# Patient Record
Sex: Female | Born: 1975
Health system: Southern US, Community
[De-identification: ages and names within clinical notes are randomized; demographics above are authoritative.]

## PROBLEM LIST (undated history)

## (undated) DIAGNOSIS — R079 Chest pain, unspecified: Secondary | ICD-10-CM

## (undated) DIAGNOSIS — R002 Palpitations: Secondary | ICD-10-CM

## (undated) DIAGNOSIS — Z8719 Personal history of other diseases of the digestive system: Secondary | ICD-10-CM

## (undated) DIAGNOSIS — R569 Unspecified convulsions: Secondary | ICD-10-CM

## (undated) DIAGNOSIS — I341 Nonrheumatic mitral (valve) prolapse: Secondary | ICD-10-CM

## (undated) DIAGNOSIS — R011 Cardiac murmur, unspecified: Secondary | ICD-10-CM

## (undated) DIAGNOSIS — Z8711 Personal history of peptic ulcer disease: Secondary | ICD-10-CM

## (undated) HISTORY — PX: TONSILLECTOMY: SUR1361

## (undated) HISTORY — DX: Personal history of other diseases of the digestive system: Z87.19

## (undated) HISTORY — DX: Palpitations: R00.2

## (undated) HISTORY — DX: Cardiac murmur, unspecified: R01.1

## (undated) HISTORY — PX: TONSILECTOMY, ADENOIDECTOMY, BILATERAL MYRINGOTOMY AND TUBES: SHX2538

## (undated) HISTORY — DX: Personal history of peptic ulcer disease: Z87.11

## (undated) HISTORY — DX: Unspecified convulsions: R56.9

## (undated) HISTORY — DX: Nonrheumatic mitral (valve) prolapse: I34.1

## (undated) HISTORY — DX: Chest pain, unspecified: R07.9

---

## 2004-09-17 ENCOUNTER — Emergency Department (HOSPITAL_COMMUNITY): Admission: EM | Admit: 2004-09-17 | Discharge: 2004-09-17 | Payer: Self-pay | Admitting: Emergency Medicine

## 2005-12-22 ENCOUNTER — Emergency Department (HOSPITAL_COMMUNITY): Admission: EM | Admit: 2005-12-22 | Discharge: 2005-12-22 | Payer: Self-pay | Admitting: Family Medicine

## 2006-05-11 ENCOUNTER — Encounter: Admission: RE | Admit: 2006-05-11 | Discharge: 2006-05-11 | Payer: Self-pay | Admitting: Family Medicine

## 2006-08-16 ENCOUNTER — Ambulatory Visit: Payer: Self-pay | Admitting: Obstetrics & Gynecology

## 2007-05-17 ENCOUNTER — Encounter: Admission: RE | Admit: 2007-05-17 | Discharge: 2007-05-17 | Payer: Self-pay | Admitting: Internal Medicine

## 2007-05-24 ENCOUNTER — Encounter: Admission: RE | Admit: 2007-05-24 | Discharge: 2007-05-24 | Payer: Self-pay | Admitting: Internal Medicine

## 2007-06-15 ENCOUNTER — Encounter (INDEPENDENT_AMBULATORY_CARE_PROVIDER_SITE_OTHER): Payer: Self-pay | Admitting: Diagnostic Radiology

## 2007-06-15 ENCOUNTER — Encounter: Admission: RE | Admit: 2007-06-15 | Discharge: 2007-06-15 | Payer: Self-pay | Admitting: Internal Medicine

## 2007-06-27 ENCOUNTER — Ambulatory Visit: Payer: Self-pay | Admitting: Obstetrics & Gynecology

## 2007-06-27 ENCOUNTER — Other Ambulatory Visit: Admission: RE | Admit: 2007-06-27 | Discharge: 2007-06-27 | Payer: Self-pay | Admitting: Obstetrics and Gynecology

## 2007-07-09 ENCOUNTER — Inpatient Hospital Stay (HOSPITAL_COMMUNITY): Admission: AD | Admit: 2007-07-09 | Discharge: 2007-07-09 | Payer: Self-pay | Admitting: Obstetrics and Gynecology

## 2007-07-09 ENCOUNTER — Ambulatory Visit: Payer: Self-pay | Admitting: Physician Assistant

## 2009-10-05 ENCOUNTER — Emergency Department (HOSPITAL_COMMUNITY): Admission: EM | Admit: 2009-10-05 | Discharge: 2009-10-05 | Payer: Self-pay | Admitting: Emergency Medicine

## 2010-02-06 ENCOUNTER — Emergency Department: Payer: Self-pay | Admitting: Unknown Physician Specialty

## 2010-03-21 ENCOUNTER — Encounter: Payer: Self-pay | Admitting: *Deleted

## 2010-05-03 ENCOUNTER — Emergency Department (HOSPITAL_COMMUNITY)
Admission: EM | Admit: 2010-05-03 | Discharge: 2010-05-03 | Disposition: A | Discharge status: Home / Self Care | Payer: 59 | Attending: Emergency Medicine | Admitting: Emergency Medicine

## 2010-05-03 DIAGNOSIS — R002 Palpitations: Secondary | ICD-10-CM | POA: Insufficient documentation

## 2010-05-03 DIAGNOSIS — R0989 Other specified symptoms and signs involving the circulatory and respiratory systems: Secondary | ICD-10-CM | POA: Insufficient documentation

## 2010-05-03 DIAGNOSIS — R0609 Other forms of dyspnea: Secondary | ICD-10-CM | POA: Insufficient documentation

## 2010-05-03 DIAGNOSIS — I059 Rheumatic mitral valve disease, unspecified: Secondary | ICD-10-CM | POA: Insufficient documentation

## 2010-05-03 LAB — POCT CARDIAC MARKERS
CKMB, poc: <1 ng/mL — ABNORMAL LOW (ref 1.0–8.0)
Myoglobin, poc: 35.6 ng/mL (ref 12–200)

## 2010-05-03 LAB — POCT I-STAT, CHEM 8
BUN: 9 mg/dL (ref 6–23)
Creatinine, Ser: 0.9 mg/dL (ref 0.4–1.2)
Glucose, Bld: 86 mg/dL (ref 70–99)
Potassium: 4 mEq/L (ref 3.5–5.1)
Sodium: 139 mEq/L (ref 135–145)
TCO2: 23 mmol/L (ref 0–100)

## 2010-05-14 LAB — POCT CARDIAC MARKERS
CKMB, poc: <1 ng/mL — ABNORMAL LOW (ref 1.0–8.0)
CKMB, poc: <1 ng/mL — ABNORMAL LOW (ref 1.0–8.0)
Myoglobin, poc: 58.5 ng/mL (ref 12–200)

## 2010-05-14 LAB — POCT I-STAT, CHEM 8
HCT: 40 % (ref 36.0–46.0)
Hemoglobin: 13.6 g/dL (ref 12.0–15.0)
Potassium: 3.8 mEq/L (ref 3.5–5.1)
Sodium: 140 mEq/L (ref 135–145)

## 2010-05-14 LAB — DIFFERENTIAL
Lymphocytes Relative: 25 % (ref 12–46)
Lymphs Abs: 1.9 10*3/uL (ref 0.7–4.0)
Neutrophils Relative %: 64 % (ref 43–77)

## 2010-05-14 LAB — CBC
Hemoglobin: 13.1 g/dL (ref 12.0–15.0)
Platelets: 233 10*3/uL (ref 150–400)
RBC: 4.3 MIL/uL (ref 3.87–5.11)
WBC: 7.4 10*3/uL (ref 4.0–10.5)

## 2010-05-14 LAB — D-DIMER, QUANTITATIVE: D-Dimer, Quant: 0.36 ug/mL-FEU (ref 0.00–0.48)

## 2010-05-14 LAB — PROTIME-INR
INR: 1.06 (ref 0.00–1.49)
Prothrombin Time: 14 seconds (ref 11.6–15.2)

## 2010-05-31 ENCOUNTER — Institutional Professional Consult (permissible substitution): Payer: Self-pay | Admitting: Cardiovascular Disease

## 2010-05-31 ENCOUNTER — Encounter: Payer: Self-pay | Admitting: *Deleted

## 2010-05-31 ENCOUNTER — Other Ambulatory Visit: Payer: Self-pay | Admitting: Cardiovascular Disease

## 2010-05-31 DIAGNOSIS — I10 Essential (primary) hypertension: Secondary | ICD-10-CM

## 2010-05-31 MED ORDER — PROPRANOLOL HCL 10 MG PO TABS: 10.0000 mg | ORAL_TABLET | Freq: Three times a day (TID) | ORAL | Status: DC | PRN

## 2010-05-31 NOTE — Telephone Encounter (Signed)
Rescheduled appt for 06/07/2010, needs script for propranolol 10mg  QID.

## 2010-05-31 NOTE — Telephone Encounter (Signed)
Patient request refill. Med refilled.

## 2010-06-03 ENCOUNTER — Encounter: Payer: Self-pay | Admitting: Cardiovascular Disease

## 2010-06-07 ENCOUNTER — Institutional Professional Consult (permissible substitution): Payer: Self-pay | Admitting: Cardiovascular Disease

## 2010-06-30 ENCOUNTER — Emergency Department (INDEPENDENT_AMBULATORY_CARE_PROVIDER_SITE_OTHER): Payer: Self-pay

## 2010-06-30 ENCOUNTER — Emergency Department (HOSPITAL_BASED_OUTPATIENT_CLINIC_OR_DEPARTMENT_OTHER): Payer: Self-pay

## 2010-06-30 ENCOUNTER — Emergency Department (HOSPITAL_BASED_OUTPATIENT_CLINIC_OR_DEPARTMENT_OTHER)
Admission: EM | Admit: 2010-06-30 | Discharge: 2010-06-30 | Disposition: A | Discharge status: Home / Self Care | Payer: Self-pay | Attending: Emergency Medicine | Admitting: Emergency Medicine

## 2010-06-30 DIAGNOSIS — M542 Cervicalgia: Secondary | ICD-10-CM

## 2010-06-30 DIAGNOSIS — R51 Headache: Secondary | ICD-10-CM

## 2010-06-30 DIAGNOSIS — Y92410 Unspecified street and highway as the place of occurrence of the external cause: Secondary | ICD-10-CM | POA: Insufficient documentation

## 2010-06-30 DIAGNOSIS — S139XXA Sprain of joints and ligaments of unspecified parts of neck, initial encounter: Secondary | ICD-10-CM | POA: Insufficient documentation

## 2010-06-30 DIAGNOSIS — S060X0A Concussion without loss of consciousness, initial encounter: Secondary | ICD-10-CM | POA: Insufficient documentation

## 2010-06-30 DIAGNOSIS — R42 Dizziness and giddiness: Secondary | ICD-10-CM

## 2010-07-22 ENCOUNTER — Emergency Department (HOSPITAL_COMMUNITY)
Admission: EM | Admit: 2010-07-22 | Discharge: 2010-07-22 | Disposition: A | Discharge status: Home / Self Care | Payer: 59 | Attending: Emergency Medicine | Admitting: Emergency Medicine

## 2010-07-22 ENCOUNTER — Telehealth: Payer: Self-pay | Admitting: Cardiovascular Disease

## 2010-07-22 ENCOUNTER — Emergency Department (HOSPITAL_COMMUNITY): Payer: 59

## 2010-07-22 DIAGNOSIS — Z79899 Other long term (current) drug therapy: Secondary | ICD-10-CM | POA: Insufficient documentation

## 2010-07-22 DIAGNOSIS — R55 Syncope and collapse: Secondary | ICD-10-CM | POA: Insufficient documentation

## 2010-07-22 DIAGNOSIS — R51 Headache: Secondary | ICD-10-CM | POA: Insufficient documentation

## 2010-07-22 DIAGNOSIS — F0781 Postconcussional syndrome: Secondary | ICD-10-CM | POA: Insufficient documentation

## 2010-07-22 DIAGNOSIS — H53149 Visual discomfort, unspecified: Secondary | ICD-10-CM | POA: Insufficient documentation

## 2010-07-22 DIAGNOSIS — Z7982 Long term (current) use of aspirin: Secondary | ICD-10-CM | POA: Insufficient documentation

## 2010-07-22 DIAGNOSIS — R112 Nausea with vomiting, unspecified: Secondary | ICD-10-CM | POA: Insufficient documentation

## 2010-07-22 LAB — URINALYSIS, ROUTINE W REFLEX MICROSCOPIC
Bilirubin Urine: NEGATIVE
Glucose, UA: NEGATIVE mg/dL
Hgb urine dipstick: NEGATIVE
Specific Gravity, Urine: 1.016 (ref 1.005–1.030)
Urobilinogen, UA: 1 mg/dL (ref 0.0–1.0)

## 2010-07-22 LAB — POCT I-STAT, CHEM 8
Calcium, Ion: 1.15 mmol/L (ref 1.12–1.32)
Chloride: 103 mEq/L (ref 96–112)
Glucose, Bld: 86 mg/dL (ref 70–99)
HCT: 39 % (ref 36.0–46.0)
Hemoglobin: 13.3 g/dL (ref 12.0–15.0)

## 2010-07-22 NOTE — Telephone Encounter (Signed)
PT CALLED SAYING NOT FEELING RIGHT, A BIT PARANOID....PASSED THE CALL ALONG TO JODETTE. SHE IS SPEAKING WITH PATIENT NOW. ALSO TOOK HER THE CHART.
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
## 2010-07-27 ENCOUNTER — Emergency Department (HOSPITAL_COMMUNITY)
Admission: EM | Admit: 2010-07-27 | Discharge: 2010-07-27 | Disposition: A | Discharge status: Home / Self Care | Payer: 59 | Attending: Emergency Medicine | Admitting: Emergency Medicine

## 2010-07-27 DIAGNOSIS — R42 Dizziness and giddiness: Secondary | ICD-10-CM | POA: Insufficient documentation

## 2010-07-27 DIAGNOSIS — R51 Headache: Secondary | ICD-10-CM | POA: Insufficient documentation

## 2010-07-27 DIAGNOSIS — R413 Other amnesia: Secondary | ICD-10-CM | POA: Insufficient documentation

## 2010-07-27 DIAGNOSIS — R110 Nausea: Secondary | ICD-10-CM | POA: Insufficient documentation

## 2010-07-27 DIAGNOSIS — F0781 Postconcussional syndrome: Secondary | ICD-10-CM | POA: Insufficient documentation

## 2010-08-23 ENCOUNTER — Encounter: Payer: Self-pay | Admitting: Neurology

## 2010-08-29 ENCOUNTER — Encounter: Payer: Self-pay | Admitting: Neurology

## 2010-09-29 ENCOUNTER — Encounter: Payer: Self-pay | Admitting: Neurology

## 2010-11-23 LAB — POCT PREGNANCY, URINE: Operator id: 159681

## 2011-02-23 ENCOUNTER — Ambulatory Visit (INDEPENDENT_AMBULATORY_CARE_PROVIDER_SITE_OTHER): Payer: Commercial Managed Care - PPO

## 2011-02-23 DIAGNOSIS — R509 Fever, unspecified: Secondary | ICD-10-CM

## 2011-02-23 DIAGNOSIS — R059 Cough, unspecified: Secondary | ICD-10-CM

## 2011-02-23 DIAGNOSIS — J111 Influenza due to unidentified influenza virus with other respiratory manifestations: Secondary | ICD-10-CM

## 2011-02-23 DIAGNOSIS — R05 Cough: Secondary | ICD-10-CM

## 2011-03-02 ENCOUNTER — Ambulatory Visit (INDEPENDENT_AMBULATORY_CARE_PROVIDER_SITE_OTHER): Payer: Commercial Managed Care - PPO

## 2011-03-02 DIAGNOSIS — R042 Hemoptysis: Secondary | ICD-10-CM

## 2011-03-02 DIAGNOSIS — F17200 Nicotine dependence, unspecified, uncomplicated: Secondary | ICD-10-CM

## 2011-03-02 DIAGNOSIS — J209 Acute bronchitis, unspecified: Secondary | ICD-10-CM

## 2011-08-11 ENCOUNTER — Encounter: Payer: Self-pay | Admitting: *Deleted

## 2011-10-29 ENCOUNTER — Ambulatory Visit (INDEPENDENT_AMBULATORY_CARE_PROVIDER_SITE_OTHER): Payer: BC Managed Care – PPO | Admitting: Internal Medicine

## 2011-10-29 VITALS — BP 105/69 | HR 64 | Temp 98.6°F | Resp 18 | Ht 67.5 in | Wt 147.0 lb

## 2011-10-29 DIAGNOSIS — M2550 Pain in unspecified joint: Secondary | ICD-10-CM

## 2011-10-29 DIAGNOSIS — R5381 Other malaise: Secondary | ICD-10-CM

## 2011-10-29 DIAGNOSIS — G2581 Restless legs syndrome: Secondary | ICD-10-CM

## 2011-10-29 DIAGNOSIS — I10 Essential (primary) hypertension: Secondary | ICD-10-CM

## 2011-10-29 DIAGNOSIS — R5383 Other fatigue: Secondary | ICD-10-CM

## 2011-10-29 DIAGNOSIS — IMO0001 Reserved for inherently not codable concepts without codable children: Secondary | ICD-10-CM

## 2011-10-29 DIAGNOSIS — M791 Myalgia, unspecified site: Secondary | ICD-10-CM

## 2011-10-29 DIAGNOSIS — G4700 Insomnia, unspecified: Secondary | ICD-10-CM

## 2011-10-29 DIAGNOSIS — I341 Nonrheumatic mitral (valve) prolapse: Secondary | ICD-10-CM

## 2011-10-29 LAB — POCT CBC
Granulocyte percent: 66.5 %G (ref 37–80)
Lymph, poc: 1.7 (ref 0.6–3.4)
MCH, POC: 27.9 pg (ref 27–31.2)
MCHC: 29.9 g/dL — AB (ref 31.8–35.4)
MCV: 93.4 fL (ref 80–97)
MID (cbc): 0.4 (ref 0–0.9)
POC LYMPH PERCENT: 27.1 %L (ref 10–50)
Platelet Count, POC: 254 10*3/uL (ref 142–424)
RDW, POC: 13.9 %

## 2011-10-29 LAB — POCT UA - MICROSCOPIC ONLY
Casts, Ur, LPF, POC: NEGATIVE
Crystals, Ur, HPF, POC: NEGATIVE
Yeast, UA: NEGATIVE

## 2011-10-29 LAB — COMPREHENSIVE METABOLIC PANEL
ALT: 23 U/L (ref 0–35)
AST: 16 U/L (ref 0–37)
BUN: 11 mg/dL (ref 6–23)
Calcium: 9.3 mg/dL (ref 8.4–10.5)
Creat: 0.81 mg/dL (ref 0.50–1.10)
Total Bilirubin: 0.4 mg/dL (ref 0.3–1.2)

## 2011-10-29 LAB — POCT URINALYSIS DIPSTICK
Bilirubin, UA: NEGATIVE
Blood, UA: NEGATIVE
Ketones, UA: NEGATIVE
Protein, UA: NEGATIVE
Spec Grav, UA: 1.015
pH, UA: 8

## 2011-10-29 LAB — TSH: TSH: 0.578 u[IU]/mL (ref 0.350–4.500)

## 2011-10-29 MED ORDER — PROPRANOLOL HCL 10 MG PO TABS: 10.0000 mg | ORAL_TABLET | Freq: Four times a day (QID) | ORAL | Status: DC

## 2011-10-29 MED ORDER — CLONAZEPAM 0.5 MG PO TABS: 0.5000 mg | ORAL_TABLET | Freq: Every evening | ORAL | Status: DC | PRN

## 2011-10-29 NOTE — Progress Notes (Signed)
Subjective:    Patient ID: Kathryn Brown, female    DOB: 01-Jan-1976, 36 y.o.   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-hour job and she cannot cut back in any way Toss and turn- restless legs-wakes nonrestored/No snoring or apnea Lightheadedness/No syncope/uncertain of postural changes Associated Painful skin/All over  [place]-But no time to get there Review of Systems dysmenorrhea   cycles diarr &constipation Urinary freq Objective:   Physical Exam In no acute distress Vital signs stable HEENT clear No thyromegaly or lymphadenopathy Lungs clear Heart regular without murmurs/loud midsystolic click that moves with respiration Abdomen soft without organomegaly or masses Extremities with no sensory or motor losses and full peripheral pulses She is generally tender in the large muscles of the legs and back Joints have a full range of motion without swelling or redness Neurological is intact       Results for orders placed in visit on 10/29/11  POCT CBC      Component Value Range   WBC 6.3  4.6 - 10.2 K/uL   Lymph, poc 1.7  0.6 - 3.4   POC LYMPH PERCENT 27.1  10 - 50 %L   MID (cbc) 0.4  0 - 0.9   POC MID % 6.4  0 - 12 %M   POC Granulocyte 4.2  2 - 6.9   Granulocyte percent 66.5  37 - 80 %G   RBC 4.37  4.04 - 5.48 M/uL   Hemoglobin 12.2  12.2 - 16.2 g/dL   HCT, POC 40.9  81.1 - 47.9 %   MCV 93.4  80 - 97 fL   MCH, POC 27.9  27 - 31.2 pg   MCHC 29.9 (*) 31.8 - 35.4 g/dL   RDW, POC 91.4     Platelet Count, POC 254  142 - 424 K/uL   MPV 9.9  0 - 99.8 fL  POCT URINALYSIS DIPSTICK      Component Value Range   Color, UA yellow     Clarity, UA clear       Glucose, UA neg     Bilirubin, UA neg     Ketones, UA neg     Spec Grav, UA 1.015     Blood, UA neg     pH, UA 8.0     Protein, UA neg     Urobilinogen, UA 0.2     Nitrite, UA neg     Leukocytes, UA small (1+)    POCT UA - MICROSCOPIC ONLY      Component Value Range   WBC, Ur, HPF, POC 2-3     RBC, urine, microscopic 0-2     Bacteria, U Microscopic small     Mucus, UA trace     Epithelial cells, urine per micros 1-3     Crystals, Ur, HPF, POC neg     Casts, Ur, LPF, POC neg     Yeast, UA neg      Assessment & Plan:   1. Fatigue  POCT CBC, POCT SEDIMENTATION RATE, POCT urinalysis dipstick, POCT UA - Microscopic Only, Comprehensive metabolic panel, TSH  2. Myalgia  POCT CBC, POCT SEDIMENTATION RATE, POCT urinalysis dipstick, POCT UA - Microscopic Only, Comprehensive  metabolic panel, TSH  3. Arthralgia  POCT CBC, POCT SEDIMENTATION RATE, POCT urinalysis dipstick, POCT UA - Microscopic Only, Comprehensive metabolic panel, TSH  4. HTN (hypertension)  propranolol (INDERAL) 10 MG tablet   Doubt diagnosis/probably on propranolol for mitral valve prolapse  5. MVP (mitral valve prolapse)    6.  Insomnia/restless legs  For now the focus will be on adequate sleep using Clonopin/Awaiting lab results

## 2011-10-30 DIAGNOSIS — I341 Nonrheumatic mitral (valve) prolapse: Secondary | ICD-10-CM

## 2011-10-30 HISTORY — DX: Nonrheumatic mitral (valve) prolapse: I34.1

## 2011-10-31 ENCOUNTER — Encounter: Payer: Self-pay | Admitting: Internal Medicine

## 2011-11-02 ENCOUNTER — Emergency Department (HOSPITAL_COMMUNITY): Payer: BC Managed Care – PPO

## 2011-11-02 ENCOUNTER — Ambulatory Visit (INDEPENDENT_AMBULATORY_CARE_PROVIDER_SITE_OTHER): Payer: BC Managed Care – PPO | Admitting: Family Medicine

## 2011-11-02 ENCOUNTER — Emergency Department (HOSPITAL_COMMUNITY)
Admission: EM | Admit: 2011-11-02 | Discharge: 2011-11-02 | Disposition: A | Discharge status: Home / Self Care | Payer: BC Managed Care – PPO | Attending: Emergency Medicine | Admitting: Emergency Medicine

## 2011-11-02 ENCOUNTER — Encounter (HOSPITAL_COMMUNITY): Payer: Self-pay | Admitting: *Deleted

## 2011-11-02 VITALS — BP 108/70 | HR 79 | Resp 18

## 2011-11-02 DIAGNOSIS — R079 Chest pain, unspecified: Secondary | ICD-10-CM | POA: Insufficient documentation

## 2011-11-02 DIAGNOSIS — R55 Syncope and collapse: Secondary | ICD-10-CM | POA: Insufficient documentation

## 2011-11-02 DIAGNOSIS — R410 Disorientation, unspecified: Secondary | ICD-10-CM

## 2011-11-02 DIAGNOSIS — F29 Unspecified psychosis not due to a substance or known physiological condition: Secondary | ICD-10-CM

## 2011-11-02 DIAGNOSIS — F17200 Nicotine dependence, unspecified, uncomplicated: Secondary | ICD-10-CM | POA: Insufficient documentation

## 2011-11-02 DIAGNOSIS — H547 Unspecified visual loss: Secondary | ICD-10-CM

## 2011-11-02 DIAGNOSIS — R0602 Shortness of breath: Secondary | ICD-10-CM | POA: Insufficient documentation

## 2011-11-02 DIAGNOSIS — R071 Chest pain on breathing: Secondary | ICD-10-CM | POA: Insufficient documentation

## 2011-11-02 LAB — CBC WITH DIFFERENTIAL/PLATELET
Basophils Absolute: 0 10*3/uL (ref 0.0–0.1)
Basophils Relative: 0 % (ref 0–1)
Eosinophils Absolute: 0.2 10*3/uL (ref 0.0–0.7)
Eosinophils Relative: 2 % (ref 0–5)
HCT: 37.5 % (ref 36.0–46.0)
Hemoglobin: 12.5 g/dL (ref 12.0–15.0)
Lymphocytes Relative: 17 % (ref 12–46)
Lymphs Abs: 2.1 10*3/uL (ref 0.7–4.0)
MCH: 29.4 pg (ref 26.0–34.0)
MCHC: 33.3 g/dL (ref 30.0–36.0)
MCV: 88.2 fL (ref 78.0–100.0)
Monocytes Absolute: 0.7 10*3/uL (ref 0.1–1.0)
Monocytes Relative: 5 % (ref 3–12)
Neutro Abs: 9.5 10*3/uL — ABNORMAL HIGH (ref 1.7–7.7)
Neutrophils Relative %: 76 % (ref 43–77)
Platelets: 222 10*3/uL (ref 150–400)
RBC: 4.25 MIL/uL (ref 3.87–5.11)
RDW: 13 % (ref 11.5–15.5)
WBC: 12.5 10*3/uL — ABNORMAL HIGH (ref 4.0–10.5)

## 2011-11-02 LAB — RAPID URINE DRUG SCREEN, HOSP PERFORMED
Amphetamines: NOT DETECTED
Barbiturates: NOT DETECTED
Benzodiazepines: NOT DETECTED
Cocaine: NOT DETECTED
Opiates: NOT DETECTED
Tetrahydrocannabinol: NOT DETECTED

## 2011-11-02 LAB — COMPREHENSIVE METABOLIC PANEL
ALT: 19 U/L (ref 0–35)
AST: 17 U/L (ref 0–37)
Albumin: 3.5 g/dL (ref 3.5–5.2)
Alkaline Phosphatase: 78 U/L (ref 39–117)
BUN: 14 mg/dL (ref 6–23)
CO2: 24 mEq/L (ref 19–32)
Calcium: 9.2 mg/dL (ref 8.4–10.5)
Chloride: 104 mEq/L (ref 96–112)
Creatinine, Ser: 1.05 mg/dL (ref 0.50–1.10)
GFR calc Af Amer: 78 mL/min — ABNORMAL LOW (ref >90)
GFR calc non Af Amer: 67 mL/min — ABNORMAL LOW (ref >90)
Glucose, Bld: 84 mg/dL (ref 70–99)
Potassium: 4.2 mEq/L (ref 3.5–5.1)
Sodium: 137 mEq/L (ref 135–145)
Total Bilirubin: 0.2 mg/dL — ABNORMAL LOW (ref 0.3–1.2)
Total Protein: 6.7 g/dL (ref 6.0–8.3)

## 2011-11-02 LAB — URINALYSIS, ROUTINE W REFLEX MICROSCOPIC
Bilirubin Urine: NEGATIVE
Glucose, UA: NEGATIVE mg/dL
Hgb urine dipstick: NEGATIVE
Ketones, ur: NEGATIVE mg/dL
Nitrite: NEGATIVE
Protein, ur: NEGATIVE mg/dL
Specific Gravity, Urine: 1.012 (ref 1.005–1.030)
Urobilinogen, UA: 0.2 mg/dL (ref 0.0–1.0)
pH: 7 (ref 5.0–8.0)

## 2011-11-02 LAB — POCT PREGNANCY, URINE: Preg Test, Ur: NEGATIVE

## 2011-11-02 LAB — URINE MICROSCOPIC-ADD ON

## 2011-11-02 LAB — POCT I-STAT TROPONIN I: Troponin i, poc: 0 ng/mL (ref 0.00–0.08)

## 2011-11-02 MED ORDER — SODIUM CHLORIDE 0.9 % IV BOLUS (SEPSIS)
1000.0000 mL | Freq: Once | INTRAVENOUS | Status: AC
  Administered 2011-11-02: 1000 mL via INTRAVENOUS

## 2011-11-02 NOTE — Progress Notes (Signed)
[facility]:  Office Visit  Chief Complaint:  Chief Complaint  Patient presents with  . Loss of Consciousness  . Chest Pain    HPI: Kathryn Brown is a 36 y.o. female who complains of:    Acute onset of  2 complete syncopal episodes lasting several seconds  this AM and also multiple episodes of dizziness or pre-syncope. Followed by confusion. She had LOC which were all unwitnessed. This was associated with intermittent sharp substernal CP with radiation to right arm and numbness of bilateral hands, generalized weakness , nausea, vomiting. Last episode of pre-syncope she had bilateral vision loss but was still able to hear. Denies any h/o HTN, hyperlipidemia, diabetes, seizures, migraine HAs. + FH of CVA/TIa paternal GF in his 71s.  Denies loss of bowel or bladder function, tongue biting. Yesterday per coworker at bedside she was dizzy, incoherent and had gait changes.   No prior history of this ever happening.  She has a history of MVP, on Propranolol for years. She recently was put on Klonopin for insomnia/restless leg  Past Medical History  Diagnosis Date  . History of stomach ulcers     as a child  . Heart palpitations   . Chest pain   . Heart murmur     as a child   Past Surgical History  Procedure Date  . Tonsilectomy, adenoidectomy, bilateral myringotomy and tubes    History   Social History  . Marital Status: Single    Spouse Name: N/A    Number of Children: N/A  . Years of Education: N/A   Social History Main Topics  . Smoking status: Current Some Day Smoker    Types: Cigarettes  . Smokeless tobacco: Never Used   Comment: occasionally smokes a cigarette  . Alcohol Use: Yes  . Drug Use: No  . Sexually Active: None   Other Topics Concern  . None   Social History Narrative  . None   Family History  Problem Relation Age of Onset  . Heart disease Mother   . Hypertension Mother   . Heart disease Father   . Hypertension Father   .  Diabetes Mother    Allergies  Allergen Reactions  . Sulfa Antibiotics    Prior to Admission medications   Medication Sig Start Date End Date Taking? Authorizing Provider  clonazePAM (KLONOPIN) 0.5 MG tablet Take 1 tablet (0.5 mg total) by mouth at bedtime as needed for anxiety. 10/29/11 11/28/11  Tonye Pearson, MD  propranolol (INDERAL) 10 MG tablet Take 1 tablet (10 mg total) by mouth 4 (four) times daily. 10/29/11   Tonye Pearson, MD     ROS: The patient denies fevers, chills, night sweats, unintentional weight loss, palpitations, wheezing, dyspnea on exertion, abdominal pain, dysuria, hematuria, melena.  All other systems have been reviewed and were otherwise negative with the exception of those mentioned in the HPI and as above.    PHYSICAL EXAM: Filed Vitals:   11/02/11 1821  BP: 108/70  Pulse: 79  Resp:    There were no vitals filed for this visit. There is no height or weight on file to calculate BMI.  General: Alert, no acute distress HEENT:  Normocephalic, atraumatic, oropharynx patent. EOMI, PERRLA, no nystagmus Cardiovascular:  Regular rate and rhythm, no rubs murmurs or gallops.  No Carotid bruits, radial pulse intact. No pedal edema.  Respiratory: Clear to auscultation bilaterally.  No wheezes, rales, or rhonchi.  No cyanosis, no use of accessory  musculature GI: No organomegaly, abdomen is soft and non-tender, positive bowel sounds.  No masses. Skin: No rashes. Neurologic: Facial musculature symmetric. Psychiatric: Patient is appropriate throughout our interaction. Lymphatic: No cervical lymphadenopathy Musculoskeletal: weak, 4/5 strength UE and Nadia Viar bilateral   LABS: Results for orders placed in visit on 11/02/11  GLUCOSE, POCT (MANUAL RESULT ENTRY)      Component Value Range   POC Glucose 123 (*) 70 - 99 mg/dl     EKG/XRAY:   Primary read interpreted by Dr. Conley Rolls at [facility]. EKG SR at 77 bpm, no ST  wave elevation or  depression   ASSESSMENT/PLAN: Encounter Diagnoses  Name Primary?  . Syncope Yes  . Chest pain     36 y/o white female with  2 unwitnessed episodes of LOC and multiple episodes of dizziness and pre-syncope, h/o MVP, associated with CP, nausea, vomiting, intermittent loss of vision bilaterally.  -will send to [facility] ER for further evaluation, spoke with ER charge nurse   Rockne Coons, DO 11/02/2011 6:46 PM

## 2011-11-02 NOTE — ED Notes (Signed)
New and old EKG handed to Dr. Juleen China.  Extra copies of both placed in pt chart.  Pt assisted with bedpan immediately upon arrival.  Call light within reach.  Both bed rails up, bed locked and in lowest position. Family members on their way.  Warm blankets given

## 2011-11-02 NOTE — ED Notes (Signed)
Pt in via EMS, per EMS- pt in from urgent care, pt seen there after syncopal episodes x2 today, also generalized weakness, chest wall pain, IV started at urgent care, 20g in LAC, CBG 146

## 2011-11-02 NOTE — ED Provider Notes (Signed)
History    36 year old female with syncope. Patient had 2 syncopal event today. There were no exertional. Patient with generalized female ACE. Triage note was reviewed, the patient denied chest pain to me. Shortness of breath. No fevers or chills. Occasionally drinks red blood, but no significant caffeine intake today. History mitral valve prolapse which she takes propanolol 4. No fevers or chills. No unusual leg pain or swelling. Denies history of blood clot.  CSN: [id number]  Arrival date & time 11/02/11  1929   First MD Initiated Contact with Patient 11/02/11 2018      Chief Complaint  Patient presents with  . Weakness  . Chest wall pain     (Consider location/radiation/quality/duration/timing/severity/associated sxs/prior treatment) HPI  Past Medical History  Diagnosis Date  . History of stomach ulcers     as a child  . Heart palpitations   . Chest pain   . Heart murmur     as a child    Past Surgical History  Procedure Date  . Tonsilectomy, adenoidectomy, bilateral myringotomy and tubes     Family History  Problem Relation Age of Onset  . Heart disease Mother   . Hypertension Mother   . Heart disease Father   . Hypertension Father   . Diabetes Mother     History  Substance Use Topics  . Smoking status: Current Some Day Smoker    Types: Cigarettes  . Smokeless tobacco: Never Used   Comment: occasionally smokes a cigarette  . Alcohol Use: Yes    OB History    Grav Para Term Preterm Abortions TAB SAB Ect Mult Living                  Review of Systems   Review of symptoms negative unless otherwise noted in HPI.   Allergies  Sulfa antibiotics  Home Medications   Current Outpatient Rx  Name Route Sig Dispense Refill  . CLONAZEPAM 0.5 MG PO TABS Oral Take 1 tablet (0.5 mg total) by mouth at bedtime as needed for anxiety. 30 tablet 2  . PROPRANOLOL HCL 10 MG PO TABS Oral Take 1 tablet (10 mg total) by mouth 4 (four) times daily. 120 tablet 11    BP  105/57  Pulse 67  Temp 98.6 F (37 C) (Oral)  Resp 14  SpO2 99%  LMP 10/15/2011  Physical Exam  Nursing note and vitals reviewed. Constitutional: She is oriented to person, place, and time. She appears well-developed and well-nourished. No distress.  HENT:  Head: Normocephalic and atraumatic.  Eyes: Conjunctivae are normal. Right eye exhibits no discharge. Left eye exhibits no discharge.  Neck: Neck supple.  Cardiovascular: Normal rate, regular rhythm and normal heart sounds.  Exam reveals no gallop and no friction rub.   No murmur heard. Pulmonary/Chest: Effort normal and breath sounds normal. No respiratory distress.  Abdominal: Soft. She exhibits no distension. There is no tenderness.  Musculoskeletal: She exhibits no edema and no tenderness.       Lower extremities symmetric as compared to each other. No calf tenderness. Negative Homan's. No palpable cords.   Neurological: She is alert and oriented to person, place, and time. No cranial nerve deficit. She exhibits normal muscle tone. Coordination normal.  Skin: Skin is warm and dry.  Psychiatric: She has a normal mood and affect. Her behavior is normal. Thought content normal.    ED Course  Procedures (including critical care time)  Labs Reviewed  CBC WITH DIFFERENTIAL - Abnormal; Notable for the  following:    WBC 12.5 (*)     Neutro Abs 9.5 (*)     All other components within normal limits  COMPREHENSIVE METABOLIC PANEL - Abnormal; Notable for the following:    Total Bilirubin 0.2 (*)     GFR calc non Af Amer 67 (*)     GFR calc Af Amer 78 (*)     All other components within normal limits  URINALYSIS, ROUTINE W REFLEX MICROSCOPIC - Abnormal; Notable for the following:    APPearance HAZY (*)     Leukocytes, UA MODERATE (*)     All other components within normal limits  URINE MICROSCOPIC-ADD ON - Abnormal; Notable for the following:    Squamous Epithelial / LPF MANY (*)     Bacteria, UA FEW (*)     All other  components within normal limits  URINE RAPID DRUG SCREEN (HOSP PERFORMED)  POCT PREGNANCY, URINE  POCT I-STAT TROPONIN I   Dg Chest 2 View  11/02/2011  *RADIOLOGY REPORT*  Clinical Data: Chest pain and shortness of breath  CHEST - 2 VIEW  Comparison: [facility] chest x-ray from 03/02/2011  Findings: The lungs are clear without focal infiltrate, edema, pneumothorax or pleural effusion. The cardiopericardial silhouette is within normal limits for size. Imaged bony structures of the thorax are intact.  IMPRESSION: No acute cardiopulmonary process.   Original Report Authenticated By: ERIC A. MANSELL, M.D.    EKG:  Rhythm: Normal sinus Vent. rate 79 BPM PR interval 172 ms QRS duration 92 ms QT/QTc 376/431 ms Axis: Normal Intervals: Normal ST segments: Nonspecific ST changes in lead V2 and V3   1. Syncope       MDM  36 year old female with syncope. EKG with normal intervals. Patient is on propanolol for history of mitral valve prolapse. She did have some mild hypotension on arrival. This may be the reason for syncopal event. Patient instructed to decrease her propanolol dosing until she can followup. She reports feeling better after a bolus of IV fluids. Workup is fairly unremarkable. Feel patient is safe for discharge at this time. Discussed with patient the importance of outpatient followup to discuss with her PCP about  Holter monitoring.        Raeford Razor, MD 11/08/11 2225

## 2011-11-03 ENCOUNTER — Other Ambulatory Visit: Payer: Self-pay | Admitting: *Deleted

## 2011-11-03 ENCOUNTER — Telehealth: Payer: Self-pay | Admitting: Cardiovascular Disease

## 2011-11-03 DIAGNOSIS — R002 Palpitations: Secondary | ICD-10-CM

## 2011-11-03 NOTE — Telephone Encounter (Signed)
Please return call to patient [phone]  Pt wants to sched heart monitor, but the DC papers do not specify Type of monitor.  Please advise and forward info to [facility] tech to expedite.

## 2011-11-03 NOTE — Telephone Encounter (Signed)
Order put in computer for 21 day event monitor--pt notified ruth will call her when monitor becomes available

## 2011-11-07 ENCOUNTER — Telehealth: Payer: Self-pay

## 2011-11-07 NOTE — Telephone Encounter (Signed)
Called Marcos Eke, pt wanted monitor mailed to her, done per ruth.

## 2011-11-07 NOTE — Telephone Encounter (Signed)
The patient called to request that doctor's note be faxed to her employer at [phone] attn: Craig Staggers.  The patient needs the note for her visit on 11/02/11 when she was seen for chest pain and sent out to [facility] by ambulance.  Please call the patient at [phone] with any questions regarding the note.

## 2011-11-07 NOTE — Telephone Encounter (Signed)
Pt notified and note faxed.

## 2011-11-08 NOTE — Telephone Encounter (Signed)
MONITOR WILL BE MAIL TO PATIENT PER PATIENT

## 2011-12-19 ENCOUNTER — Telehealth: Payer: Self-pay | Admitting: *Deleted

## 2011-12-19 NOTE — Telephone Encounter (Signed)
msg left of non serious life watch monitor results, told to call with questions or concerns or to make an app.

## 2012-05-09 ENCOUNTER — Encounter: Payer: Self-pay | Admitting: Cardiovascular Disease

## 2014-04-29 ENCOUNTER — Emergency Department: Payer: Self-pay | Admitting: Emergency Medicine

## 2015-09-29 DIAGNOSIS — H16142 Punctate keratitis, left eye: Secondary | ICD-10-CM | POA: Diagnosis not present

## 2015-10-01 DIAGNOSIS — H16142 Punctate keratitis, left eye: Secondary | ICD-10-CM | POA: Diagnosis not present

## 2016-01-08 ENCOUNTER — Ambulatory Visit (HOSPITAL_COMMUNITY)
Admission: EM | Admit: 2016-01-08 | Discharge: 2016-01-08 | Disposition: A | Discharge status: Home / Self Care | Payer: BLUE CROSS/BLUE SHIELD | Attending: Emergency Medicine | Admitting: Emergency Medicine

## 2016-01-08 ENCOUNTER — Encounter (HOSPITAL_COMMUNITY): Payer: Self-pay | Admitting: Emergency Medicine

## 2016-01-08 DIAGNOSIS — R51 Headache: Secondary | ICD-10-CM

## 2016-01-08 DIAGNOSIS — R110 Nausea: Secondary | ICD-10-CM

## 2016-01-08 DIAGNOSIS — J069 Acute upper respiratory infection, unspecified: Secondary | ICD-10-CM | POA: Diagnosis not present

## 2016-01-08 DIAGNOSIS — B9789 Other viral agents as the cause of diseases classified elsewhere: Secondary | ICD-10-CM

## 2016-01-08 DIAGNOSIS — R519 Headache, unspecified: Secondary | ICD-10-CM

## 2016-01-08 MED ORDER — KETOROLAC TROMETHAMINE 60 MG/2ML IM SOLN
45.0000 mg | Freq: Once | INTRAMUSCULAR | Status: AC
  Administered 2016-01-08: 45 mg via INTRAMUSCULAR

## 2016-01-08 MED ORDER — ALBUTEROL SULFATE HFA 108 (90 BASE) MCG/ACT IN AERS: 2.0000 | INHALATION_SPRAY | RESPIRATORY_TRACT | 0 refills | Status: DC | PRN

## 2016-01-08 MED ORDER — KETOROLAC TROMETHAMINE 60 MG/2ML IM SOLN
INTRAMUSCULAR | Status: AC
  Filled 2016-01-08: qty 2

## 2016-01-08 MED ORDER — ONDANSETRON 4 MG PO TBDP
4.0000 mg | ORAL_TABLET | Freq: Once | ORAL | Status: AC
  Administered 2016-01-08: 4 mg via ORAL

## 2016-01-08 MED ORDER — ONDANSETRON 4 MG PO TBDP
ORAL_TABLET | ORAL | Status: AC
  Filled 2016-01-08: qty 1

## 2016-01-08 NOTE — Discharge Instructions (Signed)
You may use saline nasal spray frequently for nasal congestion. Since you are unable to take antihistamines and decongestants there are very few alternatives for congestion. Strongly suggest that you follow up with primary care provider as soon as possible. If your headache is worse or if they have new symptoms or problems recommend going to the emergency department.

## 2016-01-08 NOTE — ED Provider Notes (Signed)
CSN: [id number]     Arrival date & time 01/08/16  1704 History   First MD Initiated Contact with Patient 01/08/16 1744     Chief Complaint  Patient presents with  . Headache   (Consider location/radiation/quality/duration/timing/severity/associated sxs/prior Treatment) 40 year old female is accompanied by her daughter has a myriad of symptoms for the past 2 months that are off and on. She complains of having a bad cold for over 2 weeks. The cold consist of sniffles, runny nose, headache, cough occasional double vision. She has taken no medications for the symptoms as she has several apparent conditions that she states keep her from taking these medications. She has had a headache off and on for one month. It is a little worse today. Apparently 2-3 weeks ago she had several episodes of vomiting that has since abated. Now she has some nausea with no vomiting. At some point in time in the past couple of months she has had dizziness and when moving her eyes rapidly this creates vertigo. It was very difficult to obtain a history in that her symptoms and timing or scattered about and difficult to understand. She states that she has a very stressful job. The headache is bitemporal and frontal. She is a smoker. It is noteworthy that even a she states she has a very bad cold that she has exhibited no cold symptoms such as cough, sniffles or other problems while speaking in the exam room. He exhibits some characteristics of logorrhea.       Past Medical History:  Diagnosis Date  . Chest pain   . Heart murmur    as a child  . Heart palpitations   . History of stomach ulcers    as a child   Past Surgical History:  Procedure Laterality Date  . TONSILECTOMY, ADENOIDECTOMY, BILATERAL MYRINGOTOMY AND TUBES     Family History  Problem Relation Age of Onset  . Heart disease Mother   . Hypertension Mother   . Heart disease Father   . Hypertension Father   . Diabetes Mother    Social History   Substance Use Topics  . Smoking status: Current Some Day Smoker    Types: Cigarettes  . Smokeless tobacco: Never Used     Comment: occasionally smokes a cigarette  . Alcohol use Yes   OB History    No data available     Review of Systems  Constitutional: Positive for activity change, appetite change and fatigue. Negative for chills and fever.  HENT: Positive for congestion, postnasal drip, rhinorrhea, sinus pressure and sore throat. Negative for facial swelling and voice change.   Eyes: Negative.   Respiratory: Positive for cough.   Cardiovascular: Negative.   Gastrointestinal: Positive for nausea. Negative for abdominal pain.  Genitourinary: Negative.   Musculoskeletal: Negative for neck pain and neck stiffness.  Skin: Negative for pallor and rash.  Neurological: Positive for dizziness and headaches. Negative for syncope and speech difficulty.  Psychiatric/Behavioral: Positive for decreased concentration. The patient is nervous/anxious.     Allergies  Sulfa antibiotics  Home Medications   Prior to Admission medications   Medication Sig Start Date End Date Taking? Authorizing Provider  propranolol (INDERAL) 10 MG tablet Take 1 tablet (10 mg total) by mouth 4 (four) times daily. 10/29/11  Yes Leandrew Koyanagi, MD  albuterol (PROVENTIL HFA;VENTOLIN HFA) 108 (90 Base) MCG/ACT inhaler Inhale 2 puffs into the lungs every 4 (four) hours as needed for wheezing or shortness of breath. 01/08/16   Shanon Brow  Khaliah Barnick, NP   Meds Ordered and Administered this Visit   Medications  ondansetron (ZOFRAN-ODT) disintegrating tablet 4 mg (not administered)  ketorolac (TORADOL) injection 45 mg (45 mg Intramuscular Given 01/08/16 1825)    BP 112/73 (BP Location: Left Arm)   Pulse 77   Temp 98.5 F (36.9 C) (Oral)   Resp 16   LMP 01/08/2016 (Exact Date)   SpO2 98%  No data found.   Physical Exam  Constitutional: She is oriented to person, place, and time. She appears well-developed and  well-nourished. No distress.  HENT:  Head: Normocephalic and atraumatic.  Nose: Nose normal.  Bilateral TMs are normal. Mild retraction bilaterally. No erythema or effusion. Oropharynx with slight erythema otherwise clear. No exudates or swelling.  Eyes: EOM are normal. Pupils are equal, round, and reactive to light.  Even with low light the patient has difficulty keeping her eyes open. However when distracted the light does not appear to be bothersome. Observing the patient during the exam shows that she will look left right up and down without difficulty however when having the patient follow my finger she states any attempt to do this will produce severe vertigo and passing out episodes. When asked to move her eyes up and down, right and left without looking at my finger she is able to do this easily and without difficulty.  Neck: Normal range of motion. Neck supple.  Cardiovascular: Normal rate, regular rhythm, normal heart sounds and intact distal pulses.   Pulmonary/Chest: Effort normal.  Faint end-expiratory wheeze. Cough associated with deep inspiration.  Abdominal: Soft. There is no tenderness.  Musculoskeletal: Normal range of motion. She exhibits no edema.  Lymphadenopathy:    She has no cervical adenopathy.  Neurological: She is alert and oriented to person, place, and time.  Attempts to have her perform finger to nose works well as long as there is no movement of the finger. The patient declines to attempt moving her eyes left and right to follow the finger because of the expectation of vertigo. Again, removing my finger and asking her to look left and right up and down without looking at my finger she is completely capable and without associated symptoms.  Skin: Skin is warm and dry.  Psychiatric: Her mood appears anxious. Her speech is tangential. She expresses impulsivity.  Patient seems to have some problem with coordinating thoughts. Reactions to simple stimuli appear  histrionic. No actual neurologic deficit is measured. She is often laughing at times, shows no apparent deficits, moves all extremities well with coordination.   Nursing note and vitals reviewed.   Urgent Care Course   Clinical Course     Procedures (including critical care time)  Labs Review Labs Reviewed - No data to display  Imaging Review No results found.   Visual Acuity Review  Right Eye Distance:   Left Eye Distance:   Bilateral Distance:    Right Eye Near:   Left Eye Near:    Bilateral Near:         MDM   1. Viral upper respiratory tract infection   2. Nonintractable episodic headache, unspecified headache type   3. Nausea    You may use saline nasal spray frequently for nasal congestion. Since you are unable to take antihistamines and decongestants there are very few alternatives for congestion. Strongly suggest that you follow up with primary care provider as soon as possible. If your headache is worse or if they have new symptoms or problems recommend going to  the emergency department. Meds ordered this encounter  Medications  . ketorolac (TORADOL) injection 45 mg  . ondansetron (ZOFRAN-ODT) disintegrating tablet 4 mg  . albuterol (PROVENTIL HFA;VENTOLIN HFA) 108 (90 Base) MCG/ACT inhaler    Sig: Inhale 2 puffs into the lungs every 4 (four) hours as needed for wheezing or shortness of breath.    Dispense:  1 Inhaler    Refill:  0    Order Specific Question:   Supervising Provider    Answer:   Melony Overly [id number]   The patient's behavior and speech suggests that she has an underlying mental health disorder. No findings   at this time that suggest an urgent neurologic condition. Her history is somewhat scattered about and is difficult to reach any sort of pattern or findings that match her complaints. It is recommended that she follow-up with a PCP as soon as possible. And that if she becomes worse or develops new symptoms to go to the emergency department  or otherwise see PCP.    Janne Napoleon, NP 01/08/16 [phone]

## 2016-01-08 NOTE — ED Triage Notes (Signed)
[facility] with a complaint of a headache and chest wall pain.   The patient stated that she has had a headache with photosensitivity that started today. The patient also reported chest wall pain from a cough that she has had for 1 week.

## 2016-07-28 ENCOUNTER — Emergency Department (HOSPITAL_COMMUNITY): Payer: BLUE CROSS/BLUE SHIELD

## 2016-07-28 ENCOUNTER — Emergency Department (HOSPITAL_COMMUNITY)
Admission: EM | Admit: 2016-07-28 | Discharge: 2016-07-28 | Disposition: A | Discharge status: Home / Self Care | Payer: BLUE CROSS/BLUE SHIELD | Attending: Emergency Medicine | Admitting: Emergency Medicine

## 2016-07-28 ENCOUNTER — Encounter (HOSPITAL_COMMUNITY): Payer: Self-pay

## 2016-07-28 DIAGNOSIS — Z7982 Long term (current) use of aspirin: Secondary | ICD-10-CM | POA: Insufficient documentation

## 2016-07-28 DIAGNOSIS — F17210 Nicotine dependence, cigarettes, uncomplicated: Secondary | ICD-10-CM | POA: Insufficient documentation

## 2016-07-28 DIAGNOSIS — Z79899 Other long term (current) drug therapy: Secondary | ICD-10-CM | POA: Diagnosis not present

## 2016-07-28 DIAGNOSIS — R079 Chest pain, unspecified: Secondary | ICD-10-CM | POA: Insufficient documentation

## 2016-07-28 DIAGNOSIS — R55 Syncope and collapse: Secondary | ICD-10-CM

## 2016-07-28 DIAGNOSIS — R42 Dizziness and giddiness: Secondary | ICD-10-CM | POA: Diagnosis not present

## 2016-07-28 LAB — I-STAT BETA HCG BLOOD, ED (MC, WL, AP ONLY): HCG, QUANTITATIVE: 5.4 m[IU]/mL — AB (ref <5)

## 2016-07-28 LAB — CBC
HCT: 42.6 % (ref 36.0–46.0)
Hemoglobin: 14.1 g/dL (ref 12.0–15.0)
MCH: 30.5 pg (ref 26.0–34.0)
MCHC: 33.1 g/dL (ref 30.0–36.0)
MCV: 92.2 fL (ref 78.0–100.0)
PLATELETS: 238 10*3/uL (ref 150–400)
RBC: 4.62 MIL/uL (ref 3.87–5.11)
RDW: 13.3 % (ref 11.5–15.5)
WBC: 10.7 10*3/uL — ABNORMAL HIGH (ref 4.0–10.5)

## 2016-07-28 LAB — BASIC METABOLIC PANEL
Anion gap: 7 (ref 5–15)
BUN: 11 mg/dL (ref 6–20)
CHLORIDE: 105 mmol/L (ref 101–111)
CO2: 24 mmol/L (ref 22–32)
CREATININE: 0.82 mg/dL (ref 0.44–1.00)
Calcium: 9.3 mg/dL (ref 8.9–10.3)
GFR calc Af Amer: >60 mL/min (ref >60)
GFR calc non Af Amer: >60 mL/min (ref >60)
GLUCOSE: 99 mg/dL (ref 65–99)
Potassium: 4.2 mmol/L (ref 3.5–5.1)
Sodium: 136 mmol/L (ref 135–145)

## 2016-07-28 LAB — HEPATIC FUNCTION PANEL
ALBUMIN: 4.4 g/dL (ref 3.5–5.0)
ALK PHOS: 59 U/L (ref 38–126)
ALT: 19 U/L (ref 14–54)
AST: 22 U/L (ref 15–41)
BILIRUBIN TOTAL: 0.5 mg/dL (ref 0.3–1.2)
Bilirubin, Direct: <0.1 mg/dL — ABNORMAL LOW (ref 0.1–0.5)
Total Protein: 7.3 g/dL (ref 6.5–8.1)

## 2016-07-28 LAB — I-STAT TROPONIN, ED: Troponin i, poc: 0 ng/mL (ref 0.00–0.08)

## 2016-07-28 MED ORDER — SODIUM CHLORIDE 0.9 % IV BOLUS (SEPSIS)
1000.0000 mL | Freq: Once | INTRAVENOUS | Status: AC
  Administered 2016-07-28: 1000 mL via INTRAVENOUS

## 2016-07-28 MED ORDER — MECLIZINE HCL 25 MG PO TABS: 25.0000 mg | ORAL_TABLET | Freq: Two times a day (BID) | ORAL | 0 refills | Status: DC | PRN

## 2016-07-28 MED ORDER — MECLIZINE HCL 25 MG PO TABS
25.0000 mg | ORAL_TABLET | Freq: Once | ORAL | Status: AC
  Administered 2016-07-28: 25 mg via ORAL
  Filled 2016-07-28: qty 1

## 2016-07-28 NOTE — ED Notes (Signed)
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

## 2016-07-28 NOTE — Discharge Instructions (Signed)
Follow-up with her family doctor for your cardiologist in 1-2 weeks for recheck

## 2016-07-28 NOTE — ED Provider Notes (Signed)
[facility]ENCY DEPT Provider Note   CSN: [id number] Arrival date & time: 07/28/16  2841     History   Chief Complaint Chief Complaint  Patient presents with  . Chest Pain    HPI Kathryn Brown is a 41 y.o. female.  Patient states that she felt dizzy last night room was spinning some. And eventually she passed out on the bed. Today she complains of some unsteadiness.       The history is provided by the patient.  Loss of Consciousness   This is a new problem. The current episode started 12 to 24 hours ago. The problem occurs rarely. The problem has been resolved. She lost consciousness for a period of 1 to 5 minutes. The problem is associated with normal activity. Associated symptoms include dizziness and visual change. Pertinent negatives include abdominal pain, back pain, chest pain, congestion, headaches and seizures. She has tried nothing for the symptoms. The treatment provided significant relief. Her past medical history does not include CVA.    Past Medical History:  Diagnosis Date  . Chest pain   . Heart murmur    as a child  . Heart palpitations   . History of stomach ulcers    as a child    Patient Active Problem List   Diagnosis Date Noted  . MVP (mitral valve prolapse) 10/30/2011    Past Surgical History:  Procedure Laterality Date  . TONSILECTOMY, ADENOIDECTOMY, BILATERAL MYRINGOTOMY AND TUBES      OB History    No data available       Home Medications    Prior to Admission medications   Medication Sig Start Date End Date Taking? Authorizing Provider  acetaminophen (TYLENOL) 500 MG tablet Take 1,000 mg by mouth every 6 (six) hours as needed for headache.   Yes [provider]  aspirin-acetaminophen-caffeine (EXCEDRIN MIGRAINE) 5803931551 MG tablet Take 2 tablets by mouth every 6 (six) hours as needed for headache.   Yes [provider]  ibuprofen (ADVIL,MOTRIN) 200 MG tablet Take 800 mg by mouth every 6 (six) hours as needed for  headache.   Yes [provider]  naproxen sodium (ANAPROX) 220 MG tablet Take 440 mg by mouth as needed.   Yes [provider]  propranolol (INDERAL) 10 MG tablet Take 1 tablet (10 mg total) by mouth 4 (four) times daily. 10/29/11  Yes Tonye Pearson, MD  albuterol (PROVENTIL HFA;VENTOLIN HFA) 108 (90 Base) MCG/ACT inhaler Inhale 2 puffs into the lungs every 4 (four) hours as needed for wheezing or shortness of breath. Patient not taking: Reported on 07/28/2016 01/08/16   Hayden Rasmussen, NP  meclizine (ANTIVERT) 25 MG tablet Take 1 tablet (25 mg total) by mouth 2 (two) times daily as needed for dizziness. 07/28/16   Bethann Berkshire, MD    Family History Family History  Problem Relation Age of Onset  . Heart disease Mother   . Hypertension Mother   . Diabetes Mother   . Heart disease Father   . Hypertension Father     Social History Social History  Substance Use Topics  . Smoking status: Current Some Day Smoker    Packs/day: 0.50    Types: Cigarettes  . Smokeless tobacco: Never Used     Comment: occasionally smokes a cigarette  . Alcohol use Yes     Allergies   Sulfa antibiotics   Review of Systems Review of Systems  Constitutional: Negative for appetite change and fatigue.  HENT: Negative for congestion, ear  discharge and sinus pressure.   Eyes: Negative for discharge.  Respiratory: Negative for cough.   Cardiovascular: Positive for syncope. Negative for chest pain.  Gastrointestinal: Negative for abdominal pain and diarrhea.  Genitourinary: Negative for frequency and hematuria.  Musculoskeletal: Negative for back pain.  Skin: Negative for rash.  Neurological: Positive for dizziness. Negative for seizures and headaches.  Psychiatric/Behavioral: Negative for hallucinations.     Physical Exam Updated Vital Signs BP 111/90   Pulse 70   Temp 99 F (37.2 C)   Resp 17   LMP 07/07/2016 (Within Days)   SpO2 100%   Physical Exam  Constitutional: She  is oriented to person, place, and time. She appears well-developed.  HENT:  Head: Normocephalic.  Eyes: Conjunctivae and EOM are normal. No scleral icterus.  Neck: Neck supple. No thyromegaly present.  Cardiovascular: Normal rate and regular rhythm.  Exam reveals no gallop and no friction rub.   No murmur heard. Pulmonary/Chest: No stridor. She has no wheezes. She has no rales. She exhibits no tenderness.  Abdominal: She exhibits no distension. There is no tenderness. There is no rebound.  Musculoskeletal: Normal range of motion. She exhibits no edema.  Lymphadenopathy:    She has no cervical adenopathy.  Neurological: She is oriented to person, place, and time. She exhibits normal muscle tone. Coordination normal.  Skin: No rash noted. No erythema.  Psychiatric: She has a normal mood and affect. Her behavior is normal.     ED Treatments / Results  Labs (all labs ordered are listed, but only abnormal results are displayed) Labs Reviewed  CBC - Abnormal; Notable for the following:       Result Value   WBC 10.7 (*)    All other components within normal limits  HEPATIC FUNCTION PANEL - Abnormal; Notable for the following:    Bilirubin, Direct <0.1 (*)    All other components within normal limits  I-STAT BETA HCG BLOOD, ED ([facility], [facility], [facility] ONLY) - Abnormal; Notable for the following:    I-stat hCG, quantitative 5.4 (*)    All other components within normal limits  BASIC METABOLIC PANEL  I-STAT TROPOININ, ED    EKG  EKG Interpretation  Date/Time:  Thursday Jul 28 2016 08:54:15 EDT Ventricular Rate:  79 PR Interval:  160 QRS Duration: 84 QT Interval:  370 QTC Calculation: 424 R Axis:   89 Text Interpretation:  Normal sinus rhythm Normal ECG Confirmed by Deryk Bozman  MD, Felicita Nuncio ([id number]) on 07/28/2016 10:07:20 AM       Radiology Dg Chest 2 View  Result Date: 07/28/2016 CLINICAL DATA:  Chest pain and syncope. EXAM: CHEST  2 VIEW COMPARISON:  04/29/2014 FINDINGS: Normal heart size and  mediastinal contours. No acute infiltrate or edema. No effusion or pneumothorax. No acute osseous findings. Artifact from EKG pads. IMPRESSION: Negative chest. Electronically Signed   By: Marnee SpringJonathon  Watts M.D.   On: 07/28/2016 09:29   Mr Brain Wo Contrast  Result Date: 07/28/2016 CLINICAL DATA:  Initial evaluation for acute dizziness, near syncope, headaches. History of migraines. EXAM: MRI HEAD WITHOUT CONTRAST TECHNIQUE: Multiplanar, multiecho pulse sequences of the brain and surrounding structures were obtained without intravenous contrast. COMPARISON:  Prior CT from 04/29/2014. FINDINGS: Brain: Cerebral volume normal for age. No significant cerebral white matter disease for age. No abnormal foci of restricted diffusion to suggest acute or subacute ischemia. Gray-white matter differentiation maintained. No encephalomalacia to suggest chronic infarction. No evidence for acute or chronic intracranial hemorrhage. No mass lesion, midline shift  or mass effect. Ventricles normal size without evidence for hydrocephalus. No extra-axial fluid collection. Major dural sinuses are patent. Pituitary gland suprasellar region within normal limits. Midline structures intact and normal. Vascular: Major intracranial vascular flow voids are well maintained. Skull and upper cervical spine: Craniocervical junction within normal limits. Visualized upper cervical spine unremarkable. Bone marrow signal intensity within normal limits. No scalp soft tissue abnormality. Sinuses/Orbits: Globes and orbital soft tissues within normal limits. Few scattered retention cysts noted within the maxillary sinuses. Paranasal sinuses are otherwise clear. No mastoid effusion. Inner ear structures normal. Other: None. IMPRESSION: Normal brain MRI.  No acute intracranial process identified. Electronically Signed   By: Rise Mu M.D.   On: 07/28/2016 12:54    Procedures Procedures (including critical care time)  Medications Ordered in  ED Medications  sodium chloride 0.9 % bolus 1,000 mL (1,000 mLs Intravenous New Bag/Given 07/28/16 1055)  meclizine (ANTIVERT) tablet 25 mg (25 mg Oral Given 07/28/16 1059)     Initial Impression / Assessment and Plan / ED Course  I have reviewed the triage vital signs and the nursing notes.  Pertinent labs & imaging results that were available during my care of the patient were reviewed by me and considered in my medical decision making (see chart for details).     Lab including EKG chest x-ray MRI of the head troponin are all unremarkable. Patient's dizziness improved some with Antivert. Patient was instructed to make sure she eats something 3 times a day drink plenty of fluids take Antivert if she has a spinning feeling unsteady on her feet. She is to follow-up with her cardiologist or find a family doctor and adnexa week or 2 for recheck  Final Clinical Impressions(s) / ED Diagnoses   Final diagnoses:  Syncope, unspecified syncope type    New Prescriptions New Prescriptions   MECLIZINE (ANTIVERT) 25 MG TABLET    Take 1 tablet (25 mg total) by mouth 2 (two) times daily as needed for dizziness.     Bethann Berkshire, MD 07/28/16 1340

## 2016-07-28 NOTE — ED Notes (Signed)
Patient transported to MRI 

## 2016-07-28 NOTE — ED Triage Notes (Signed)
Pt states last night she had an episode of syncope where she became very hot, short of breath and had chest pain. Pt reports she had a similar episode this morning. Pt reports increase in stress lately. Alert and oriented in triage.

## 2016-08-02 ENCOUNTER — Ambulatory Visit: Payer: BLUE CROSS/BLUE SHIELD | Admitting: Cardiovascular Disease

## 2016-08-05 ENCOUNTER — Encounter: Payer: Self-pay | Admitting: Cardiovascular Disease

## 2017-05-25 ENCOUNTER — Ambulatory Visit: Payer: BLUE CROSS/BLUE SHIELD | Admitting: Physician Assistant

## 2017-05-25 ENCOUNTER — Encounter: Payer: Self-pay | Admitting: Physician Assistant

## 2017-05-25 ENCOUNTER — Other Ambulatory Visit: Payer: Self-pay

## 2017-05-25 ENCOUNTER — Ambulatory Visit: Payer: Self-pay | Admitting: *Deleted

## 2017-05-25 VITALS — BP 106/74 | HR 90 | Temp 98.2°F | Resp 18 | Ht 67.52 in | Wt 166.2 lb

## 2017-05-25 DIAGNOSIS — R0602 Shortness of breath: Secondary | ICD-10-CM

## 2017-05-25 DIAGNOSIS — F419 Anxiety disorder, unspecified: Secondary | ICD-10-CM

## 2017-05-25 DIAGNOSIS — R942 Abnormal results of pulmonary function studies: Secondary | ICD-10-CM

## 2017-05-25 DIAGNOSIS — F329 Major depressive disorder, single episode, unspecified: Secondary | ICD-10-CM

## 2017-05-25 DIAGNOSIS — F32A Depression, unspecified: Secondary | ICD-10-CM

## 2017-05-25 MED ORDER — HYDROXYZINE HCL 25 MG PO TABS
12.5000 mg | ORAL_TABLET | Freq: Three times a day (TID) | ORAL | 0 refills | Status: DC | PRN
Start: 2017-05-25 — End: 2017-05-25

## 2017-05-25 MED ORDER — ESCITALOPRAM OXALATE 10 MG PO TABS: 10.0000 mg | ORAL_TABLET | Freq: Every day | ORAL | 0 refills | Status: DC

## 2017-05-25 MED ORDER — HYDROXYZINE HCL 25 MG PO TABS: 12.5000 mg | ORAL_TABLET | Freq: Three times a day (TID) | ORAL | 0 refills | Status: DC | PRN

## 2017-05-25 NOTE — Progress Notes (Signed)
Kathryn Brown  MRN: [id number] DOB: 07-30-1975  Subjective:  Kathryn Brown is a 42 y.o. female seen in office today for a chief complaint of intermittent trouble taking a deep breath x 6 months. Not present today.  Usually first thing in the morning but can occur at anytime. Fresh air helps. Feels warmer when this happens. Denies associated SOB, wheezing, DOE, orthopnea, chest pain, and palpitations. Will also have intermittent dizziness. Thinks it is associated with anxiety. Has been seen in ED for this in the past. Last visit was 06/2016. Had normal EKG, CXR, and brain MRI. Has been also having hot flashes and irritability. Denies vaginal dryness, hair thinning, decreased sexual drive, night sweats, and irregular cycles. Menstrual cycles are regular, occur monthly. Lasts 4-5 days. Does not know when mother went through menopause. Of note, she has been very stressed and anxious lately. 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 Will often feel very overwhelmed and sensations of impending doom. Notes when she feels like this she will have difficulty breathing. Has been encouraged in the past to take medication for anxiety but has never tried anything. Denies euphoria, risky behaviors, decreased need for sleep, and grandiose delusions.  Notes her cardiologist tried to give her Rx of xanax in the past but she refused. Has never attended counseling.She has PMH of MVP. Was on propranolol in the past but not sure why.  Supposed to be followed by cardiology but has not followed up in years. Had asthma as a child but has not had issues since. Does not use inhalers. Denies PMH of COPD, seasonal allergies, HTN, HLD, or seizure disorder. Has FH of anxiety in [facility] and father. No hx of heart disease or thryoid anxiety. She is a former smoker, quit 3 months ago. Prior to this, smoked 0.5 ppd x 20 years.   Review of Systems  Constitutional: Negative for diaphoresis and fever.  Eyes:  Negative for visual disturbance.  Respiratory: Negative for cough and stridor.   Cardiovascular: Negative for leg swelling.  Gastrointestinal: Negative for abdominal distention, abdominal pain, constipation, diarrhea, nausea and vomiting.  Neurological: Negative for facial asymmetry, speech difficulty and weakness.  Psychiatric/Behavioral: Positive for decreased concentration and dysphoric mood. Negative for confusion, hallucinations, self-injury and suicidal ideas. The patient is nervous/anxious. The patient is not hyperactive.     Patient Active Problem List   Diagnosis Date Noted  . MVP (mitral valve prolapse) 10/30/2011    Current Outpatient Medications on File Prior to Visit  Medication Sig Dispense Refill  . acetaminophen (TYLENOL) 500 MG tablet Take 1,000 mg by mouth every 6 (six) hours as needed for headache.    Marland Kitchen aspirin-acetaminophen-caffeine (EXCEDRIN MIGRAINE) 250-250-65 MG tablet Take 2 tablets by mouth every 6 (six) hours as needed for headache.    . ibuprofen (ADVIL,MOTRIN) 200 MG tablet Take 800 mg by mouth every 6 (six) hours as needed for headache.    . naproxen sodium (ANAPROX) 220 MG tablet Take 440 mg by mouth as needed.    . propranolol (INDERAL) 10 MG tablet Take 1 tablet (10 mg total) by mouth 4 (four) times daily. 120 tablet 11  . albuterol (PROVENTIL HFA;VENTOLIN HFA) 108 (90 Base) MCG/ACT inhaler Inhale 2 puffs into the lungs every 4 (four) hours as needed for wheezing or shortness of breath. (Patient not taking: Reported on 07/28/2016) 1 Inhaler 0  . meclizine (ANTIVERT) 25 MG tablet Take 1 tablet (25 mg total) by mouth 2 (two) times  daily as needed for dizziness. (Patient not taking: Reported on 05/25/2017) 15 tablet 0   No current facility-administered medications on file prior to visit.     Allergies  Allergen Reactions  . Sulfa Antibiotics Anaphylaxis      Social History   Socioeconomic History  . Marital status: Single    Spouse name: Not on file  .  Number of children: Not on file  . Years of education: Not on file  . Highest education level: Not on file  Occupational History  . Not on file  Social Needs  . Financial resource strain: Not on file  . Food insecurity:    Worry: Not on file    Inability: Not on file  . Transportation needs:    Medical: Not on file    Non-medical: Not on file  Tobacco Use  . Smoking status: Former Smoker    Packs/day: 0.50    Years: 20.00    Pack years: 10.00    Types: Cigarettes    Last attempt to quit: 02/24/2017    Years since quitting: 0.2  . Smokeless tobacco: Never Used  Substance and Sexual Activity  . Alcohol use: Yes    Alcohol/week: 0.6 oz    Types: 1 Glasses of wine per week  . Drug use: No  . Sexual activity: Not on file  Lifestyle  . Physical activity:    Days per week: Not on file    Minutes per session: Not on file  . Stress: Not on file  Relationships  . Social connections:    Talks on phone: Not on file    Gets together: Not on file    Attends religious service: Not on file    Active member of club or organization: Not on file    Attends meetings of clubs or organizations: Not on file    Relationship status: Not on file  . Intimate partner violence:    Fear of current or ex partner: Not on file    Emotionally abused: Not on file    Physically abused: Not on file    Forced sexual activity: Not on file  Other Topics Concern  . Not on file  Social History Narrative  . Not on file    Objective:  BP 106/74 (BP Location: Left Arm, Patient Position: Sitting, Cuff Size: Normal)   Pulse 90   Temp 98.2 F (36.8 C) (Oral)   Resp 18   Ht 5' 7.52" (1.715 m)   Wt 166 lb 3.2 oz (75.4 kg)   LMP 05/19/2017 (Approximate)   SpO2 95%   BMI 25.63 kg/m   Physical Exam  Constitutional: She is oriented to person, place, and time and well-developed, well-nourished, and in no distress.  HENT:  Head: Normocephalic and atraumatic.  Eyes: Conjunctivae are normal.  Neck:  Normal range of motion. No thyroid mass and no thyromegaly present.  Cardiovascular: Normal rate, regular rhythm, normal heart sounds and normal pulses.  Pulmonary/Chest: Effort normal and breath sounds normal. No accessory muscle usage. No respiratory distress. She has no decreased breath sounds. She has no wheezes. She has no rhonchi. She has no rales.  Neurological: She is alert and oriented to person, place, and time. Gait normal.  Skin: Skin is warm and dry.  Psychiatric: Affect normal. Her mood appears anxious.  Rapid speech noted.   Vitals reviewed.    Wt Readings from Last 3 Encounters:  05/25/17 166 lb 3.2 oz (75.4 kg)  10/29/11 147 lb (66.7 kg)  GAD 7 : Generalized Anxiety Score 05/25/2017  Nervous, Anxious, on Edge 3  Control/stop worrying 3  Worry too much - different things 3  Trouble relaxing 3  Restless 3  Easily annoyed or irritable 3  Afraid - awful might happen 3  Total GAD 7 Score 21  Anxiety Difficulty Extremely difficult   Depression screen PHQ 2/9 05/25/2017  Decreased Interest 0  Down, Depressed, Hopeless 2  PHQ - 2 Score 2  Altered sleeping 3  Tired, decreased energy 3  Change in appetite 3  Feeling bad or failure about yourself  3  Trouble concentrating 3  Moving slowly or fidgety/restless 3  Suicidal thoughts 3  PHQ-9 Score 23  Difficult doing work/chores Somewhat difficult     No results found for this or any previous visit (from the past 24 hour(s)).  Pulse ox while ambulating is 99%.   EKG shows NSR with rate of 70 bpm. PR and QRS intervals within normal limits. No acute ST or T wave abnormalities. No acute changes noted from prior EKG of 06/2016. Findings presented and discussed with Dr. Pamella Pert.   I personally reviewed ED notes from 07/28/2016 visit.  Assessment and Plan :  This case was precepted with Dr. Pamella Pert.   1. SOB (shortness of breath) Lungs CTAB. Pulse ox while ambulating is 99%.EKG normal. Labs pending. Unfortunately,  xray machine is down today.  However, this is a chronic intermittent issue, which is not present today and we can therefore obtain CXR at follow up appointment in 2 weeks. Could be due to untreated anxiety as pt does appear very anxious in office today. She is a former smoker and PFTs were abnormal in office today. Will treat anxiety/depression and place referral to pulm for further evaluation of abnormal PFTs. Recommended follow up with cardiology.  - DG Chest 2 View; Future - EKG 12-Lead - CBC with Differential/Platelet - CMP14+EGFR - TSH - Vitamin B12 - Care order/instruction - Ambulatory referral to Pulmonology - Check Pulse Oximetry while ambulating  2. Abnormal PFTs (pulmonary function tests) - Ambulatory referral to Pulmonology  3. Anxiety and depression Appears very uncontrolled at this time. Anxiety seems to be the major issue with associated depression. GAD and PHQ completed. Scores place patient in severe range.  Denies current suicidal/homicidal thoughts. Recommend tx with SSRI and hydroxyzine for panic attacks as pt denies use of any medication with potential addictive properties such as benzos. Also recommend counseling. Educated on potential side effects of medication. Given educational material for local therapists. Follow up in 2 weeks for reevaluation. If no improvement, consider referral to psychiatry for further evaluation and management. - escitalopram (LEXAPRO) 10 MG tablet; Take 1 tablet (10 mg total) by mouth daily.  Dispense: 30 tablet; Refill: 0 - hydrOXYzine (ATARAX/VISTARIL) 25 MG tablet; Take 0.5-1 tablets (12.5-25 mg total) by mouth every 8 (eight) hours as needed for anxiety.  Dispense: 30 tablet; Refill: 0  A total of 45 minutes was spent in the room with the patient, greater than 50% of which was in counseling/coordination of care regarding intermittent SOB and newly dx anxiety and depression.  Tenna Delaine PA-C  [facility] 05/25/2017 6:56 PM

## 2017-05-25 NOTE — Telephone Encounter (Signed)
Pt called with complaints of "breathing problems";  She says that she thinks that she is going through the first stages of menopause and is having hot flashes and feels woozy, it is hard to focus, and having a hard time catching her breath; she is also anxious "like she had too much coffee"; recommendations made per nurse triage protocol; pt already scheduled appointment per agent, Montey HoraAndra, for today at 1740 with [facility]; will route to office for notification of this upcoming appointment.  Reason for Disposition . [1] MILD longstanding difficulty breathing AND [2]  SAME as normal  Answer Assessment - Initial Assessment Questions 1. RESPIRATORY STATUS: "Describe your breathing?" (e.g., wheezing, shortness of breath, unable to speak, severe coughing)      occasional shortness of breath 2. ONSET: "When did this breathing problem begin?"      A couple of months ago 3. PATTERN "Does the difficult breathing come and go, or has it been constant since it started?"     Comes and goes 4. SEVERITY: "How bad is your breathing?" (e.g., mild, moderate, severe)    - MILD: No SOB at rest, mild SOB with walking, speaks normally in sentences, can lay down, no retractions, pulse < 100.    - MODERATE: SOB at rest, SOB with minimal exertion and prefers to sit, cannot lie down flat, speaks in phrases, mild retractions, audible wheezing, pulse 100-120.    - SEVERE: Very SOB at rest, speaks in single words, struggling to breathe, sitting hunched forward, retractions, pulse > 120      mild 5. RECURRENT SYMPTOM: "Have you had difficulty breathing before?" If so, ask: "When was the last time?" and "What happened that time?"      This am 6. CARDIAC HISTORY: "Do you have any history of heart disease?" (e.g., heart attack, angina, bypass surgery, angioplasty)      Mitral valve regurgitation 7. LUNG HISTORY: "Do you have any history of lung disease?"  (e.g., pulmonary embolus, asthma, emphysema)    Asthma as  a child 8. CAUSE: "What do you think is causing the breathing problem?"      ? menopause 9. OTHER SYMPTOMS: "Do you have any other symptoms? (e.g., dizziness, runny nose, cough, chest pain, fever)     occasional chest tightness and dizzness 10. PREGNANCY: "Is there any chance you are pregnant?" "When was your last menstrual period?"       LMP 05/19/17 11. TRAVEL: "Have you traveled out of the country in the last month?" (e.g., travel history, exposures)       no  Protocols used: BREATHING DIFFICULTY-A-AH

## 2017-05-25 NOTE — Patient Instructions (Addendum)
For depression and anxiety, I recommend we start a daily SSRI and weekly/monthly therapy. I also recommend a short term medication for moments of panic attacks. The SSRI I am prescribing is called lexapro. Please keep in mind that when you start an SSRI it can worsen your depression and anxiety symptoms. It can also increase risk of suicidal thoughts so if this occurs, please seek care immediately. Common side effects of SSRIs include, but are not limited to, GI upset, nausea, vomiting, insomnia, and drowsiness. Typically these side effects will resolve after 2 weeks. Please keep in mind that it can take up to 4-6 weeks for SSRIs to be fully effective. In terms of relief of panic attacks, please use hydroxyzine as needed.  This medication can make you drowsy so please keep this in mind. Below is some information for local therapists. I recommend you reach out to one of these therapists before our next visit.Plan to return to clinic in 2 weeks for reevaluation of symptoms.   For therapy -- [facility] ([address], Ernest McCoy, Heather Joycelyn Sch[place]chens, [place]la [place]) - [phone] [facility]([place]) - [phone] [facility] - [phone] [facility] - [phone] [place] - [phone] [facility]  - [phone] (do not file insurance)   You will need to follow-up with your cardiologist for refill of propranolol.  I have placed a referral to pulmonology for further evaluation of your lungs.   Thank you for letting me participate in your health and well being.

## 2017-05-26 ENCOUNTER — Encounter: Payer: Self-pay | Admitting: Physician Assistant

## 2017-05-26 LAB — CBC WITH DIFFERENTIAL/PLATELET
BASOS: 1 %
Basophils Absolute: 0 10*3/uL (ref 0.0–0.2)
EOS (ABSOLUTE): 0.2 10*3/uL (ref 0.0–0.4)
EOS: 2 %
HEMATOCRIT: 38 % (ref 34.0–46.6)
HEMOGLOBIN: 13 g/dL (ref 11.1–15.9)
IMMATURE GRANS (ABS): 0 10*3/uL (ref 0.0–0.1)
IMMATURE GRANULOCYTES: 0 %
Lymphocytes Absolute: 2.5 10*3/uL (ref 0.7–3.1)
Lymphs: 31 %
MCH: 30 pg (ref 26.6–33.0)
MCHC: 34.2 g/dL (ref 31.5–35.7)
MCV: 88 fL (ref 79–97)
MONOCYTES: 5 %
MONOS ABS: 0.4 10*3/uL (ref 0.1–0.9)
NEUTROS PCT: 61 %
Neutrophils Absolute: 5 10*3/uL (ref 1.4–7.0)
Platelets: 295 10*3/uL (ref 150–379)
RBC: 4.33 x10E6/uL (ref 3.77–5.28)
RDW: 13.8 % (ref 12.3–15.4)
WBC: 8.1 10*3/uL (ref 3.4–10.8)

## 2017-05-26 LAB — CMP14+EGFR
A/G RATIO: 1.8 (ref 1.2–2.2)
ALBUMIN: 4.7 g/dL (ref 3.5–5.5)
ALT: 27 IU/L (ref 0–32)
AST: 15 IU/L (ref 0–40)
Alkaline Phosphatase: 76 IU/L (ref 39–117)
BUN/Creatinine Ratio: 16 (ref 9–23)
BUN: 14 mg/dL (ref 6–24)
Bilirubin Total: 0.3 mg/dL (ref 0.0–1.2)
CALCIUM: 9.4 mg/dL (ref 8.7–10.2)
CO2: 21 mmol/L (ref 20–29)
CREATININE: 0.88 mg/dL (ref 0.57–1.00)
Chloride: 104 mmol/L (ref 96–106)
GFR, EST AFRICAN AMERICAN: 93 mL/min/{1.73_m2} (ref >59)
GFR, EST NON AFRICAN AMERICAN: 81 mL/min/{1.73_m2} (ref >59)
GLOBULIN, TOTAL: 2.6 g/dL (ref 1.5–4.5)
Glucose: 82 mg/dL (ref 65–99)
POTASSIUM: 4 mmol/L (ref 3.5–5.2)
SODIUM: 141 mmol/L (ref 134–144)
TOTAL PROTEIN: 7.3 g/dL (ref 6.0–8.5)

## 2017-05-26 LAB — VITAMIN B12: VITAMIN B 12: 348 pg/mL (ref 232–1245)

## 2017-05-26 LAB — TSH: TSH: 1.13 u[IU]/mL (ref 0.450–4.500)

## 2017-05-29 ENCOUNTER — Encounter: Payer: Self-pay | Admitting: *Deleted

## 2017-06-08 ENCOUNTER — Ambulatory Visit: Payer: BLUE CROSS/BLUE SHIELD | Admitting: Physician Assistant

## 2017-06-21 ENCOUNTER — Other Ambulatory Visit: Payer: Self-pay | Admitting: Physician Assistant

## 2017-06-21 DIAGNOSIS — F32A Depression, unspecified: Secondary | ICD-10-CM

## 2017-06-21 DIAGNOSIS — F329 Major depressive disorder, single episode, unspecified: Secondary | ICD-10-CM

## 2017-06-21 DIAGNOSIS — F419 Anxiety disorder, unspecified: Principal | ICD-10-CM

## 2017-06-22 NOTE — Telephone Encounter (Signed)
Please call patient.  I have given her 2 weeks worth of medication refills but she will need to return to office for further evaluation to receive additional refills as she was supposed to follow-up last week for reevaluation and did not. Thanks!

## 2017-06-23 ENCOUNTER — Other Ambulatory Visit: Payer: Self-pay

## 2017-06-23 DIAGNOSIS — F329 Major depressive disorder, single episode, unspecified: Secondary | ICD-10-CM

## 2017-06-23 DIAGNOSIS — F32A Depression, unspecified: Secondary | ICD-10-CM

## 2017-06-23 DIAGNOSIS — F419 Anxiety disorder, unspecified: Principal | ICD-10-CM

## 2017-06-23 NOTE — Telephone Encounter (Signed)
Patient is requesting a refill of the following medications: Requested Prescriptions   Pending Prescriptions Disp Refills  . escitalopram (LEXAPRO) 10 MG tablet 15 tablet 0    Sig: Take 1 tablet (10 mg total) by mouth daily.    Date of patient request: 06/21/17 Last office visit: 05/25/17 Date of last refill: 06/22/17 Last refill amount: 15 tab Follow up time period per chart: none

## 2017-07-27 ENCOUNTER — Other Ambulatory Visit: Payer: Self-pay | Admitting: Physician Assistant

## 2017-07-27 DIAGNOSIS — F329 Major depressive disorder, single episode, unspecified: Secondary | ICD-10-CM

## 2017-07-27 DIAGNOSIS — F32A Depression, unspecified: Secondary | ICD-10-CM

## 2017-07-27 DIAGNOSIS — F419 Anxiety disorder, unspecified: Principal | ICD-10-CM

## 2017-07-27 NOTE — Telephone Encounter (Signed)
Copied from CRM [id number]. Topic: Quick Communication - Rx Refill/Question >> Jul 27, 2017  2:06 PM Louie Bun, Rosey Bath D wrote: Medication:escitalopram (LEXAPRO) 10 MG tablet  Has the patient contacted their pharmacy?Yes pt would like enough to last her until her next appt. (Agent: If no, request that the patient contact the pharmacy for the refill.) (Agent: If yes, when and what did the pharmacy advise?)  Preferred Pharmacy (with phone number or street name): [id number] - [place], [place] - [address]  Agent: Please be advised that RX refills may take up to 3 business days. We ask that you follow-up with your pharmacy.

## 2017-07-28 NOTE — Telephone Encounter (Signed)
Patient is requesting enough medication to get her to her appointment - 08/05/17. She has had her #15 already. For provider review.  LOV: 05/25/17  PCP: Ernesto Rutherford  [facility] [address]

## 2017-08-04 ENCOUNTER — Ambulatory Visit: Payer: BLUE CROSS/BLUE SHIELD | Admitting: Internal Medicine

## 2017-08-04 ENCOUNTER — Encounter: Payer: Self-pay | Admitting: Internal Medicine

## 2017-08-04 ENCOUNTER — Ambulatory Visit (INDEPENDENT_AMBULATORY_CARE_PROVIDER_SITE_OTHER)
Admission: RE | Admit: 2017-08-04 | Discharge: 2017-08-04 | Disposition: A | Discharge status: Home / Self Care | Payer: BLUE CROSS/BLUE SHIELD | Source: Ambulatory Visit | Attending: Internal Medicine | Admitting: Internal Medicine

## 2017-08-04 VITALS — BP 122/84 | HR 95 | Ht 69.0 in | Wt 168.0 lb

## 2017-08-04 DIAGNOSIS — R0600 Dyspnea, unspecified: Secondary | ICD-10-CM

## 2017-08-04 DIAGNOSIS — R058 Other specified cough: Secondary | ICD-10-CM

## 2017-08-04 DIAGNOSIS — R0602 Shortness of breath: Secondary | ICD-10-CM | POA: Diagnosis not present

## 2017-08-04 DIAGNOSIS — R0609 Other forms of dyspnea: Secondary | ICD-10-CM

## 2017-08-04 DIAGNOSIS — R05 Cough: Secondary | ICD-10-CM

## 2017-08-04 MED ORDER — FAMOTIDINE 20 MG PO TABS: ORAL_TABLET | ORAL | 11 refills | Status: DC

## 2017-08-04 MED ORDER — PANTOPRAZOLE SODIUM 40 MG PO TBEC: 40.0000 mg | DELAYED_RELEASE_TABLET | Freq: Every day | ORAL | 2 refills | Status: DC

## 2017-08-04 NOTE — Progress Notes (Signed)
Subjective:     Patient ID: Kathryn Brown, female   DOB: 12/29/1975,    MRN: [id number]  HPI  [age] yowf  light smoker  Mostly  [place] December 2018 [place] "cough as long as she can remember" but  played High [employer] good ex tol no resp problems including IUP 2002 and resumed wt =  baseline 120 then gradual wt gain assoc with doe much worse since early 2019 so referred to pulmonary clinic 08/04/2017 by Dr   Benjiman CoreBrittany Wiseman    08/04/2017 1st [facility] office visit/ Searra Carnathan   Chief Complaint  Patient presents with  . Pulmonary Consult    Referred by Benjiman CoreBrittany Wiseman, PA. Pt c/o SOB x 6 months. She states she gets winded walking up stairs.  She occ feels SOB when she first wakes up in the morning.    new onset sob assoc with wt gain happens at rest and exercise "about the same" no aerobics but very active walking floors at work  Resting sob comes and goes within a breath or two "just feels like can't take a deep one" / always happens up 2 flight of steps s assoc wheeze or cp and no better p saba / rest sob may be worse when feels hot/ def worse when anxious and carries dx of MVP "always had a cough" just like motherDay > noct/ mostly  non-productive assoc with intermittent sore throat    No obvious day to day or daytime variability or assoc excess/ purulent sputum or mucus plugs or hemoptysis or cp or chest tightness, subjective wheeze or overt sinus or hb symptoms. No unusual exposure hx or h/o childhood pna/ asthma or knowledge of premature birth.  Sleeping  Ok most nocts  without nocturnal  or early am exacerbation  of respiratory  c/o's or need for noct saba. Also denies any obvious fluctuation of symptoms with weather or environmental changes or other aggravating or alleviating factors except as outlined above   Current Allergies, Complete Past Medical History, Past Surgical History, Family History, and Social History were reviewed in [facility] electronic  medical record.  ROS  The following are not active complaints unless bolded Hoarseness, sore throat, dysphagia, dental problems, itching, sneezing,  nasal congestion or discharge of excess mucus or purulent secretions, ear ache,   fever, chills, sweats, unintended wt loss or wt gain, classically pleuritic or exertional cp,  orthopnea pnd or arm/hand swelling  or leg swelling, presyncope, palpitations, abdominal pain, anorexia, nausea, vomiting, diarrhea  or change in bowel habits or change in bladder habits, change in stools or change in urine, dysuria, hematuria,  rash, arthralgias, visual complaints, headache, numbness, weakness or ataxia or problems with walking or coordination,  change in mood or  memory.           Current Meds  Medication Sig  . escitalopram (LEXAPRO) 10 MG tablet Take 5 mg by mouth at bedtime.  .         Review of Systems     Objective:   Physical Exam    amb pleasant slt hoarst talkative wf nad having sensation at rest can't take a deep breath   Wt Readings from Last 3 Encounters:  08/04/17 168 lb (76.2 kg)  05/25/17 166 lb 3.2 oz (75.4 kg)  10/29/11 147 lb (66.7 kg)     Vital signs reviewed - Note on arrival 02 sats  98% on RA      HEENT: nl dentition, turbinates bilaterally,  and oropharynx. Nl external ear canals without cough reflex   NECK :  without JVD/Nodes/TM/ nl carotid upstrokes bilaterally   LUNGS: no acc muscle use,  Nl contour chest which is clear to A and P bilaterally without cough on insp or exp maneuvers   CV:  RRR  no s3 or murmur or increase in P2, and no edema   ABD:  soft and nontender with nl inspiratory excursion in the supine position. No bruits or organomegaly appreciated, bowel sounds nl  MS:  Nl gait/ ext warm without deformities, calf tenderness, cyanosis or clubbing No obvious joint restrictions   SKIN: warm and dry without lesions    NEURO:  alert, approp, nl sensorium with  no motor or cerebellar deficits  apparent.     CXR PA and Lateral:   08/04/2017 :    I personally reviewed images and agree with radiology impression as follows:    Normal chest  Labs  reviewed:      Chemistry      Component Value Date/Time   NA 141 05/25/2017 1851   K 4.0 05/25/2017 1851   CL 104 05/25/2017 1851   CO2 21 05/25/2017 1851   BUN 14 05/25/2017 1851   CREATININE 0.88 05/25/2017 1851   CREATININE 0.81 10/29/2011 1046      Component Value Date/Time   CALCIUM 9.4 05/25/2017 1851   ALKPHOS 76 05/25/2017 1851   AST 15 05/25/2017 1851   ALT 27 05/25/2017 1851   BILITOT 0.3 05/25/2017 1851        Lab Results  Component Value Date   WBC 8.1 05/25/2017   HGB 13.0 05/25/2017   HCT 38.0 05/25/2017   MCV 88 05/25/2017   PLT 295 05/25/2017         Lab Results  Component Value Date   TSH 1.130 05/25/2017           Assessment:

## 2017-08-04 NOTE — Patient Instructions (Addendum)
Pantoprazole (protonix) 40 mg   Take  30-60 min before first meal of the day and Pepcid (famotidine)  20 mg one @  bedtime until return to office - this is the best way to tell whether stomach acid is contributing to your problem.    For drainage / throat tickle try take CHLORPHENIRAMINE  4 mg - take one every 4 hours as needed - available over the counter- may cause drowsiness so start with just a bedtime dose or two and see how you tolerate it before trying in daytime    GERD (REFLUX)  is an extremely common cause of respiratory symptoms just like yours , many times with no obvious heartburn at all.    It can be treated with medication, but also with lifestyle changes including elevation of the head of your bed (ideally with 6 inch  bed blocks),  Smoking cessation, avoidance of late meals, excessive alcohol, and avoid fatty foods, chocolate, peppermint, colas, red wine, and acidic juices such as orange juice.  NO MINT OR MENTHOL PRODUCTS SO NO COUGH DROPS   USE SUGARLESS CANDY INSTEAD (Jolley ranchers or Stover's or Life Savers) or even ice chips will also do - the key is to swallow to prevent all throat clearing. NO OIL BASED VITAMINS - use powdered substitutes.      To get the most out of exercise, you need to be continuously aware that you are short of breath, but never out of breath, for 30 minutes daily. As you improve, it will actually be easier for you to do the same amount of exercise  in  30 minutes so always push to the level where you are short of breath.  If this does not result in gradual improvement in exercise tolerance after a month  please call to schedule CPST   Please remember to go to the  x-ray department downstairs in the basement  for your tests - we will call you with the results when they are available.       If better  Then just return in 6 weeks

## 2017-08-04 NOTE — Progress Notes (Signed)
Spoke with pt and notified of results per Dr. Wert. Pt verbalized understanding and denied any questions. 

## 2017-08-05 ENCOUNTER — Ambulatory Visit: Payer: BLUE CROSS/BLUE SHIELD | Admitting: Physician Assistant

## 2017-08-06 ENCOUNTER — Encounter: Payer: Self-pay | Admitting: Internal Medicine

## 2017-08-06 DIAGNOSIS — R058 Other specified cough: Secondary | ICD-10-CM | POA: Insufficient documentation

## 2017-08-06 DIAGNOSIS — R05 Cough: Secondary | ICD-10-CM | POA: Insufficient documentation

## 2017-08-06 NOTE — Assessment & Plan Note (Addendum)
08/04/2017  Walked RA x 3 laps @ 185 ft each stopped due to  End of study, fast pace, no sob or desat    - Spirometry 08/04/2017  wnl s curvature   Symptoms are markedly disproportionate to objective findings and not clear to what extent this is actually a pulmonary  problem but pt does appear to have difficult to sort out respiratory symptoms of unknown origin for which  DDX  = almost all start with A and  include Adherence, Ace Inhibitors, Acid Reflux, Active Sinus Disease, Alpha 1 Antitripsin deficiency, Anxiety masquerading as Airways dz,  ABPA,  Allergy(esp in young), Aspiration (esp in elderly), Adverse effects of meds,  Active smokers, A bunch of PE's/clot burden (a few small clots can't cause this syndrome unless there is already severe underlying pulm or vascular dz with poor reserve),  Anemia or thyroid disorder, plus two Bs  = Bronchiectasis and Beta blocker use..and one C= CHF     ? Acid (or non-acid) GERD > always difficult to exclude as up to 75% of pts in some series report no assoc GI/ Heartburn symptoms > rec max (24h)  acid suppression and diet restrictions/ reviewed and instructions given in writing.   ? Anxiety/depresion assoc with wt gain/ Panic disorder  > usually at the bottom of this list of usual suspects but should be much higher on this pt's based on H and P and note already on psychotropics and may interfere with adherence and also interpretation of response or lack thereof to symptom management which can be quite subjective.   ? Asthma/ allergy  Unlikely as pt having symptoms at time of spirometry which was wnl and does not have assoc rhinitis/noct symptoms  ? Adverse drug effects > none of the usual suspect listed  ? Anemia/ thyroid dz > ruled out by PCP  ? Cardiac/ MVP prior dx noted : may need echo repeated as last study was 2004    If not improving with rx for gerd/ reconditioning ex (see avs for instructions unique to this ov) rec cpst next    Total time devoted to  counseling  > 50 % of initial 60 min office visit:  review case with pt/ discussion of options/alternatives/ personally creating written customized instructions  in presence of pt  then going over those specific  Instructions directly with the pt including how to use all of the meds but in particular covering each new medication in detail and the difference between the maintenance= "automatic" meds and the prns using an action plan format for the latter (If this problem/symptom => do that organization reading Left to right).  Please see AVS from this visit for a full list of these instructions which I personally wrote for this pt and  are unique to this visit.

## 2017-09-15 ENCOUNTER — Ambulatory Visit: Payer: BLUE CROSS/BLUE SHIELD | Admitting: Internal Medicine

## 2018-03-24 ENCOUNTER — Ambulatory Visit
Admission: EM | Admit: 2018-03-24 | Discharge: 2018-03-24 | Disposition: A | Discharge status: Home / Self Care | Payer: BLUE CROSS/BLUE SHIELD | Attending: Family Medicine | Admitting: Family Medicine

## 2018-03-24 ENCOUNTER — Encounter: Payer: Self-pay | Admitting: Family Medicine

## 2018-03-24 ENCOUNTER — Other Ambulatory Visit: Payer: Self-pay

## 2018-03-24 DIAGNOSIS — B349 Viral infection, unspecified: Secondary | ICD-10-CM | POA: Diagnosis not present

## 2018-03-24 LAB — POCT INFLUENZA A/B
INFLUENZA A, POC: NEGATIVE
Influenza B, POC: NEGATIVE

## 2018-03-24 MED ORDER — ESCITALOPRAM OXALATE 10 MG PO TABS: 5.0000 mg | ORAL_TABLET | Freq: Every day | ORAL | 0 refills | Status: DC

## 2018-03-24 MED ORDER — HYDROXYZINE HCL 25 MG PO TABS: 25.0000 mg | ORAL_TABLET | Freq: Four times a day (QID) | ORAL | 0 refills | Status: DC

## 2018-03-24 NOTE — ED Triage Notes (Signed)
Per pt starting Wednesday she started having congestion and cough with sore throat. Pt stated having right ear pressure. Pt has no fevers at this time. No chills.

## 2018-03-24 NOTE — Discharge Instructions (Addendum)
Your flu test was negative This is most likely a viral upper respiratory infection.  You can continue using the over-the-counter medicine or the natural remedies for your symptoms I am refilling your anxiety medication Follow up as needed for continued or worsening symptoms

## 2018-03-25 NOTE — ED Provider Notes (Signed)
[facility]    CSN: [id number] Arrival date & time: 03/24/18  1128     History   Chief Complaint Chief Complaint  Patient presents with  . Nasal Congestion    HPI Kathryn Brown is a 43 y.o. female.    URI  Presenting symptoms: congestion, cough, rhinorrhea and sore throat   Severity:  Mild Duration:  4 days Timing:  Constant Progression:  Waxing and waning Chronicity:  New Relieved by:  Nothing Worsened by:  Nothing Ineffective treatments: natural remedies  Associated symptoms: myalgias   Risk factors: sick contacts     Past Medical History:  Diagnosis Date  . Chest pain   . Heart murmur    as a child  . Heart palpitations   . History of stomach ulcers    as a child  . MVP (mitral valve prolapse) 10/30/2011    Patient Active Problem List   Diagnosis Date Noted  . Upper airway cough syndrome 08/06/2017  . DOE (dyspnea on exertion) 08/04/2017  . MVP (mitral valve prolapse) 10/30/2011    Past Surgical History:  Procedure Laterality Date  . TONSILECTOMY, ADENOIDECTOMY, BILATERAL MYRINGOTOMY AND TUBES      OB History   No obstetric history on file.      Home Medications    Prior to Admission medications   Medication Sig Start Date End Date Taking? Authorizing Provider  escitalopram (LEXAPRO) 10 MG tablet Take 0.5 tablets (5 mg total) by mouth at bedtime. 03/24/18   Janace ArisBast, Townsend Cudworth A, NP  famotidine (PEPCID) 20 MG tablet One at bedtime 08/04/17   Nyoka CowdenWert, Michael B, MD  hydrOXYzine (ATARAX/VISTARIL) 25 MG tablet Take 1 tablet (25 mg total) by mouth every 6 (six) hours. 03/24/18   Dahlia ByesBast, Graylen Noboa A, NP  pantoprazole (PROTONIX) 40 MG tablet Take 1 tablet (40 mg total) by mouth daily. Take 30-60 min before first meal of the day 08/04/17   Nyoka CowdenWert, Michael B, MD    Family History Family History  Problem Relation Age of Onset  . Heart disease Mother   . Hypertension Mother   . Diabetes Mother   . Heart disease Father   . Hypertension Father     Social  History Social History   Tobacco Use  . Smoking status: Current Some Day Smoker    Packs/day: 0.50    Years: 20.00    Pack years: 10.00    Types: Cigarettes  . Smokeless tobacco: Never Used  Substance Use Topics  . Alcohol use: Yes    Alcohol/week: 1.0 standard drinks    Types: 1 Glasses of wine per week  . Drug use: No     Allergies   Sulfa antibiotics   Review of Systems Review of Systems  HENT: Positive for congestion, rhinorrhea and sore throat.   Respiratory: Positive for cough.   Musculoskeletal: Positive for myalgias.     Physical Exam Triage Vital Signs ED Triage Vitals [03/24/18 1159]  Enc Vitals Group     BP 101/63     Pulse Rate 80     Resp 14     Temp 97.8 F (36.6 C)     Temp Source Oral     SpO2 96 %     Weight 150 lb (68 kg)     Height 5\' 8"  (1.727 m)     Head Circumference      Peak Flow      Pain Score 0     Pain Loc  Pain Edu?      Excl. in GC?    No data found.  Updated Vital Signs BP 101/63 (BP Location: Right Arm)   Pulse 80   Temp 97.8 F (36.6 C) (Oral)   Resp 14   Ht 5\' 8"  (1.727 m)   Wt 150 lb (68 kg)   SpO2 96%   BMI 22.81 kg/m   Visual Acuity Right Eye Distance:   Left Eye Distance:   Bilateral Distance:    Right Eye Near:   Left Eye Near:    Bilateral Near:     Physical Exam Vitals signs and nursing note reviewed.  Constitutional:      General: She is not in acute distress.    Appearance: Normal appearance. She is well-developed. She is not ill-appearing.  HENT:     Head: Normocephalic and atraumatic.     Right Ear: Tympanic membrane and ear canal normal.     Left Ear: Tympanic membrane and ear canal normal.     Nose: Congestion and rhinorrhea present.     Mouth/Throat:     Pharynx: Oropharynx is clear.  Eyes:     Conjunctiva/sclera: Conjunctivae normal.  Neck:     Musculoskeletal: Neck supple.  Cardiovascular:     Rate and Rhythm: Normal rate and regular rhythm.     Heart sounds: No murmur.    Pulmonary:     Effort: Pulmonary effort is normal. No respiratory distress.     Breath sounds: Normal breath sounds.  Abdominal:     Palpations: Abdomen is soft.     Tenderness: There is no abdominal tenderness.  Musculoskeletal: Normal range of motion.  Skin:    General: Skin is warm and dry.  Neurological:     Mental Status: She is alert.  Psychiatric:        Mood and Affect: Mood normal.      UC Treatments / Results  Labs (all labs ordered are listed, but only abnormal results are displayed) Labs Reviewed  POCT INFLUENZA A/B    EKG None  Radiology No results found.  Procedures Procedures (including critical care time)  Medications Ordered in UC Medications - No data to display  Initial Impression / Assessment and Plan / UC Course  I have reviewed the triage vital signs and the nursing notes.  Pertinent labs & imaging results that were available during my care of the patient were reviewed by me and considered in my medical decision making (see chart for details).     Flu test negative Most likely viral URI Symptomatic treatment Pt currently doesn't have a PCP requesting refill for lexapro and hydroxyzine.  I feel this is appropriate Pt is going to contact  Primary care at [facility] to establish  Final Clinical Impressions(s) / UC Diagnoses   Final diagnoses:  Viral illness     Discharge Instructions     Your flu test was negative This is most likely a viral upper respiratory infection.  You can continue using the over-the-counter medicine or the natural remedies for your symptoms I am refilling your anxiety medication Follow up as needed for continued or worsening symptoms     ED Prescriptions    Medication Sig Dispense Auth. Provider   escitalopram (LEXAPRO) 10 MG tablet Take 0.5 tablets (5 mg total) by mouth at bedtime. 30 tablet Marlene Beidler A, NP   hydrOXYzine (ATARAX/VISTARIL) 25 MG tablet Take 1 tablet (25 mg total) by mouth every 6  (six) hours. 12 tablet FairburyBast, Ohioraci  A, NP     Controlled Substance Prescriptions [place] Controlled Substance Registry consulted? no   Janace Aris, NP 03/25/18 1445

## 2019-07-31 ENCOUNTER — Encounter: Payer: BLUE CROSS/BLUE SHIELD | Admitting: Registered Nurse

## 2019-08-01 ENCOUNTER — Encounter: Payer: Self-pay | Admitting: Registered Nurse

## 2019-08-14 ENCOUNTER — Encounter (HOSPITAL_COMMUNITY): Payer: Self-pay

## 2019-08-14 ENCOUNTER — Ambulatory Visit (HOSPITAL_COMMUNITY)
Admission: RE | Admit: 2019-08-14 | Discharge: 2019-08-14 | Disposition: A | Discharge status: Home / Self Care | Payer: 59 | Source: Ambulatory Visit

## 2019-08-14 ENCOUNTER — Other Ambulatory Visit: Payer: Self-pay

## 2019-08-14 VITALS — BP 100/69 | HR 75 | Resp 18

## 2019-08-14 DIAGNOSIS — R0789 Other chest pain: Secondary | ICD-10-CM

## 2019-08-14 DIAGNOSIS — R002 Palpitations: Secondary | ICD-10-CM

## 2019-08-14 DIAGNOSIS — F411 Generalized anxiety disorder: Secondary | ICD-10-CM

## 2019-08-14 DIAGNOSIS — I341 Nonrheumatic mitral (valve) prolapse: Secondary | ICD-10-CM

## 2019-08-14 DIAGNOSIS — R000 Tachycardia, unspecified: Secondary | ICD-10-CM

## 2019-08-14 MED ORDER — HYDROXYZINE HCL 25 MG PO TABS: 12.5000 mg | ORAL_TABLET | Freq: Three times a day (TID) | ORAL | 0 refills | Status: DC | PRN

## 2019-08-14 MED ORDER — FLUOXETINE HCL 10 MG PO TABS: 10.0000 mg | ORAL_TABLET | Freq: Every day | ORAL | 0 refills | Status: DC

## 2019-08-14 NOTE — ED Triage Notes (Signed)
Monday was the start of chest pain. Chest pain has been intermittent.  Patient did experience nausea and dizziness today.  Reports fit bit heart rate 120, normally 80-90.  Patient did take aspirin 81 mg x 2 this morning.  Pain resolved

## 2019-08-14 NOTE — ED Provider Notes (Signed)
[facility]   MRN: [id number] DOB: 03/26/1975  Subjective:   Kathryn Brown is a 44 y.o. female presenting for 3-day history of acute onset recurrent midsternal chest pain.  Symptoms have come and gone, experiences palpitation and heart racing.  States that she sometimes gets nausea and dizziness with it but this mostly happened today.  Has had history of chest pain, heart palpitations and heart murmur.  Her cardiologist is Dr. Mertie Moores.  She has not been back to him in a while.  They recommended she come to our urgent care for recheck and referral.  Patient thinks it is largely related to her stress, has been under a lot through work and with her daughter who is living with her/going through a divorce.  She has a history of anxiety but is not taking anything for this.  She has previously been prescribed Lexapro but has only used this on an occasional as-needed basis.  Would like to get something for this as she has not been back to see her PCP.  No current facility-administered medications for this encounter.  Current Outpatient Medications:  .  aspirin 81 MG chewable tablet, Chew by mouth daily. Took 2 aspirin 81 mg, Disp: , Rfl:  .  escitalopram (LEXAPRO) 10 MG tablet, Take 0.5 tablets (5 mg total) by mouth at bedtime., Disp: 30 tablet, Rfl: 0 .  famotidine (PEPCID) 20 MG tablet, One at bedtime, Disp: 30 tablet, Rfl: 11 .  hydrOXYzine (ATARAX/VISTARIL) 25 MG tablet, Take 1 tablet (25 mg total) by mouth every 6 (six) hours., Disp: 12 tablet, Rfl: 0 .  pantoprazole (PROTONIX) 40 MG tablet, Take 1 tablet (40 mg total) by mouth daily. Take 30-60 min before first meal of the day, Disp: 30 tablet, Rfl: 2   Allergies  Allergen Reactions  . Sulfa Antibiotics Anaphylaxis    Past Medical History:  Diagnosis Date  . Chest pain   . Heart murmur    as a child  . Heart palpitations   . History of stomach ulcers    as a child  . MVP (mitral valve prolapse) 10/30/2011     Past  Surgical History:  Procedure Laterality Date  . TONSILECTOMY, ADENOIDECTOMY, BILATERAL MYRINGOTOMY AND TUBES    . TONSILLECTOMY      Family History  Problem Relation Age of Onset  . Heart disease Mother   . Hypertension Mother   . Diabetes Mother   . Heart disease Father   . Hypertension Father     Social History   Tobacco Use  . Smoking status: Former Smoker    Packs/day: 0.50    Years: 20.00    Pack years: 10.00    Types: Cigarettes  . Smokeless tobacco: Never Used  Substance Use Topics  . Alcohol use: Yes    Alcohol/week: 1.0 standard drink    Types: 1 Glasses of wine per week  . Drug use: No    ROS   Objective:   Vitals: BP 100/69 (BP Location: Right Arm)   Pulse 75   Resp 18   LMP 07/30/2019   SpO2 100%   Physical Exam Constitutional:      General: She is not in acute distress.    Appearance: Normal appearance. She is well-developed. She is not ill-appearing, toxic-appearing or diaphoretic.  HENT:     Head: Normocephalic and atraumatic.     Nose: Nose normal.     Mouth/Throat:     Mouth: Mucous membranes are moist.  Eyes:  Extraocular Movements: Extraocular movements intact.     Pupils: Pupils are equal, round, and reactive to light.  Cardiovascular:     Rate and Rhythm: Normal rate and regular rhythm.     Pulses: Normal pulses.     Heart sounds: Normal heart sounds. No murmur heard.  No friction rub. No gallop.   Pulmonary:     Effort: Pulmonary effort is normal. No respiratory distress.     Breath sounds: Normal breath sounds. No stridor. No wheezing, rhonchi or rales.  Skin:    General: Skin is warm and dry.     Findings: No rash.  Neurological:     Mental Status: She is alert and oriented to person, place, and time.  Psychiatric:        Mood and Affect: Mood normal.        Behavior: Behavior normal.        Thought Content: Thought content normal.        Judgment: Judgment normal.     ED ECG REPORT   Date: 08/14/2019  Rate:  73bpm  Rhythm: normal sinus rhythm  QRS Axis: normal  Intervals: normal  ST/T Wave abnormalities: nonspecific T wave changes  Conduction Disutrbances:none  Narrative Interpretation: Sinus rhythm at 73 bpm with T wave inversion in V2, flattening in aVL.  The more recent EKGs have this change with T wave inversion in T wave flattening but older EKGs from 2013 are exactly the same.  No ST elevation, depression suggesting ACS.  Old EKG Reviewed: unchanged and changes noted  I have personally reviewed the EKG tracing and agree with the computerized printout as noted.   Assessment and Plan :   PDMP not reviewed this encounter.  1. Atypical chest pain   2. Palpitations   3. Racing heart beat   4. Mitral valve prolapse   5. Generalized anxiety disorder     Discussed EKG findings extensively with patient.  Patient prefers to avoid the emergency room as she has very low suspicion for her heart being an issue.  Has never had any heart disease, MI, abnormal heart work-up with her previous cardiologist.  Emphasized need to follow-up with them urgently, referral placed.  In the meantime, recommended patient start fluoxetine as her daughter is on this and has done well with it.  Offered hydroxyzine for as needed anxiety. Counseled patient on potential for adverse effects with medications prescribed/recommended today, ER and return-to-clinic precautions discussed, patient verbalized understanding.    Wallis Bamberg, PA-C 08/14/19 1311

## 2019-08-14 NOTE — ED Notes (Signed)
Spoke with mani, pa about patient complaint and reviewed EKG

## 2019-08-17 NOTE — Progress Notes (Signed)
Cardiology Office Note:   Date:  08/19/2019  NAME:  Kathryn Brown    MRN: [id number] DOB:  [birth year]   PCP:  Patient, No Pcp Per  Cardiologist:  No primary care provider on file.   Referring MD: Jaynee Eagles, PA-C   Chief Complaint  Patient presents with  . Chest Pain   History of Present Illness:   Kathryn Brown is a 44 y.o. female with a hx of depression who is being seen today for the evaluation of chest pain/palpitaitons at the request of Jaynee Eagles, [place]. Evaluated in the ER 08/14/2019 for atypical CP. EKG normal. Follow-up today.   She reports she had a constellation of symptoms including chest pain and palpitations for years.  Apparently over the past few months he become worse.  Symptoms are occurring daily now.  She reports she feels pressure at times, tightness or even sharp pain.  She reports the symptoms can occur at any time.  They are not associated with exertion.  She reports she was woken from sleep the other night with the same symptoms.  She also reports that when she exerts herself she does get rapid heartbeat sensation.  She apparently was diagnosed with mitral valve prolapse number of years ago but has not had reevaluation.  I cannot see the echocardiogram from 2004.  She reports the symptoms are bothersome and can be associated with symptoms of nausea.  She was evaluated the emergency room and then sent for evaluation here.  Her EKG today is very normal.  She does take Lexapro as well as Atarax for anxiety and depression.  She reports she does work in [employer] and this is a very stressful job.  She does go on walks and has no major limitations with that level of activity.  She does get symptoms at times with this.  It appears that her symptoms can occur with stress and without stress.  They are bothersome to her.  She does report the family history of heart disease in her parents.  No recent lipid profile.  She is a former smoker.  Past Medical History: Past  Medical History:  Diagnosis Date  . Chest pain   . Heart murmur   . Heart palpitations   . History of stomach ulcers    as a child  . MVP (mitral valve prolapse) 10/30/2011    Past Surgical History: Past Surgical History:  Procedure Laterality Date  . TONSILECTOMY, ADENOIDECTOMY, BILATERAL MYRINGOTOMY AND TUBES    . TONSILLECTOMY      Current Medications: Current Meds  Medication Sig  . aspirin 81 MG chewable tablet Chew by mouth daily. Took 2 aspirin 81 mg  . escitalopram (LEXAPRO) 10 MG tablet Take 0.5 tablets (5 mg total) by mouth at bedtime.  Marland Kitchen FLUoxetine (PROZAC) 10 MG tablet Take 1 tablet (10 mg total) by mouth daily.  . hydrOXYzine (ATARAX/VISTARIL) 25 MG tablet Take 0.5-1 tablets (12.5-25 mg total) by mouth every 8 (eight) hours as needed for anxiety.     Allergies:    Sulfa antibiotics   Social History: Social History   Socioeconomic History  . Marital status: Single    Spouse name: Not on file  . Number of children: Not on file  . Years of education: Not on file  . Highest education level: Not on file  Occupational History  . Not on file  Tobacco Use  . Smoking status: Former Smoker    Packs/day: 0.50    Years: 10.00  Pack years: 5.00    Types: Cigarettes  . Smokeless tobacco: Never Used  Substance and Sexual Activity  . Alcohol use: Yes    Alcohol/week: 1.0 standard drink    Types: 1 Glasses of wine per week  . Drug use: No  . Sexual activity: Not on file  Other Topics Concern  . Not on file  Social History Narrative  . Not on file   Social Determinants of Health   Financial Resource Strain:   . Difficulty of Paying Living Expenses:   Food Insecurity:   . Worried About Charity fundraiser in the Last Year:   . Arboriculturist in the Last Year:   Transportation Needs:   . Film/video editor (Medical):   Marland Kitchen Lack of Transportation (Non-Medical):   Physical Activity:   . Days of Exercise per Week:   . Minutes of Exercise per Session:     Stress:   . Feeling of Stress :   Social Connections:   . Frequency of Communication with Friends and Family:   . Frequency of Social Gatherings with Friends and Family:   . Attends Religious Services:   . Active Member of Clubs or Organizations:   . Attends Archivist Meetings:   Marland Kitchen Marital Status:      Family History: The patient's family history includes Diabetes in her mother; Heart disease in her father and mother; Hypertension in her father and mother.  ROS:   All other ROS reviewed and negative. Pertinent positives noted in the HPI.     EKGs/Labs/Other Studies Reviewed:   The following studies were personally reviewed by me today:  EKG:  EKG is ordered today.  The ekg ordered today demonstrates normal sinus rhythm, heart rate 90, no acute ST-T changes, no evidence of prior infarction, and was personally reviewed by me.   Recent Labs: No results found for requested labs within last 8760 hours.   Recent Lipid Panel No results found for: CHOL, TRIG, HDL, CHOLHDL, VLDL, LDLCALC, LDLDIRECT  Physical Exam:   VS:  BP 114/68   Pulse 90   Temp 98.3 F (36.8 C)   Ht 5' 8.5" (1.74 m)   Wt 147 lb (66.7 kg)   LMP 07/30/2019   SpO2 97%   BMI 22.03 kg/m    Wt Readings from Last 3 Encounters:  08/19/19 147 lb (66.7 kg)  03/24/18 150 lb (68 kg)  08/04/17 168 lb (76.2 kg)    General: Well nourished, well developed, in no acute distress Heart: Atraumatic, normal size  Eyes: PEERLA, EOMI  Neck: Supple, no JVD Endocrine: No thryomegaly Cardiac: Normal S1, S2; RRR; no murmurs, rubs, or gallops Lungs: Clear to auscultation bilaterally, no wheezing, rhonchi or rales  Abd: Soft, nontender, no hepatomegaly  Ext: No edema, pulses 2+ Musculoskeletal: No deformities, BUE and BLE strength normal and equal Skin: Warm and dry, no rashes   Neuro: Alert and oriented to person, place, time, and situation, CNII-XII grossly intact, no focal deficits  Psych: Normal mood and affect    ASSESSMENT:   Kathryn Brown is a 44 y.o. female who presents for the following: 1. Chest pain, unspecified type   2. Dizziness   3. Palpitations     PLAN:   1. Chest pain, unspecified type 2. Dizziness 3. Palpitations -She is had symptoms of chest pain dizziness and palpitations for years.  She does have a high stress job.  Possibly this is anxiety related.  She does need  a full panel of labs including a CBC, BMP, TSH.  We will also get a lipid profile as well.  Her chest pain is atypical.  Her EKG is normal sinus rhythm with no acute changes or evidence of prior infarction.  Given that she has had symptoms for 10 years we will proceed with coronary CTA and give her 1 her milligrams of metoprolol tartrate to our before the scan.  She also has a history of mitral valve prolapse.  This was diagnosed 20 years ago.  We should update an echocardiogram just to make sure she actually has mitral valve prolapse.  Her no murmurs on exam today.  Her exam is very benign.  Her episodes of palpitations occur daily.  We will also proceed with a 7-day Zio patch to exclude any significant arrhythmia.  Highly suspect all of her symptoms will be anxiety related but we will do our due diligence to make sure there is no cardiac pathology here.   Disposition: No follow-ups on file.  Medication Adjustments/Labs and Tests Ordered: Current medicines are reviewed at length with the patient today.  Concerns regarding medicines are outlined above.  Orders Placed This Encounter  Procedures  . CT CORONARY MORPH W/CTA COR W/SCORE W/CA W/CM &/OR WO/CM  . CT CORONARY FRACTIONAL FLOW RESERVE DATA PREP  . CT CORONARY FRACTIONAL FLOW RESERVE FLUID ANALYSIS  . Basic metabolic panel  . CBC  . TSH  . Lipid panel  . LONG TERM MONITOR (3-14 DAYS)  . EKG 12-Lead  . ECHOCARDIOGRAM COMPLETE   Meds ordered this encounter  Medications  . metoprolol tartrate (LOPRESSOR) 100 MG tablet    Sig: Take 1 tablet by mouth once for  procedure.    Dispense:  1 tablet    Refill:  0    Patient Instructions  Medication Instructions:  Take Metoprolol 100 mg two hours before the CT when scheduled  *If you need a refill on your cardiac medications before your next appointment, please call your pharmacy*   Lab Work: BMET, CBC, TSH, LIPID today   If you have labs (blood work) drawn today and your tests are completely normal, you will receive your results only by: Marland Kitchen MyChart Message (if you have MyChart) OR . A paper copy in the mail If you have any lab test that is abnormal or we need to change your treatment, we will call you to review the results.   Testing/Procedures: Your physician has requested that you have cardiac CT. Cardiac computed tomography (CT) is a painless test that uses an x-ray machine to take clear, detailed pictures of your heart. For further information please visit [email or web address].[email or web address]. Please follow instruction sheet as given.   Echocardiogram - Your physician has requested that you have an echocardiogram. Echocardiography is a painless test that uses sound waves to create images of your heart. It provides your doctor with information about the size and shape of your heart and how well your heart's chambers and valves are working. This procedure takes approximately one hour. There are no restrictions for this procedure. This will be performed at our [facility] location - [address].  Your physician has recommended that you wear a 7 DAY ZIO-PATCH monitor. The Zio patch cardiac monitor continuously records heart rhythm data for up to 14 days, this is for patients being evaluated for multiple types heart rhythms. For the first 24 hours post application, please avoid getting the Zio monitor wet in the shower or by excessive  sweating during exercise. After that, feel free to carry on with regular activities. Keep soaps and lotions away from the ZIO XT Patch.  This will be mailed to you, please  expect 7-10 days to receive.         Follow-Up: At [facility], you and your health needs are our priority.  As part of our continuing mission to provide you with exceptional heart care, we have created designated Provider Care Teams.  These Care Teams include your primary Cardiologist (physician) and Advanced Practice Providers (APPs -  Physician Assistants and Nurse Practitioners) who all work together to provide you with the care you need, when you need it.  We recommend signing up for the patient portal called "MyChart".  Sign up information is provided on this After Visit Summary.  MyChart is used to connect with patients for Virtual Visits (Telemedicine).  Patients are able to view lab/test results, encounter notes, upcoming appointments, etc.  Non-urgent messages can be sent to your provider as well.   To learn more about what you can do with MyChart, go to [email or web address].    Your next appointment:   3 month(s)  The format for your next appointment:   In Person  Provider:   Eleonore Chiquito, MD   Other Instructions Your cardiac CT will be scheduled at one of the below locations:   [facility] [address] [place], [place] [address] [phone]   If scheduled at [facility], please arrive at the [facility] main entrance of [facility] 30 minutes prior to test start time. Proceed to the [facility] Department (first floor) to check-in and test prep.   Please follow these instructions carefully (unless otherwise directed):   On the Night Before the Test: . Be sure to Drink plenty of water. . Do not consume any caffeinated/decaffeinated beverages or chocolate 12 hours prior to your test. . Do not take any antihistamines 12 hours prior to your test.  On the Day of the Test: . Drink plenty of water. Do not drink any water within one hour of the test. . Do not eat any food 4 hours prior to the test. . You may take your  regular medications prior to the test.  . Take metoprolol (Lopressor) two hours prior to test. . HOLD Furosemide/Hydrochlorothiazide morning of the test. . FEMALES- please wear underwire-free bra if available       After the Test: . Drink plenty of water. . After receiving IV contrast, you may experience a mild flushed feeling. This is normal. . On occasion, you may experience a mild rash up to 24 hours after the test. This is not dangerous. If this occurs, you can take Benadryl 25 mg and increase your fluid intake. . If you experience trouble breathing, this can be serious. If it is severe call 911 IMMEDIATELY. If it is mild, please call our office. . If you take any of these medications: Glipizide/Metformin, Avandament, Glucavance, please do not take 48 hours after completing test unless otherwise instructed.   Once we have confirmed authorization from your insurance company, we will call you to set up a date and time for your test.   For non-scheduling related questions, please contact the cardiac imaging nurse navigator should you have any questions/concerns: Marchia Bond, Cardiac Imaging Nurse Navigator Burley Saver, Interim Cardiac Imaging Nurse [place] and Vascular Services Direct Office Dial: [phone]   For scheduling needs, including cancellations and rescheduling, please call  [phone].       Signed, Addison Naegeli. Audie Box, [place]  [address] [place], [place] [address] [phone]  08/19/2019 4:24 PM

## 2019-08-19 ENCOUNTER — Encounter: Payer: Self-pay | Admitting: Cardiovascular Disease

## 2019-08-19 ENCOUNTER — Other Ambulatory Visit: Payer: Self-pay

## 2019-08-19 ENCOUNTER — Ambulatory Visit (INDEPENDENT_AMBULATORY_CARE_PROVIDER_SITE_OTHER): Payer: 59 | Admitting: Cardiovascular Disease

## 2019-08-19 VITALS — BP 114/68 | HR 90 | Temp 98.3°F | Ht 68.5 in | Wt 147.0 lb

## 2019-08-19 DIAGNOSIS — R42 Dizziness and giddiness: Secondary | ICD-10-CM | POA: Diagnosis not present

## 2019-08-19 DIAGNOSIS — R002 Palpitations: Secondary | ICD-10-CM | POA: Diagnosis not present

## 2019-08-19 DIAGNOSIS — R079 Chest pain, unspecified: Secondary | ICD-10-CM

## 2019-08-19 MED ORDER — METOPROLOL TARTRATE 100 MG PO TABS: ORAL_TABLET | ORAL | 0 refills | Status: DC

## 2019-08-19 NOTE — Patient Instructions (Signed)
Medication Instructions:  Take Metoprolol 100 mg two hours before the CT when scheduled  *If you need a refill on your cardiac medications before your next appointment, please call your pharmacy*   Lab Work: BMET, CBC, TSH, LIPID today   If you have labs (blood work) drawn today and your tests are completely normal, you will receive your results only by: Marland Kitchen MyChart Message (if you have MyChart) OR . A paper copy in the mail If you have any lab test that is abnormal or we need to change your treatment, we will call you to review the results.   Testing/Procedures: Your physician has requested that you have cardiac CT. Cardiac computed tomography (CT) is a painless test that uses an x-ray machine to take clear, detailed pictures of your heart. For further information please visit [email or web address].[email or web address]. Please follow instruction sheet as given.   Echocardiogram - Your physician has requested that you have an echocardiogram. Echocardiography is a painless test that uses sound waves to create images of your heart. It provides your doctor with information about the size and shape of your heart and how well your heart's chambers and valves are working. This procedure takes approximately one hour. There are no restrictions for this procedure. This will be performed at our [facility] location - [address], [address].  Your physician has recommended that you wear a 7 DAY ZIO-PATCH monitor. The Zio patch cardiac monitor continuously records heart rhythm data for up to 14 days, this is for patients being evaluated for multiple types heart rhythms. For the first 24 hours post application, please avoid getting the Zio monitor wet in the shower or by excessive sweating during exercise. After that, feel free to carry on with regular activities. Keep soaps and lotions away from the ZIO XT Patch.  This will be mailed to you, please expect 7-10 days to receive.         Follow-Up: At [facility], you  and your health needs are our priority.  As part of our continuing mission to provide you with exceptional heart care, we have created designated Provider Care Teams.  These Care Teams include your primary Cardiologist (physician) and Advanced Practice Providers (APPs -  Physician Assistants and Nurse Practitioners) who all work together to provide you with the care you need, when you need it.  We recommend signing up for the patient portal called "MyChart".  Sign up information is provided on this After Visit Summary.  MyChart is used to connect with patients for Virtual Visits (Telemedicine).  Patients are able to view lab/test results, encounter notes, upcoming appointments, etc.  Non-urgent messages can be sent to your provider as well.   To learn more about what you can do with MyChart, go to [email or web address].    Your next appointment:   3 month(s)  The format for your next appointment:   In Person  Provider:   Eleonore Chiquito, MD   Other Instructions Your cardiac CT will be scheduled at one of the below locations:   [facility] [address] [place], [place] [address] [phone]   If scheduled at [facility], please arrive at the [facility] main entrance of [facility] 30 minutes prior to test start time. Proceed to the [facility]iology Department (first floor) to check-in and test prep.   Please follow these instructions carefully (unless otherwise directed):   On the Night Before the Test: . Be sure to Drink plenty of  water. . Do not consume any caffeinated/decaffeinated beverages or chocolate 12 hours prior to your test. . Do not take any antihistamines 12 hours prior to your test.  On the Day of the Test: . Drink plenty of water. Do not drink any water within one hour of the test. . Do not eat any food 4 hours prior to the test. . You may take your regular medications prior to the test.  . Take metoprolol (Lopressor) two  hours prior to test. . HOLD Furosemide/Hydrochlorothiazide morning of the test. . FEMALES- please wear underwire-free bra if available       After the Test: . Drink plenty of water. . After receiving IV contrast, you may experience a mild flushed feeling. This is normal. . On occasion, you may experience a mild rash up to 24 hours after the test. This is not dangerous. If this occurs, you can take Benadryl 25 mg and increase your fluid intake. . If you experience trouble breathing, this can be serious. If it is severe call 911 IMMEDIATELY. If it is mild, please call our office. . If you take any of these medications: Glipizide/Metformin, Avandament, Glucavance, please do not take 48 hours after completing test unless otherwise instructed.   Once we have confirmed authorization from your insurance company, we will call you to set up a date and time for your test.   For non-scheduling related questions, please contact the cardiac imaging nurse navigator should you have any questions/concerns: Marchia Bond, Cardiac Imaging Nurse Navigator Burley Saver, Interim Cardiac Imaging Nurse [place] and Vascular Services Direct Office Dial: [phone]   For scheduling needs, including cancellations and rescheduling, please call [phone].

## 2019-08-20 LAB — BASIC METABOLIC PANEL
BUN/Creatinine Ratio: 15 (ref 9–23)
BUN: 12 mg/dL (ref 6–24)
CO2: 24 mmol/L (ref 20–29)
Calcium: 9.3 mg/dL (ref 8.7–10.2)
Chloride: 103 mmol/L (ref 96–106)
Creatinine, Ser: 0.82 mg/dL (ref 0.57–1.00)
GFR calc Af Amer: 100 mL/min/{1.73_m2} (ref >59)
GFR calc non Af Amer: 87 mL/min/{1.73_m2} (ref >59)
Glucose: 107 mg/dL — ABNORMAL HIGH (ref 65–99)
Potassium: 3.9 mmol/L (ref 3.5–5.2)
Sodium: 140 mmol/L (ref 134–144)

## 2019-08-20 LAB — CBC
Hematocrit: 37.6 % (ref 34.0–46.6)
Hemoglobin: 12.8 g/dL (ref 11.1–15.9)
MCH: 29.6 pg (ref 26.6–33.0)
MCHC: 34 g/dL (ref 31.5–35.7)
MCV: 87 fL (ref 79–97)
Platelets: 259 10*3/uL (ref 150–450)
RBC: 4.32 x10E6/uL (ref 3.77–5.28)
RDW: 13.2 % (ref 11.7–15.4)
WBC: 7.5 10*3/uL (ref 3.4–10.8)

## 2019-08-20 LAB — LIPID PANEL
Chol/HDL Ratio: 3.2 ratio (ref 0.0–4.4)
Cholesterol, Total: 237 mg/dL — ABNORMAL HIGH (ref 100–199)
HDL: 74 mg/dL (ref >39)
LDL Chol Calc (NIH): 144 mg/dL — ABNORMAL HIGH (ref 0–99)
Triglycerides: 112 mg/dL (ref 0–149)
VLDL Cholesterol Cal: 19 mg/dL (ref 5–40)

## 2019-08-20 LAB — TSH: TSH: 0.705 u[IU]/mL (ref 0.450–4.500)

## 2019-08-23 ENCOUNTER — Other Ambulatory Visit: Payer: Self-pay

## 2019-08-23 ENCOUNTER — Encounter: Payer: Self-pay | Admitting: Registered Nurse

## 2019-08-23 ENCOUNTER — Ambulatory Visit: Payer: 59 | Admitting: Registered Nurse

## 2019-08-23 VITALS — BP 96/63 | HR 71 | Temp 98.4°F | Ht 69.0 in | Wt 149.8 lb

## 2019-08-23 DIAGNOSIS — F411 Generalized anxiety disorder: Secondary | ICD-10-CM | POA: Diagnosis not present

## 2019-08-23 DIAGNOSIS — G2581 Restless legs syndrome: Secondary | ICD-10-CM | POA: Diagnosis not present

## 2019-08-23 MED ORDER — FLUOXETINE HCL 10 MG PO CAPS: 10.0000 mg | ORAL_CAPSULE | Freq: Every day | ORAL | 1 refills | Status: DC

## 2019-08-23 MED ORDER — GABAPENTIN 100 MG PO CAPS: 100.0000 mg | ORAL_CAPSULE | Freq: Every evening | ORAL | 3 refills | Status: DC | PRN

## 2019-08-23 NOTE — Patient Instructions (Signed)
° ° ° °  If you have lab work done today you will be contacted with your lab results within the next 2 weeks.  If you have not heard from us then please contact us. The fastest way to get your results is to register for My Chart. ° ° °IF you received an x-ray today, you will receive an invoice from [facility]. Please contact [facility] at [phone] with questions or concerns regarding your invoice.  ° °IF you received labwork today, you will receive an invoice from [facility]. Please contact [facility] at [phone] with questions or concerns regarding your invoice.  ° °Our billing staff will not be able to assist you with questions regarding bills from these companies. ° °You will be contacted with the lab results as soon as they are available. The fastest way to get your results is to activate your My Chart account. Instructions are located on the last page of this paperwork. If you have not heard from us regarding the results in 2 weeks, please contact this office. °  ° ° ° °

## 2019-08-23 NOTE — Progress Notes (Signed)
**Note De-Identified Kathryn Obfuscation** Established Patient Office Visit  Subjective:  Patient ID: Kathryn Brown, female    DOB: 02-Sep-1975  Age: 44 y.o. MRN: [id number]  CC:  Chief Complaint  Patient presents with   Medication Management     Prozac and would like the capsules   Panic attacks    HPI Kathryn Brown presents for med refills and visit to est care.  Was originally scheduled a few weeks from now for [facility] from [place]. However, was having chest pain, scheduled sooner. Chest pain worsened, went to Urgent Care. Determined likely anxiety, was restarted on prozac 10mg  PO qd and hydroxyzine 10mg  PO tid PRN for breakthrough anxiety and palpitations.  Feeling well now. Glad to have restarted the prozac. Had been on this in the past at a higher dose, interested in titrating up as needed.   Interested in medication for RLS - looks like she has been on Lexapro 5mg  PO qhs for this in the past which she reports had good effect. Open to other agents  Otherwise, no concerns. Needs to be reestablished with a PCP  Past Medical History:  Diagnosis Date   Chest pain    Heart murmur    Heart palpitations    History of stomach ulcers    as a child   MVP (mitral valve prolapse) 10/30/2011    Past Surgical History:  Procedure Laterality Date   TONSILECTOMY, ADENOIDECTOMY, BILATERAL MYRINGOTOMY AND TUBES     TONSILLECTOMY      Family History  Problem Relation Age of Onset   Heart disease Mother    Hypertension Mother    Diabetes Mother    Heart disease Father    Hypertension Father     Social History   Socioeconomic History   Marital status: Single    Spouse name: Not on file   Number of children: Not on file   Years of education: Not on file   Highest education level: Not on file  Occupational History   Not on file  Tobacco Use   Smoking status: Former Smoker    Packs/day: 0.50    Years: 10.00    Pack years: 5.00    Types: Cigarettes   Smokeless tobacco: Never Used  Substance and  Sexual Activity   Alcohol use: Yes    Alcohol/week: 1.0 standard drink    Types: 1 Glasses of wine per week   Drug use: No   Sexual activity: Not on file  Other Topics Concern   Not on file  Social History Narrative   Not on file   Social Determinants of Health   Financial Resource Strain:    Difficulty of Paying Living Expenses:   Food Insecurity:    Worried About in the Last Year:    in the Last Year:   Transportation Needs:    12/30/2011 (Medical):    Lack of Transportation (Non-Medical):   Physical Activity:    Days of Exercise per Week:    Minutes of Exercise per Session:   Stress:    Feeling of Stress :   Social Connections:    Frequency of Communication with Friends and Family:    Frequency of Social Gatherings with Friends and Family:    Attends Religious Services:    Active Member of Clubs or Organizations:    Attends Programme researcher, broadcasting/film/video Meetings:    Marital Status:   Intimate Partner Violence:    Fear of Current or Ex-Partner:  Emotionally Abused:    Physically Abused:    Sexually Abused:     Outpatient Medications Prior to Visit  Medication Sig Dispense Refill   FLUoxetine (PROZAC) 10 MG tablet Take 1 tablet (10 mg total) by mouth daily. 90 tablet 0   hydrOXYzine (ATARAX/VISTARIL) 25 MG tablet Take 0.5-1 tablets (12.5-25 mg total) by mouth every 8 (eight) hours as needed for anxiety. 30 tablet 0   metoprolol tartrate (LOPRESSOR) 100 MG tablet Take 1 tablet by mouth once for procedure. 1 tablet 0   aspirin 81 MG chewable tablet Chew by mouth daily. Took 2 aspirin 81 mg     escitalopram (LEXAPRO) 10 MG tablet Take 0.5 tablets (5 mg total) by mouth at bedtime. 30 tablet 0   No facility-administered medications prior to visit.    Allergies  Allergen Reactions   Sulfa Antibiotics Anaphylaxis    ROS Review of Systems Per hpi, otherwise negative   Objective:    Physical  Exam Vitals and nursing note reviewed.  Constitutional:      General: She is not in acute distress.    Appearance: Normal appearance. She is normal weight. She is not ill-appearing, toxic-appearing or diaphoretic.  Neurological:     General: No focal deficit present.     Mental Status: She is alert and oriented to person, place, and time. Mental status is at baseline.  Psychiatric:        Mood and Affect: Mood normal.        Behavior: Behavior normal.        Thought Content: Thought content normal.        Judgment: Judgment normal.     BP 96/63    Pulse 71    Temp 98.4 F (36.9 C) (Temporal)    Ht 5\' 9"  (1.753 m)    Wt 149 lb 12.8 oz (67.9 kg)    LMP 07/30/2019    SpO2 98%    BMI 22.12 kg/m  Wt Readings from Last 3 Encounters:  08/23/19 149 lb 12.8 oz (67.9 kg)  08/19/19 147 lb (66.7 kg)  03/24/18 150 lb (68 kg)     Health Maintenance Due  Topic Date Due   Hepatitis C Screening  Never done   COVID-19 Vaccine (1) Never done   HIV Screening  Never done   TETANUS/TDAP  Never done   PAP SMEAR-Modifier  Never done    There are no preventive care reminders to display for this patient.  Lab Results  Component Value Date   TSH 0.705 08/19/2019   Lab Results  Component Value Date   WBC 7.5 08/19/2019   HGB 12.8 08/19/2019   HCT 37.6 08/19/2019   MCV 87 08/19/2019   PLT 259 08/19/2019   Lab Results  Component Value Date   NA 140 08/19/2019   K 3.9 08/19/2019   CO2 24 08/19/2019   GLUCOSE 107 (H) 08/19/2019   BUN 12 08/19/2019   CREATININE 0.82 08/19/2019   BILITOT 0.3 05/25/2017   ALKPHOS 76 05/25/2017   AST 15 05/25/2017   ALT 27 05/25/2017   PROT 7.3 05/25/2017   ALBUMIN 4.7 05/25/2017   CALCIUM 9.3 08/19/2019   ANIONGAP 7 07/28/2016   Lab Results  Component Value Date   CHOL 237 (H) 08/19/2019   Lab Results  Component Value Date   HDL 74 08/19/2019   Lab Results  Component Value Date   LDLCALC 144 (H) 08/19/2019   Lab Results  Component  Value Date   TRIG  112 08/19/2019   Lab Results  Component Value Date   CHOLHDL 3.2 08/19/2019   No results found for: HGBA1C    Assessment & Plan:   Problem List Items Addressed This Visit    None    Visit Diagnoses    GAD (generalized anxiety disorder)    -  Primary   Relevant Medications   FLUoxetine (PROZAC) 10 MG capsule   Restless legs       Relevant Medications   gabapentin (NEURONTIN) 100 MG capsule   Other Relevant Orders   Vitamin D, 25-hydroxy   Vitamin B12   Magnesium      Meds ordered this encounter  Medications   FLUoxetine (PROZAC) 10 MG capsule    Sig: Take 1 capsule (10 mg total) by mouth daily.    Dispense:  90 capsule    Refill:  1   gabapentin (NEURONTIN) 100 MG capsule    Sig: Take 1 capsule (100 mg total) by mouth at bedtime as needed.    Dispense:  30 capsule    Refill:  3    Order Specific Question:   Supervising Provider    Answer:   Neva Seat, JEFFREY R [[id number]]    Follow-up: No follow-ups on file.   PLAN  Refill fluoxetine. She may titrate up to 20mg  PO qd if desired. Wait at least 1 week on dose before titrating up. Max dose 40mg  before OV  Gabapentin 100mg  PO qhs for RLS. Will draw labs to investigate contributing causes  Patient encouraged to call clinic with any questions, comments, or concerns.  , NP

## 2019-08-24 LAB — VITAMIN B12: Vitamin B-12: 302 pg/mL (ref 232–1245)

## 2019-08-24 LAB — MAGNESIUM: Magnesium: 2 mg/dL (ref 1.6–2.3)

## 2019-08-24 LAB — VITAMIN D 25 HYDROXY (VIT D DEFICIENCY, FRACTURES): Vit D, 25-Hydroxy: 11.8 ng/mL — ABNORMAL LOW (ref 30.0–100.0)

## 2019-08-26 ENCOUNTER — Other Ambulatory Visit: Payer: Self-pay | Admitting: Registered Nurse

## 2019-08-26 DIAGNOSIS — E559 Vitamin D deficiency, unspecified: Secondary | ICD-10-CM | POA: Insufficient documentation

## 2019-08-26 MED ORDER — VITAMIN D (ERGOCALCIFEROL) 1.25 MG (50000 UNIT) PO CAPS: 50000.0000 [IU] | ORAL_CAPSULE | ORAL | 0 refills | Status: DC

## 2019-08-26 NOTE — Progress Notes (Signed)
If we could call Ms. Kathryn Brown -   Her Vit D is very low, I'll send over a prescription strength supplement. This should help her feel better. Take once weekly for 8 weeks, then we can recheck  Thanks,  Jari Sportsman, NP

## 2019-08-27 ENCOUNTER — Telehealth (HOSPITAL_COMMUNITY): Payer: Self-pay | Admitting: *Deleted

## 2019-08-27 ENCOUNTER — Telehealth: Payer: Self-pay | Admitting: Registered Nurse

## 2019-08-27 NOTE — Telephone Encounter (Signed)
Copied from CRM [id number]. Topic: Quick Conservator, museum/gallery Patient (Clinic Use ONLY) >> Aug 27, 2019 12:40 PM Leafy Ro wrote: Reason for CRM: pt is returning call concerning blood work results

## 2019-08-27 NOTE — Telephone Encounter (Signed)
Pt calling wanting lab results

## 2019-08-27 NOTE — Telephone Encounter (Signed)

## 2019-08-28 ENCOUNTER — Ambulatory Visit (HOSPITAL_COMMUNITY)
Admission: RE | Admit: 2019-08-28 | Discharge: 2019-08-28 | Disposition: A | Discharge status: Home / Self Care | Payer: 59 | Source: Ambulatory Visit | Attending: Cardiovascular Disease | Admitting: Cardiovascular Disease

## 2019-08-28 ENCOUNTER — Encounter (HOSPITAL_COMMUNITY): Payer: Self-pay

## 2019-08-28 ENCOUNTER — Other Ambulatory Visit: Payer: Self-pay

## 2019-08-28 DIAGNOSIS — R079 Chest pain, unspecified: Secondary | ICD-10-CM

## 2019-08-28 MED ORDER — SODIUM CHLORIDE 0.9 % IV BOLUS
1000.0000 mL | Freq: Once | INTRAVENOUS | Status: AC
  Administered 2019-08-28: 1000 mL via INTRAVENOUS

## 2019-08-28 MED ORDER — NITROGLYCERIN 0.4 MG SL SUBL: 0.8000 mg | SUBLINGUAL_TABLET | Freq: Once | SUBLINGUAL | Status: DC

## 2019-08-28 MED ORDER — ATROPINE SULFATE 1 MG/ML IJ SOLN
INTRAMUSCULAR | Status: AC
  Filled 2019-08-28: qty 1

## 2019-08-28 NOTE — Progress Notes (Signed)
Had coffee, diet coke and cookies. Feels better than when she came in. Discharge instructions to drink lots of fluids and caffeine drinks today. Aware Dr Di Kindle nurse will call her to discuss options. SBP in the 90's sitting and standing. HR in the 50's now. Discharged walking with significant other. He will stay with her.

## 2019-08-28 NOTE — Progress Notes (Signed)
Pt reports chest pain 4/10 in her mid to lower chest. She states that this is the same pain she's had previously and this is the reason why she is having ct heart scan

## 2019-08-28 NOTE — Progress Notes (Signed)
Chest pain resolved. BP 91/56. Color pink and skin warm and dry. BP 91/56 HR 44

## 2019-08-28 NOTE — Progress Notes (Signed)
Dr Cristal Deer updated re heart rate still drops briefly to high 30's to 45. Aware SBP 93 and that she had a brief episode of chest pain. To give her some caffeine and something to eat and instruct her to drink caffeine at home with plenty of fluids. Also Dr Di Kindle nurse will call her to reschedule test or discuss options. Patient aware of these instructions.

## 2019-08-28 NOTE — Progress Notes (Signed)
NS hung at rate of wide open. Dr Cristal Deer called to see how to proceed. BP currently 94 54. To give patient 1000cc of NS IV. If BP elevates to 120 systolic to call her back and may proceed with the CT heart. Otherwise reschedule test and may discharge once she is feeling better.

## 2019-08-28 NOTE — Progress Notes (Signed)
Monitor heart rate is 36, feels like she is going to pass out. Recliner put back and feet elevated. Cool cloth given. BP 107 53. And heart rate up to 47.

## 2019-08-28 NOTE — Progress Notes (Signed)
Heart rate 37 to 45, BP 135/69. Patient states feels almost normal. Will wait till the fluid bolus is complete and then review with Dr Cristal Deer. Color pink and skin warm and dry. No chest pain.

## 2019-08-29 ENCOUNTER — Telehealth: Payer: Self-pay | Admitting: Cardiovascular Disease

## 2019-08-29 ENCOUNTER — Other Ambulatory Visit: Payer: Self-pay

## 2019-08-29 DIAGNOSIS — R079 Chest pain, unspecified: Secondary | ICD-10-CM

## 2019-08-29 NOTE — Telephone Encounter (Signed)
Pt c/o medication issue:  1. Name of Medication: metoprolol tartrate (LOPRESSOR) 100 MG tablet  2. How are you currently taking this medication (dosage and times per day)? N/a - took for procedure that was scheduled yesterday  3. Are you having a reaction (difficulty breathing--STAT)? yes  4. What is your medication issue? Patient states that her HR is still running low. She states it is staying in the mid 40's-50's but did get up in the 60's once. She states her BP is normal, 97/59, 99/55 and 99/54 HR 42. She states she does have some dizziness and headaches.

## 2019-08-29 NOTE — Progress Notes (Signed)
Cardiology Office Note:   Date:  08/30/2019  NAME:  Kathryn Brown    MRN: [id number] DOB:  [birth year]   PCP:  Maximiano Coss, NP  Cardiologist:  No primary care provider on file.   Referring MD: Maximiano Coss, NP   Chief Complaint  Patient presents with  . Palpitations  . Chest Pain   History of Present Illness:   Kathryn Brown is a 44 y.o. female with a hx of depression who presents for follow-up of chest pain and palpitations. She was evaluated in late June for chest pain that was atypical. We attempted CCTA but she had bradycardia with metoprolol. Labs showed normal cholesterol. TSH normal. Has had low heart rates and follow up today.   She reports 1 day prior to attempting to complete her CT scan she became what she describes as funky.  She reports she had a dizziness and fogginess to her mental state.  She reports activities such as changing position will get her dizzy and lightheaded.  She is had no syncopal events.  She did take the metoprolol the day of her CT scan and her heart was low her blood pressure was very low.  Her symptoms did resolve with intravenous fluids.  She was sent home.  She reports she is just not felt well since that time and wanted to be evaluated.  Her EKG today demonstrates sinus bradycardia with a heart rate 49.  She has a narrow QRS and normal intervals.  There is no evidence of conduction disease.  Her cardiovascular exam today is benign.  There is no evidence of murmurs rubs or gallops.  There is no evidence of heart failure.  She is still having intermittent episodes of chest pain.  Reported a sharp pain can occur anytime.  Can last seconds to minutes.  No identifiable trigger and no alleviating factor.  She reports time and deep breathing can improve this.  She does present with her significant other today who had a heart stent.  She is very concerned about her overall heart health.  We did talk about pursuing the CT scan again without metoprolol versus a  stress test.  She is opted to repeat the CT scan.  We will try to do this next week.  Of note, she was started on Prozac about 10 days before her last visit.  Prozac does have a side effect of drowsiness and dizziness.  I do wonder if this is a medication side effect.  I have asked her to stop the Prozac for now.  She will do that.  She also does not drink enough water.  She only endorses 3-20 ounce bottles per day.  I have asked her to double if not triple this.  I also asked her to increase her salt intake.  Stress level is at higher than normal.  I do think anxiety and depression can play a role here.  She is also a Management consultant.  I asked her to take today off and take the weekend off to relax and recharge.  Total cholesterol 237, HDL 74, LDL 144, triglycerides 112  TSH 0.70  Past Medical History: Past Medical History:  Diagnosis Date  . Chest pain   . Heart murmur   . Heart palpitations   . History of stomach ulcers    as a child  . MVP (mitral valve prolapse) 10/30/2011    Past Surgical History: Past Surgical History:  Procedure Laterality Date  . TONSILECTOMY, ADENOIDECTOMY, BILATERAL MYRINGOTOMY AND  TUBES    . TONSILLECTOMY      Current Medications: Current Meds  Medication Sig  . FLUoxetine (PROZAC) 10 MG capsule Take 1 capsule (10 mg total) by mouth daily.  Marland Kitchen FLUoxetine (PROZAC) 10 MG tablet Take 1 tablet (10 mg total) by mouth daily.  Marland Kitchen gabapentin (NEURONTIN) 100 MG capsule Take 1 capsule (100 mg total) by mouth at bedtime as needed.  . hydrOXYzine (ATARAX/VISTARIL) 25 MG tablet Take 0.5-1 tablets (12.5-25 mg total) by mouth every 8 (eight) hours as needed for anxiety.  . metoprolol tartrate (LOPRESSOR) 100 MG tablet Take 1 tablet by mouth once for procedure.  . Vitamin D, Ergocalciferol, (DRISDOL) 1.25 MG (50000 UNIT) CAPS capsule Take 1 capsule (50,000 Units total) by mouth every 7 (seven) days.     Allergies:    Sulfa antibiotics   Social History: Social History    Socioeconomic History  . Marital status: Single    Spouse name: Not on file  . Number of children: Not on file  . Years of education: Not on file  . Highest education level: Not on file  Occupational History  . Not on file  Tobacco Use  . Smoking status: Former Smoker    Packs/day: 0.50    Years: 10.00    Pack years: 5.00    Types: Cigarettes  . Smokeless tobacco: Never Used  Substance and Sexual Activity  . Alcohol use: Yes    Alcohol/week: 1.0 standard drink    Types: 1 Glasses of wine per week  . Drug use: No  . Sexual activity: Not on file  Other Topics Concern  . Not on file  Social History Narrative  . Not on file   Social Determinants of Health   Financial Resource Strain:   . Difficulty of Paying Living Expenses:   Food Insecurity:   . Worried About Charity fundraiser in the Last Year:   . Arboriculturist in the Last Year:   Transportation Needs:   . Film/video editor (Medical):   Marland Kitchen Lack of Transportation (Non-Medical):   Physical Activity:   . Days of Exercise per Week:   . Minutes of Exercise per Session:   Stress:   . Feeling of Stress :   Social Connections:   . Frequency of Communication with Friends and Family:   . Frequency of Social Gatherings with Friends and Family:   . Attends Religious Services:   . Active Member of Clubs or Organizations:   . Attends Archivist Meetings:   Marland Kitchen Marital Status:      Family History: The patient's family history includes Diabetes in her mother; Heart disease in her father and mother; Hypertension in her father and mother.  ROS:   All other ROS reviewed and negative. Pertinent positives noted in the HPI.     EKGs/Labs/Other Studies Reviewed:   The following studies were personally reviewed by me today:  EKG:  EKG is ordered today.  The ekg ordered today demonstrates sinus bradycardia, heart rate 49, no acute ST-T changes, no evidence of prior infarction, and was personally reviewed by me.    Recent Labs: 08/19/2019: BUN 12; Creatinine, Ser 0.82; Hemoglobin 12.8; Platelets 259; Potassium 3.9; Sodium 140; TSH 0.705 08/23/2019: Magnesium 2.0   Recent Lipid Panel    Component Value Date/Time   CHOL 237 (H) 08/19/2019 1446   TRIG 112 08/19/2019 1446   HDL 74 08/19/2019 1446   CHOLHDL 3.2 08/19/2019 1446   LDLCALC 144 (H)  08/19/2019 1446    Physical Exam:   VS:  BP 102/62   Pulse (!) 49   Temp 97.7 F (36.5 C)   Ht 5' 9"  (1.753 m)   Wt 151 lb 3.2 oz (68.6 kg)   SpO2 99%   BMI 22.33 kg/m    Wt Readings from Last 3 Encounters:  08/30/19 151 lb 3.2 oz (68.6 kg)  08/23/19 149 lb 12.8 oz (67.9 kg)  08/19/19 147 lb (66.7 kg)    General: Well nourished, well developed, in no acute distress Heart: Atraumatic, normal size  Eyes: PEERLA, EOMI  Neck: Supple, no JVD Endocrine: No thryomegaly Cardiac: Normal S1, S2; RRR; no murmurs, rubs, or gallops Lungs: Clear to auscultation bilaterally, no wheezing, rhonchi or rales  Abd: Soft, nontender, no hepatomegaly  Ext: No edema, pulses 2+ Musculoskeletal: No deformities, BUE and BLE strength normal and equal Skin: Warm and dry, no rashes   Neuro: Alert and oriented to person, place, time, and situation, CNII-XII grossly intact, no focal deficits  Psych: Normal mood and affect   ASSESSMENT:   Kathryn Brown is a 44 y.o. female who presents for the following: 1. Bradycardia   2. Palpitations   3. Chest pain, unspecified type   4. Dizziness     PLAN:   1. Bradycardia 2. Palpitations 3. Chest pain, unspecified type 4. Dizziness -Symptoms started before getting metoprolol.  She was started on Prozac recently.  This can be a side effect of Prozac.  I would like her to stop this.  She is only on 10 mg and has not been on it long enough to need to taper. -Her EKG demonstrates sinus bradycardia, heart rate 49 with no acute ST-T changes or evidence of prior infarction.  There is no evidence of high-grade conduction disease.  This  does not explain her symptoms.  Her blood pressure is 102/62.  This is not an issue for her today. -Symptoms could also be related to dehydration.  She will double the amount of water she drinks. -She continues to have atypical chest pain.  Her EKG is without acute ischemic changes.  I highly suspect this is anxiety and stress.  I have asked her to take the weekend off and relax. -I also want her to double the amount of water she is drinking.  I suspect dehydration is an issue as well.  With a combination of new medications and dehydration I think this explains was going on. -We will try to repeat her CT scan early next week.  She does not need metoprolol.  Her heart rate is in the 40-50 range.  We will plan to give her fluids and give nitro that day. -Her cholesterol shows elevated HDL cholesterol which is good.  74.  Her LDL cholesterol is 144.  We will follow-up the CTA and echocardiogram in a few weeks.  Disposition: Return if symptoms worsen or fail to improve.  Medication Adjustments/Labs and Tests Ordered: Current medicines are reviewed at length with the patient today.  Concerns regarding medicines are outlined above.  Orders Placed This Encounter  Procedures  . CT CORONARY MORPH W/CTA COR W/SCORE W/CA W/CM &/OR WO/CM  . CT CORONARY FRACTIONAL FLOW RESERVE DATA PREP  . CT CORONARY FRACTIONAL FLOW RESERVE FLUID ANALYSIS  . EKG 12-Lead   No orders of the defined types were placed in this encounter.   Patient Instructions  Medication Instructions:   DO NOT TAKE METOPROLOL before CT.  *If you need a refill on  your cardiac medications before your next appointment, please call your pharmacy*   Testing/Procedures: Your physician has requested that you have cardiac CT. Cardiac computed tomography (CT) is a painless test that uses an x-ray machine to take clear, detailed pictures of your heart. For further information please visit [email or web address].[email or web address]. Please follow instruction sheet as  given.   Follow-Up: At [facility], you and your health needs are our priority.  As part of our continuing mission to provide you with exceptional heart care, we have created designated Provider Care Teams.  These Care Teams include your primary Cardiologist (physician) and Advanced Practice Providers (APPs -  Physician Assistants and Nurse Practitioners) who all work together to provide you with the care you need, when you need it.  We recommend signing up for the patient portal called "MyChart".  Sign up information is provided on this After Visit Summary.  MyChart is used to connect with patients for Virtual Visits (Telemedicine).  Patients are able to view lab/test results, encounter notes, upcoming appointments, etc.  Non-urgent messages can be sent to your provider as well.   To learn more about what you can do with MyChart, go to [email or web address].    Your next appointment:   As scheduled  The format for your next appointment:   In Person  Provider:   Eleonore Chiquito, MD   Other Instructions Your cardiac CT will be scheduled at one of the below locations:   [facility] [address] [place], [place] [address] [phone]  If scheduled at [facility], please arrive at the [facility] main entrance of [facility] 30 minutes prior to test start time. Proceed to the [facility] Radiology Department (first floor) to check-in and test prep.  Please follow these instructions carefully (unless otherwise directed):  On the Night Before the Test: . Be sure to Drink plenty of water. . Do not consume any caffeinated/decaffeinated beverages or chocolate 12 hours prior to your test. . Do not take any antihistamines 12 hours prior to your test.  On the Day of the Test: . Drink plenty of water. Do not drink any water within one hour of the test. . Do not eat any food 4 hours prior to the test. . You may take your regular medications prior to the  test.  . HOLD Furosemide/Hydrochlorothiazide morning of the test. . FEMALES- please wear underwire-free bra if available       After the Test: . Drink plenty of water. . After receiving IV contrast, you may experience a mild flushed feeling. This is normal. . On occasion, you may experience a mild rash up to 24 hours after the test. This is not dangerous. If this occurs, you can take Benadryl 25 mg and increase your fluid intake. . If you experience trouble breathing, this can be serious. If it is severe call 911 IMMEDIATELY. If it is mild, please call our office. . If you take any of these medications: Glipizide/Metformin, Avandament, Glucavance, please do not take 48 hours after completing test unless otherwise instructed.   Once we have confirmed authorization from your insurance company, we will call you to set up a date and time for your test. Based on how quickly your insurance processes prior authorizations requests, please allow up to 4 weeks to be contacted for scheduling your Cardiac CT appointment. Be advised that routine Cardiac CT appointments could be scheduled as many as 8 weeks after your provider has ordered it.  For non-scheduling related  questions, please contact the cardiac imaging nurse navigator should you have any questions/concerns: Marchia Bond, Cardiac Imaging Nurse Navigator Burley Saver, Interim Cardiac Imaging Nurse Navigator [facility] Direct Office Dial: [phone]   For scheduling needs, including cancellations and rescheduling, please call Vivien Rota at [phone], option 3.        Time Spent with Patient: I have spent a total of 25 minutes with patient reviewing hospital notes, telemetry, EKGs, labs and examining the patient as well as establishing an assessment and plan that was discussed with the patient.  > 50% of time was spent in direct patient care.  Signed, Addison Naegeli. Audie Box, [place]  [address], [place], [place] [address] [phone]  08/30/2019 9:56 AM

## 2019-08-29 NOTE — Telephone Encounter (Signed)
Spoke to pt who report since taking metoprolol dose for CT scan, her HR is still running low. She report HR is averaging between 40-50 and she is feeling lightheaded and very tired. MD made aware and advised to schedule an appointment. Appointment scheduled for tomorrow 7/2.

## 2019-08-30 ENCOUNTER — Telehealth (HOSPITAL_COMMUNITY): Payer: Self-pay | Admitting: *Deleted

## 2019-08-30 ENCOUNTER — Encounter: Payer: Self-pay | Admitting: Cardiovascular Disease

## 2019-08-30 ENCOUNTER — Other Ambulatory Visit: Payer: Self-pay

## 2019-08-30 ENCOUNTER — Ambulatory Visit: Payer: 59 | Admitting: Cardiovascular Disease

## 2019-08-30 VITALS — BP 102/62 | HR 49 | Temp 97.7°F | Ht 69.0 in | Wt 151.2 lb

## 2019-08-30 DIAGNOSIS — R001 Bradycardia, unspecified: Secondary | ICD-10-CM | POA: Diagnosis not present

## 2019-08-30 DIAGNOSIS — R079 Chest pain, unspecified: Secondary | ICD-10-CM

## 2019-08-30 DIAGNOSIS — R002 Palpitations: Secondary | ICD-10-CM

## 2019-08-30 DIAGNOSIS — R42 Dizziness and giddiness: Secondary | ICD-10-CM | POA: Diagnosis not present

## 2019-08-30 NOTE — Patient Instructions (Signed)
Medication Instructions:   DO NOT TAKE METOPROLOL before CT.  *If you need a refill on your cardiac medications before your next appointment, please call your pharmacy*   Testing/Procedures: Your physician has requested that you have cardiac CT. Cardiac computed tomography (CT) is a painless test that uses an x-ray machine to take clear, detailed pictures of your heart. For further information please visit [email or web address]. Please follow instruction sheet as given.   Follow-Up: At [facility], you and your health needs are our priority.  As part of our continuing mission to provide you with exceptional heart care, we have created designated Provider Care Teams.  These Care Teams include your primary Cardiologist (physician) and Advanced Practice Providers (APPs -  Physician Assistants and Nurse Practitioners) who all work together to provide you with the care you need, when you need it.  We recommend signing up for the patient portal called "MyChart".  Sign up information is provided on this After Visit Summary.  MyChart is used to connect with patients for Virtual Visits (Telemedicine).  Patients are able to view lab/test results, encounter notes, upcoming appointments, etc.  Non-urgent messages can be sent to your provider as well.   To learn more about what you can do with MyChart, go to [email or web address].    Your next appointment:   As scheduled  The format for your next appointment:   In Person  Provider:   Eleonore Chiquito, MD   Other Instructions Your cardiac CT will be scheduled at one of the below locations:   [facility] [address] [place], [place] [address] [phone]  If scheduled at [facility], please arrive at the [facility] main entrance of [facility] 30 minutes prior to test start time. Proceed to the [facility] Radiology Department (first floor) to check-in and test prep.  Please follow these instructions carefully  (unless otherwise directed):  On the Night Before the Test: . Be sure to Drink plenty of water. . Do not consume any caffeinated/decaffeinated beverages or chocolate 12 hours prior to your test. . Do not take any antihistamines 12 hours prior to your test.  On the Day of the Test: . Drink plenty of water. Do not drink any water within one hour of the test. . Do not eat any food 4 hours prior to the test. . You may take your regular medications prior to the test.  . HOLD Furosemide/Hydrochlorothiazide morning of the test. . FEMALES- please wear underwire-free bra if available       After the Test: . Drink plenty of water. . After receiving IV contrast, you may experience a mild flushed feeling. This is normal. . On occasion, you may experience a mild rash up to 24 hours after the test. This is not dangerous. If this occurs, you can take Benadryl 25 mg and increase your fluid intake. . If you experience trouble breathing, this can be serious. If it is severe call 911 IMMEDIATELY. If it is mild, please call our office. . If you take any of these medications: Glipizide/Metformin, Avandament, Glucavance, please do not take 48 hours after completing test unless otherwise instructed.   Once we have confirmed authorization from your insurance company, we will call you to set up a date and time for your test. Based on how quickly your insurance processes prior authorizations requests, please allow up to 4 weeks to be contacted for scheduling your Cardiac CT appointment. Be advised that routine Cardiac CT appointments could be  scheduled as many as 8 weeks after your provider has ordered it.  For non-scheduling related questions, please contact the cardiac imaging nurse navigator should you have any questions/concerns: Marchia Bond, Cardiac Imaging Nurse Navigator Burley Saver, Interim Cardiac Imaging Nurse [place] [facility] Direct Office Dial: [phone]   For  scheduling needs, including cancellations and rescheduling, please call Vivien Rota at [phone], option 3.

## 2019-08-30 NOTE — Telephone Encounter (Signed)
Reaching out to patient to offer assistance regarding upcoming cardiac imaging study; pt verbalizes understanding of appt date/time, parking situation and where to check in, pre-test NPO status.  Name and call back number provided for further questions should they arise  Burley Saver RN Navigator Cardiac [place] [facility] [phone] office [phone] cell

## 2019-09-03 ENCOUNTER — Ambulatory Visit (HOSPITAL_COMMUNITY)
Admission: RE | Admit: 2019-09-03 | Discharge: 2019-09-03 | Disposition: A | Discharge status: Home / Self Care | Payer: 59 | Source: Ambulatory Visit | Attending: Cardiovascular Disease | Admitting: Cardiovascular Disease

## 2019-09-03 ENCOUNTER — Other Ambulatory Visit: Payer: Self-pay

## 2019-09-03 ENCOUNTER — Telehealth: Payer: Self-pay | Admitting: Cardiovascular Disease

## 2019-09-03 DIAGNOSIS — R079 Chest pain, unspecified: Secondary | ICD-10-CM

## 2019-09-03 MED ORDER — SODIUM CHLORIDE 0.9 % IV BOLUS
500.0000 mL | Freq: Once | INTRAVENOUS | Status: AC
  Administered 2019-09-03: 500 mL via INTRAVENOUS

## 2019-09-03 MED ORDER — NITROGLYCERIN 0.4 MG SL SUBL
0.8000 mg | SUBLINGUAL_TABLET | Freq: Once | SUBLINGUAL | Status: AC
  Administered 2019-09-03: 0.8 mg via SUBLINGUAL

## 2019-09-03 MED ORDER — IOHEXOL 350 MG/ML SOLN
80.0000 mL | Freq: Once | INTRAVENOUS | Status: AC | PRN
  Administered 2019-09-03: 80 mL via INTRAVENOUS

## 2019-09-03 MED ORDER — NITROGLYCERIN 0.4 MG SL SUBL
SUBLINGUAL_TABLET | SUBLINGUAL | Status: AC
  Filled 2019-09-03: qty 1

## 2019-09-03 NOTE — Progress Notes (Signed)
Dr Scharlene Gloss notified of pts bp 89/62 new orders received.

## 2019-09-03 NOTE — Telephone Encounter (Signed)
Notified Mrs. Kathryn Brown of normal cardiac CTA. Normal heart arteries. Chest pain is non-cardiac and likely stress related. Will proceed with echo and zio patch as discussed in office.   Gerri Spore T. Flora Lipps, MD [facility]  [address] [place], [place] [address] [phone]  11:27 AM

## 2019-09-04 ENCOUNTER — Telehealth: Payer: Self-pay | Admitting: Cardiovascular Disease

## 2019-09-04 NOTE — Telephone Encounter (Signed)
Patient calling to confirm that Dr. Flora Lipps does want her to continue with the stress echo even though she had the CT yesterday. She also would like to discuss more about the hole in her heart that was discovered on the CT.

## 2019-09-04 NOTE — Telephone Encounter (Signed)
Called patient, discussed no need for the stress echo- cancelled this appointment.  Answered questions regarding the CT results.  Patient verbalized understanding.

## 2019-09-12 ENCOUNTER — Other Ambulatory Visit: Payer: Self-pay

## 2019-09-12 ENCOUNTER — Ambulatory Visit (HOSPITAL_COMMUNITY): Payer: 59 | Attending: Internal Medicine

## 2019-09-12 ENCOUNTER — Other Ambulatory Visit (HOSPITAL_COMMUNITY): Payer: 59

## 2019-09-12 DIAGNOSIS — R079 Chest pain, unspecified: Secondary | ICD-10-CM | POA: Diagnosis not present

## 2019-09-12 LAB — ECHOCARDIOGRAM COMPLETE
Area-P 1/2: 3.48 cm2
S' Lateral: 3 cm

## 2019-10-10 ENCOUNTER — Other Ambulatory Visit (HOSPITAL_COMMUNITY): Payer: 59

## 2019-11-12 NOTE — Progress Notes (Signed)
Cardiology Office Note:   Date:  11/13/2019  NAME:  Kathryn Brown    MRN: [id number] DOB:  [birth year]   PCP:  Janeece Agee, NP  Cardiologist:  No primary care provider on file.   Referring MD: No ref. provider found   Chief Complaint  Patient presents with  . Follow-up   History of Present Illness:   Kathryn Brown is a 44 y.o. female with a hx of PFO who presents for follow-up. She was seen several months ago for chest pain and underwent CCTA that was normal. Noted to have small PFO on CT and confirmed on echo. Echo normal.   She reports she is doing well.  She still has intermittent chest tightness and palpitations.  Seem to be stress related.  She is still working in a high stress position.  She works since Neurosurgeon.  Coronavirus has been tough in her field.  We did go over the results of her testing which were normal.  She appears to be in good overall cardiovascular health.  She has no evidence of mitral valve prolapse.  She denies any chest pain, shortness of breath or palpitations in office today.  Past Medical History: Past Medical History:  Diagnosis Date  . Chest pain   . Heart murmur   . Heart palpitations   . History of stomach ulcers    as a child  . MVP (mitral valve prolapse) 10/30/2011    Past Surgical History: Past Surgical History:  Procedure Laterality Date  . TONSILECTOMY, ADENOIDECTOMY, BILATERAL MYRINGOTOMY AND TUBES    . TONSILLECTOMY      Current Medications: Current Meds  Medication Sig  . FLUoxetine (PROZAC) 10 MG capsule Take 1 capsule (10 mg total) by mouth daily.  Marland Kitchen gabapentin (NEURONTIN) 100 MG capsule Take 1 capsule (100 mg total) by mouth at bedtime as needed.  . hydrOXYzine (ATARAX/VISTARIL) 25 MG tablet Take 0.5-1 tablets (12.5-25 mg total) by mouth every 8 (eight) hours as needed for anxiety.  . metoprolol tartrate (LOPRESSOR) 100 MG tablet Take 1 tablet by mouth once for procedure.  . Vitamin D, Ergocalciferol, (DRISDOL)  1.25 MG (50000 UNIT) CAPS capsule Take 1 capsule (50,000 Units total) by mouth every 7 (seven) days.  . [DISCONTINUED] FLUoxetine (PROZAC) 10 MG tablet Take 1 tablet (10 mg total) by mouth daily.     Allergies:    Sulfa antibiotics   Social History: Social History   Socioeconomic History  . Marital status: Single    Spouse name: Not on file  . Number of children: Not on file  . Years of education: Not on file  . Highest education level: Not on file  Occupational History  . Not on file  Tobacco Use  . Smoking status: Former Smoker    Packs/day: 0.50    Years: 10.00    Pack years: 5.00    Types: Cigarettes  . Smokeless tobacco: Never Used  Substance and Sexual Activity  . Alcohol use: Yes    Alcohol/week: 1.0 standard drink    Types: 1 Glasses of wine per week  . Drug use: No  . Sexual activity: Not on file  Other Topics Concern  . Not on file  Social History Narrative  . Not on file   Social Determinants of Health   Financial Resource Strain:   . Difficulty of Paying Living Expenses: Not on file  Food Insecurity:   . Worried About Programme researcher, broadcasting/film/video in the Last Year: Not on file  .  Ran Out of Food in the Last Year: Not on file  Transportation Needs:   . Lack of Transportation (Medical): Not on file  . Lack of Transportation (Non-Medical): Not on file  Physical Activity:   . Days of Exercise per Week: Not on file  . Minutes of Exercise per Session: Not on file  Stress:   . Feeling of Stress : Not on file  Social Connections:   . Frequency of Communication with Friends and Family: Not on file  . Frequency of Social Gatherings with Friends and Family: Not on file  . Attends Religious Services: Not on file  . Active Member of Clubs or Organizations: Not on file  . Attends Banker Meetings: Not on file  . Marital Status: Not on file     Family History: The patient's family history includes Diabetes in her mother; Heart disease in her father and  mother; Hypertension in her father and mother.  ROS:   All other ROS reviewed and negative. Pertinent positives noted in the HPI.     EKGs/Labs/Other Studies Reviewed:   The following studies were personally reviewed by me today:  TTE 09/12/2019 1. Left ventricular ejection fraction, by estimation, is 55 to 60%. The  left ventricle has normal function. The left ventricle has no regional  wall motion abnormalities. Left ventricular diastolic parameters were  normal.  2. Right ventricular systolic function is normal. The right ventricular  size is normal. There is normal pulmonary artery systolic pressure. The  estimated right ventricular systolic pressure is 15.4 mmHg.  3. The mitral valve is normal. Trivial mitral valve regurgitation. No  prolapse.  4. The aortic valve is tricuspid. Aortic valve regurgitation is not  visualized.  5. Evidence of atrial level shunting detected by color flow Doppler.  There is a small patent foramen ovale with predominantly left to right  shunting across the atrial septum.   CCTA 09/03/2019 IMPRESSION: 1. Coronary calcium score of 0.  2. Normal coronary origin with right dominance.  3. Normal coronary arteries.  4. Small PFO.  Recent Labs: 08/19/2019: BUN 12; Creatinine, Ser 0.82; Hemoglobin 12.8; Platelets 259; Potassium 3.9; Sodium 140; TSH 0.705 08/23/2019: Magnesium 2.0   Recent Lipid Panel    Component Value Date/Time   CHOL 237 (H) 08/19/2019 1446   TRIG 112 08/19/2019 1446   HDL 74 08/19/2019 1446   CHOLHDL 3.2 08/19/2019 1446   LDLCALC 144 (H) 08/19/2019 1446    Physical Exam:   VS:  BP 94/60   Pulse 96   Ht 5\' 8"  (1.727 m)   Wt 152 lb (68.9 kg)   BMI 23.11 kg/m    Wt Readings from Last 3 Encounters:  11/13/19 152 lb (68.9 kg)  08/30/19 151 lb 3.2 oz (68.6 kg)  08/23/19 149 lb 12.8 oz (67.9 kg)    General: Well nourished, well developed, in no acute distress Heart: Atraumatic, normal size  Eyes: PEERLA, EOMI    Neck: Supple, no JVD Endocrine: No thryomegaly Cardiac: Normal S1, S2; RRR; no murmurs, rubs, or gallops Lungs: Clear to auscultation bilaterally, no wheezing, rhonchi or rales  Abd: Soft, nontender, no hepatomegaly  Ext: No edema, pulses 2+ Musculoskeletal: No deformities, BUE and BLE strength normal and equal Skin: Warm and dry, no rashes   Neuro: Alert and oriented to person, place, time, and situation, CNII-XII grossly intact, no focal deficits  Psych: Normal mood and affect   ASSESSMENT:   Kathryn Brown is a 44 y.o. female who  presents for the following: 1. PFO (patent foramen ovale)     PLAN:   1. PFO (patent foramen ovale) -PFO found incidentally for work-up of chest pain.  She had a negative coronary CTA.  Normal echo.  No mitral valve prolapse.  We did go over the fact that nearly 25% of the population has a PFO.  There is no need to do any further intervention on this.  This is not associated with any reduced survival.  We will see her as needed.  Disposition: Return if symptoms worsen or fail to improve.  Medication Adjustments/Labs and Tests Ordered: Current medicines are reviewed at length with the patient today.  Concerns regarding medicines are outlined above.  No orders of the defined types were placed in this encounter.  No orders of the defined types were placed in this encounter.   Patient Instructions  Medication Instructions:  The current medical regimen is effective;  continue present plan and medications.  *If you need a refill on your cardiac medications before your next appointment, please call your pharmacy*  Follow-Up: At [facility], you and your health needs are our priority.  As part of our continuing mission to provide you with exceptional heart care, we have created designated Provider Care Teams.  These Care Teams include your primary Cardiologist (physician) and Advanced Practice Providers (APPs -  Physician Assistants and Nurse  Practitioners) who all work together to provide you with the care you need, when you need it.  We recommend signing up for the patient portal called "MyChart".  Sign up information is provided on this After Visit Summary.  MyChart is used to connect with patients for Virtual Visits (Telemedicine).  Patients are able to view lab/test results, encounter notes, upcoming appointments, etc.  Non-urgent messages can be sent to your provider as well.   To learn more about what you can do with MyChart, go to [email or web address].    Your next appointment:   As needed  The format for your next appointment:   In Person  Provider:   Lennie Odor, MD         Time Spent with Patient: I have spent a total of 25 minutes with patient reviewing hospital notes, telemetry, EKGs, labs and examining the patient as well as establishing an assessment and plan that was discussed with the patient.  > 50% of time was spent in direct patient care.  Signed, Lenna Gilford. Flora Lipps, MD [facility]  [address] [place], [place] [address] [phone]  11/13/2019 4:32 PM

## 2019-11-13 ENCOUNTER — Other Ambulatory Visit: Payer: Self-pay

## 2019-11-13 ENCOUNTER — Ambulatory Visit (INDEPENDENT_AMBULATORY_CARE_PROVIDER_SITE_OTHER): Payer: 59 | Admitting: Cardiovascular Disease

## 2019-11-13 ENCOUNTER — Encounter: Payer: Self-pay | Admitting: Cardiovascular Disease

## 2019-11-13 VITALS — BP 94/60 | HR 96 | Ht 68.0 in | Wt 152.0 lb

## 2019-11-13 DIAGNOSIS — Q2112 Patent foramen ovale: Secondary | ICD-10-CM

## 2019-11-13 DIAGNOSIS — Q211 Atrial septal defect: Secondary | ICD-10-CM

## 2019-11-13 NOTE — Patient Instructions (Signed)
Medication Instructions:  The current medical regimen is effective;  continue present plan and medications.  *If you need a refill on your cardiac medications before your next appointment, please call your pharmacy*    Follow-Up: At [facility], you and your health needs are our priority.  As part of our continuing mission to provide you with exceptional heart care, we have created designated Provider Care Teams.  These Care Teams include your primary Cardiologist (physician) and Advanced Practice Providers (APPs -  Physician Assistants and Nurse Practitioners) who all work together to provide you with the care you need, when you need it.  We recommend signing up for the patient portal called "MyChart".  Sign up information is provided on this After Visit Summary.  MyChart is used to connect with patients for Virtual Visits (Telemedicine).  Patients are able to view lab/test results, encounter notes, upcoming appointments, etc.  Non-urgent messages can be sent to your provider as well.   To learn more about what you can do with MyChart, go to [email or web address].    Your next appointment:   As needed  The format for your next appointment:   In Person  Provider:   Wirt O'Neal, MD      

## 2020-05-19 DIAGNOSIS — F411 Generalized anxiety disorder: Secondary | ICD-10-CM | POA: Diagnosis not present

## 2020-05-19 DIAGNOSIS — Z1331 Encounter for screening for depression: Secondary | ICD-10-CM | POA: Diagnosis not present

## 2020-05-19 DIAGNOSIS — F4312 Post-traumatic stress disorder, chronic: Secondary | ICD-10-CM | POA: Diagnosis not present

## 2020-05-26 DIAGNOSIS — F4312 Post-traumatic stress disorder, chronic: Secondary | ICD-10-CM | POA: Diagnosis not present

## 2020-05-26 DIAGNOSIS — F411 Generalized anxiety disorder: Secondary | ICD-10-CM | POA: Diagnosis not present

## 2020-06-02 DIAGNOSIS — F411 Generalized anxiety disorder: Secondary | ICD-10-CM | POA: Diagnosis not present

## 2020-06-02 DIAGNOSIS — F4312 Post-traumatic stress disorder, chronic: Secondary | ICD-10-CM | POA: Diagnosis not present

## 2020-06-23 DIAGNOSIS — F4312 Post-traumatic stress disorder, chronic: Secondary | ICD-10-CM | POA: Diagnosis not present

## 2020-06-23 DIAGNOSIS — F411 Generalized anxiety disorder: Secondary | ICD-10-CM | POA: Diagnosis not present

## 2020-06-30 DIAGNOSIS — F4320 Adjustment disorder, unspecified: Secondary | ICD-10-CM | POA: Diagnosis not present

## 2020-07-08 DIAGNOSIS — F4320 Adjustment disorder, unspecified: Secondary | ICD-10-CM | POA: Diagnosis not present

## 2020-07-15 DIAGNOSIS — F4320 Adjustment disorder, unspecified: Secondary | ICD-10-CM | POA: Diagnosis not present

## 2020-07-22 DIAGNOSIS — F4320 Adjustment disorder, unspecified: Secondary | ICD-10-CM | POA: Diagnosis not present

## 2020-07-28 DIAGNOSIS — F4320 Adjustment disorder, unspecified: Secondary | ICD-10-CM | POA: Diagnosis not present

## 2020-08-11 DIAGNOSIS — F4320 Adjustment disorder, unspecified: Secondary | ICD-10-CM | POA: Diagnosis not present

## 2020-08-20 DIAGNOSIS — F4320 Adjustment disorder, unspecified: Secondary | ICD-10-CM | POA: Diagnosis not present

## 2020-09-08 DIAGNOSIS — F4320 Adjustment disorder, unspecified: Secondary | ICD-10-CM | POA: Diagnosis not present

## 2020-09-15 DIAGNOSIS — F4320 Adjustment disorder, unspecified: Secondary | ICD-10-CM | POA: Diagnosis not present

## 2020-09-22 DIAGNOSIS — F4320 Adjustment disorder, unspecified: Secondary | ICD-10-CM | POA: Diagnosis not present

## 2020-10-06 DIAGNOSIS — F4320 Adjustment disorder, unspecified: Secondary | ICD-10-CM | POA: Diagnosis not present

## 2020-10-13 ENCOUNTER — Telehealth: Payer: 59 | Admitting: Family Medicine

## 2020-10-13 DIAGNOSIS — J0140 Acute pansinusitis, unspecified: Secondary | ICD-10-CM

## 2020-10-13 DIAGNOSIS — F4320 Adjustment disorder, unspecified: Secondary | ICD-10-CM | POA: Diagnosis not present

## 2020-10-13 DIAGNOSIS — R058 Other specified cough: Secondary | ICD-10-CM

## 2020-10-13 MED ORDER — AZITHROMYCIN 250 MG PO TABS: ORAL_TABLET | ORAL | 0 refills | Status: AC

## 2020-10-13 MED ORDER — PROMETHAZINE-DM 6.25-15 MG/5ML PO SYRP: 2.5000 mL | ORAL_SOLUTION | Freq: Two times a day (BID) | ORAL | 0 refills | Status: DC | PRN

## 2020-10-13 MED ORDER — ALBUTEROL SULFATE HFA 108 (90 BASE) MCG/ACT IN AERS: 2.0000 | INHALATION_SPRAY | Freq: Four times a day (QID) | RESPIRATORY_TRACT | 0 refills | Status: DC | PRN

## 2020-10-13 NOTE — Patient Instructions (Signed)
I appreciate the opportunity to provide you with care for your health and wellness. I hope you feel better soon!  Please continue to practice social distancing to keep you, your family, and our community safe.  If you must go out, please wear a mask and practice good handwashing.  Have a wonderful day. With [employer], Tereasa Coop, DNP, AGNP-BC  Sinusitis, Adult Sinusitis is inflammation of your sinuses. Sinuses are hollow spaces in the bones around your face. Your sinuses are located: Around your eyes. In the middle of your forehead. Behind your nose. In your cheekbones. Mucus normally drains out of your sinuses. When your nasal tissues become inflamed or swollen, mucus can become trapped or blocked. This allows bacteria, viruses, and fungi to grow, which leads to infection. Most infections of thesinuses are caused by a virus. Sinusitis can develop quickly. It can last for up to 4 weeks (acute) or for more than 12 weeks (chronic). Sinusitis often develops after a cold. What are the causes? This condition is caused by anything that creates swelling in the sinuses or stops mucus from draining. This includes: Allergies. Asthma. Infection from bacteria or viruses. Deformities or blockages in your nose or sinuses. Abnormal growths in the nose (nasal polyps). Pollutants, such as chemicals or irritants in the air. Infection from fungi (rare). What increases the risk? You are more likely to develop this condition if you: Have a weak body defense system (immune system). Do a lot of swimming or diving. Overuse nasal sprays. Smoke. What are the signs or symptoms? The main symptoms of this condition are pain and a feeling of pressure around the affected sinuses. Other symptoms include: Stuffy nose or congestion. Thick drainage from your nose. Swelling and warmth over the affected sinuses. Headache. Upper toothache. A cough that may get worse at night. Extra mucus that collects in the  throat or the back of the nose (postnasal drip). Decreased sense of smell and taste. Fatigue. A fever. Sore throat. Bad breath. How is this diagnosed? This condition is diagnosed based on: Your symptoms. Your medical history. A physical exam. Tests to find out if your condition is acute or chronic. This may include: Checking your nose for nasal polyps. Viewing your sinuses using a device that has a light (endoscope). Testing for allergies or bacteria. Imaging tests, such as an MRI or CT scan. In rare cases, a bone biopsy may be done to rule out more serious types offungal sinus disease. How is this treated? Treatment for sinusitis depends on the cause and whether your condition is chronic or acute. If caused by a virus, your symptoms should go away on their own within 10 days. You may be given medicines to relieve symptoms. They include: Medicines that shrink swollen nasal passages (topical intranasal decongestants). Medicines that treat allergies (antihistamines). A spray that eases inflammation of the nostrils (topical intranasal corticosteroids). Rinses that help get rid of thick mucus in your nose (nasal saline washes). If caused by bacteria, your health care provider may recommend waiting to see if your symptoms improve. Most bacterial infections will get better without antibiotic medicine. You may be given antibiotics if you have: A severe infection. A weak immune system. If caused by narrow nasal passages or nasal polyps, you may need to have surgery. Follow these instructions at home: Medicines Take, use, or apply over-the-counter and prescription medicines only as told by your health care provider. These may include nasal sprays. If you were prescribed an antibiotic medicine, take it as told by  your health care provider. Do not stop taking the antibiotic even if you start to feel better. Hydrate and humidify  Drink enough fluid to keep your urine pale yellow. Staying  hydrated will help to thin your mucus. Use a cool mist humidifier to keep the humidity level in your home above 50%. Inhale steam for 10-15 minutes, 3-4 times a day, or as told by your health care provider. You can do this in the bathroom while a hot shower is running. Limit your exposure to cool or dry air.  Rest Rest as much as possible. Sleep with your head raised (elevated). Make sure you get enough sleep each night. General instructions  Apply a warm, moist washcloth to your face 3-4 times a day or as told by your health care provider. This will help with discomfort. Wash your hands often with soap and water to reduce your exposure to germs. If soap and water are not available, use hand sanitizer. Do not smoke. Avoid being around people who are smoking (secondhand smoke). Keep all follow-up visits as told by your health care provider. This is important.  Contact a health care provider if: You have a fever. Your symptoms get worse. Your symptoms do not improve within 10 days. Get help right away if: You have a severe headache. You have persistent vomiting. You have severe pain or swelling around your face or eyes. You have vision problems. You develop confusion. Your neck is stiff. You have trouble breathing. Summary Sinusitis is soreness and inflammation of your sinuses. Sinuses are hollow spaces in the bones around your face. This condition is caused by nasal tissues that become inflamed or swollen. The swelling traps or blocks the flow of mucus. This allows bacteria, viruses, and fungi to grow, which leads to infection. If you were prescribed an antibiotic medicine, take it as told by your health care provider. Do not stop taking the antibiotic even if you start to feel better. Keep all follow-up visits as told by your health care provider. This is important. This information is not intended to replace advice given to you by your health care provider. Make sure you discuss any  questions you have with your healthcare provider. Document Revised: 07/17/2017 Document Reviewed: 07/17/2017 Elsevier Patient Education  2022 [employer].  Promethazine; Dextromethorphan Oral Solution What is this medication? PROMETHAZINE; DEXTROMETHORPHAN (proe METH a zeen; dex troe meth OR fan) is a cough suppressant and an antihistamine. It is used to treat coughing due tocolds or allergies. This medicine will not treat an infection. This medicine may be used for other purposes; ask your health care provider orpharmacist if you have questions. COMMON BRAND NAME(S): Phen Tuss DM What should I tell my care team before I take this medication? They need to know if you have any of the following conditions: blockage in your bowel diabetes eczema glaucoma heart disease liver disease low blood counts, like low white cell, platelet, or red cell counts lung or breathing disease, like asthma Parkinson's disease prostate disease seizures stomach or intestine problems trouble passing urine an unusual or allergic reaction to promethazine, dextromethorphan, other medicines, foods, dyes, or preservatives pregnant or trying to get pregnant breast-feeding How should I use this medication? Take this medicine by mouth with a glass of water. Follow the directions on the prescription label. Use a specially marked spoon or container to measure your medicine. Household spoons are not accurate. Take your doses at regularintervals. Do not take your medicine more often than directed. Talk to  your pediatrician regarding the use of this medicine in children. Special care may be needed. Do not use this medicine in children less than 642years of age. Patients over 47 years old may have a stronger reaction and need a smaller dose. Overdosage: If you think you have taken too much of this medicine contact apoison control center or emergency room at once. NOTE: This medicine is only for you. Do not share this  medicine with others. What if I miss a dose? If you miss a dose, take it as soon as you can. If it is almost time for yournext dose, take only that dose. Do not take double or extra doses. What may interact with this medication? Do not take this medicine with any of the following medications: MAOIs like Carbex, Eldepryl, Marplan, Nardil, and Parnate This medicine may also interact with the following medications: alcohol or alcohol-containing products antihistamines for allergy, cough, and cold atropine certain medicines for anxiety or sleep certain medicines for bladder problems like oxybutynin, tolterodine certain medicines for depression like amitriptyline, fluoxetine, sertraline certain medicines for Parkinson's disease like benztropine, trihexyphenidyl certain medicines for stomach problems like dicyclomine, hyoscyamine certain medicines for travel sickness like scopolamine epinephrine general anesthetics like halothane, isoflurane, methoxyflurane, propofol ipratropium medicines for depression, anxiety or psychotic disturbances medicines for high blood pressure medicines for seizures like phenobarbital, primidone, phenytoin medicines that relax muscles for surgery metoclopramide narcotic medicines for pain This list may not describe all possible interactions. Give your health care provider a list of all the medicines, herbs, non-prescription drugs, or dietary supplements you use. Also tell them if you smoke, drink alcohol, or use illegaldrugs. Some items may interact with your medicine. What should I watch for while using this medication? Tell your doctor if your symptoms do not improve or if they get worse. You may get drowsy or dizzy. Do not drive, use machinery, or do anything that needs mental alertness until you know how this medicine affects you. Do not stand or sit up quickly, especially if you are an older patient. This reduces the risk of dizzy or fainting spells. Alcohol may  interfere with the effect ofthis medicine. Avoid alcoholic drinks. This medicine can make you more sensitive to the sun. Keep out of the sun. If you cannot avoid being in the sun, wear protective clothing and use sunscreen.Do not use sun lamps or tanning beds/booths. What side effects may I notice from receiving this medication? Side effects that you should report to your doctor or health care professionalas soon as possible: allergic reactions like skin rash, itching or hives, swelling of the face, lips, or tongue changes in vision confusion fever, chills, sore throat restlessness seizures signs and symptoms of high blood sugar such as being more thirsty or hungry or having to urinate more than normal. You may also feel very tired or have blurry vision. signs and symptoms of liver injury like dark yellow or brown urine; general ill feeling or flu-like symptoms; light-colored stools; loss of appetite; nausea; right upper belly pain; unusually weak or tired; yellowing of the eyes or skin signs and symptoms of low blood pressure like dizziness; feeling faint or lightheaded, falls; unusually weak or tired trouble passing urine or change in the amount of urine trouble swallowing uncontrollable movements of the arms, face, head, mouth, neck, or upper body unusual bruising or bleeding unusually weak or tired Side effects that usually do not require medical attention (report to yourdoctor or health care professional if they continue or are  bothersome): drowsiness dizziness dry mouth nausea upset stomach This list may not describe all possible side effects. Call your doctor for medical advice about side effects. You may report side effects to FDA at1-800-FDA-1088. Where should I keep my medication? Keep out of the reach of children. Store at room temperature between 20 and 25 degrees C (68 and 77 degrees F).Protect from light. Throw away any unused medicine after the expiration date. NOTE: This  sheet is a summary. It may not cover all possible information. If you have questions about this medicine, talk to your doctor, pharmacist, orhealth care provider.  2022 Elsevier/Gold Standard (2019-01-03 15:13:18)  Azithromycin Tablets What is this medication? AZITHROMYCIN (az ith roe MYE sin) treats infections caused by bacteria. It belongs to a group of medications called antibiotics. It will not treat colds,the flu, or infections caused by viruses. This medicine may be used for other purposes; ask your health care provider orpharmacist if you have questions. COMMON BRAND NAME(S): Zithromax, Zithromax Tri-Pak, Zithromax Z-Pak What should I tell my care team before I take this medication? They need to know if you have any of these conditions: History of blood diseases, like leukemia History of irregular heartbeat Kidney disease Liver disease Myasthenia gravis An unusual or allergic reaction to azithromycin, erythromycin, other macrolide antibiotics, foods, dyes, or preservatives Pregnant or trying to get pregnant Breast-feeding How should I use this medication? Take this medication by mouth with a full glass of water. Follow the directions on the prescription label. The tablets can be taken with food or on an empty stomach. If the medication upsets your stomach, take it with food. Take your medication at regular intervals. Do not take your medication more often than directed. Take all of your medication as directed even if you think you arebetter. Do not skip doses or stop your medication early. Talk to your care team regarding the use of this medication in children. While this medication may be prescribed for children as young as 6 months forselected conditions, precautions do apply. Overdosage: If you think you have taken too much of this medicine contact apoison control center or emergency room at once. NOTE: This medicine is only for you. Do not share this medicine with others. What if I  miss a dose? If you miss a dose, take it as soon as you can. If it is almost time for yournext dose, take only that dose. Do not take double or extra doses. What may interact with this medication? Do not take this medication with any of the following: Cisapride Dronedarone Pimozide Thioridazine This medication may also interact with the following: Antacids that contain aluminum or magnesium Birth control pills Colchicine Cyclosporine Digoxin Ergot alkaloids like dihydroergotamine, ergotamine Nelfinavir Other medications that prolong the QT interval (an abnormal heart rhythm) Phenytoin Warfarin This list may not describe all possible interactions. Give your health care provider a list of all the medicines, herbs, non-prescription drugs, or dietary supplements you use. Also tell them if you smoke, drink alcohol, or use illegaldrugs. Some items may interact with your medicine. What should I watch for while using this medication? Tell your care team if your symptoms do not start to get better or if they getworse. This medication may cause serious skin reactions. They can happen weeks to months after starting the medication. Contact your care team right away if you notice fevers or flu-like symptoms with a rash. The rash may be red or purple and then turn into blisters or peeling of the skin.  Or, you might notice a red rash with swelling of the face, lips or lymph nodes in your neck or under yourarms. Do not treat diarrhea with over the counter products. Contact your care team ifyou have diarrhea that lasts more than 2 days or if it is severe and watery. This medication can make you more sensitive to the sun. Keep out of the sun. If you cannot avoid being in the sun, wear protective clothing and use sunscreen.Do not use sun lamps or tanning beds/booths. What side effects may I notice from receiving this medication? Side effects that you should report to your care team as soon as  possible: Allergic reactions or angioedema-skin rash, itching, hives, swelling of the face, eyes, lips, tongue, arms, or legs, trouble swallowing or breathing Heart rhythm changes-fast or irregular heartbeat, dizziness, feeling faint or lightheaded, chest pain, trouble breathing Liver injury-right upper belly pain, loss of appetite, nausea, light-colored stool, dark yellow or brown urine, yellowing skin or eyes, unusual weakness or fatigue Rash, fever, and swollen lymph nodes Redness, blistering, peeling, or loosening of the skin, including inside the mouth Severe diarrhea, fever Unusual vaginal discharge, itching, or odor Side effects that usually do not require medical attention (report to your careteam if they continue or are bothersome): Diarrhea Nausea Stomach pain Vomiting This list may not describe all possible side effects. Call your doctor for medical advice about side effects. You may report side effects to FDA at1-800-FDA-1088. Where should I keep my medication? Keep out of the reach of children and pets. Store at room temperature between 15 and 30 degrees C (59 and 86 degrees F).Throw away any unused medication after the expiration date. NOTE: This sheet is a summary. It may not cover all possible information. If you have questions about this medicine, talk to your doctor, pharmacist, orhealth care provider.  2022 Elsevier/Gold Standard (2020-01-08 11:19:31)

## 2020-10-13 NOTE — Progress Notes (Signed)
Ms. Kathryn Brown, wacha are scheduled for a virtual visit with your provider today.    Just as we do with appointments in the office, we must obtain your consent to participate.  Your consent will be active for this visit and any virtual visit you may have with one of our providers in the next 365 days.    If you have a MyChart account, I can also send a copy of this consent to you electronically.  All virtual visits are billed to your insurance company just like a traditional visit in the office.  As this is a virtual visit, video technology does not allow for your provider to perform a traditional examination.  This may limit your provider's ability to fully assess your condition.  If your provider identifies any concerns that need to be evaluated in person or the need to arrange testing such as labs, EKG, etc, we will make arrangements to do so.    Although advances in technology are sophisticated, we cannot ensure that it will always work on either your end or our end.  If the connection with a video visit is poor, we may have to switch to a telephone visit.  With either a video or telephone visit, we are not always able to ensure that we have a secure connection.   I need to obtain your verbal consent now.   Are you willing to proceed with your visit today?   Kathryn Brown has provided verbal consent on 10/13/2020 for a virtual visit (video or telephone).   Freddy Finner, NP 10/13/2020  1:19 PM   Date:  10/13/2020   ID:  [name], DOB 09/21/1975, MRN [id number]  Patient Location: Home Provider Location: Home Office   Participants: Patient and Provider for Visit and Wrap up  Method of visit: Video  Location of Patient: Home Location of Provider: Home Office Consent was obtain for visit over the video. Services rendered by provider: Visit was performed via video  A video enabled telemedicine application was used and I verified that I am speaking with the correct person using two  identifiers.  PCP:  Janeece Agee, NP   Chief Complaint:  cold symptoms   History of Present Illness:    Kathryn Brown is a 45 y.o. female with history as stated below. Presents video telehealth for an acute care visit due to cold symptoms. Onset of symptoms was last Thursday and symptoms have been persistent and include: congestion, sore throat, fevers -max 99.6, trouble breathing-shortness of breath with rest and sitting, chest pain due to coughing so much. Coughing all the time-dry and wet. Mucus is clear.       Denies having ear pain, or exposure to covid. Granddaughter had sinus infection, and coworker had covid.   Modifying factors include: nothing as of yet No other aggravating or relieving factors.  No other c/o.  COVID test neg   Past Medical, Surgical, Social History, Allergies, and Medications have been Reviewed.  Past Medical History:  Diagnosis Date   Chest pain    Heart murmur    Heart palpitations    History of stomach ulcers    as a child   MVP (mitral valve prolapse) 10/30/2011    No outpatient medications have been marked as taking for the 10/13/20 encounter (Appointment) with [facility] PROVIDER.     Allergies:   Sulfa antibiotics   ROS See HPI for history of present illness.  Physical Exam Constitutional:      Appearance:  Normal appearance.  HENT:     Head: Normocephalic.     Right Ear: External ear normal.     Left Ear: External ear normal.     Nose: Nose normal.  Eyes:     Conjunctiva/sclera: Conjunctivae normal.  Pulmonary:     Effort: Pulmonary effort is normal.     Comments: Limited sound- no cough visualized during visit  Musculoskeletal:        General: Normal range of motion.     Cervical back: Normal range of motion.  Skin:    Coloration: Skin is not jaundiced.  Neurological:     General: No focal deficit present.     Mental Status: She is alert.              A&P  1. Acute non-recurrent pansinusitis S& are consistent  with sinus infection Zpak and OTC measures reviewed  Reviewed side effects, risks and benefits of medication.   Patient acknowledged agreement and understanding of the plan.   Some exposure risk- might of had a false neg COVID test    - azithromycin (ZITHROMAX) 250 MG tablet; Take 2 tablets on day 1, then 1 tablet daily on days 2 through 5  Dispense: 6 tablet; Refill: 0  2. Upper airway cough syndrome Hx of asthma when younger, hx of cough syndrome, having chest tightness and cough Inhaler and prom Dm provided   Reviewed side effects, risks and benefits of medication.   Patient acknowledged agreement and understanding of the plan.     - promethazine-dextromethorphan (PROMETHAZINE-DM) 6.25-15 MG/5ML syrup; Take 2.5 mLs by mouth 2 (two) times daily as needed for cough.  Dispense: 118 mL; Refill: 0 - albuterol (VENTOLIN HFA) 108 (90 Base) MCG/ACT inhaler; Inhale 2 puffs into the lungs every 6 (six) hours as needed for wheezing or shortness of breath.  Dispense: 8 g; Refill: 0    I discussed the assessment and treatment plan with the patient. The patient was provided an opportunity to ask questions and all were answered. The patient agreed with the plan and demonstrated an understanding of the instructions.   The patient was advised to call back or seek an in-person evaluation if the symptoms worsen or if the condition fails to improve as anticipated.   The above assessment and management plan was discussed with the patient. The patient verbalized understanding of and has agreed to the management plan. Patient is aware to call the clinic if symptoms persist or worsen. Patient is aware when to return to the clinic for a follow-up visit. Patient educated on when it is appropriate to go to the emergency department.   Time:   Today, I have spent 15 minutes with the patient with telehealth technology discussing the above problems, reviewing the chart, previous notes, medications and orders.    Medication Changes: No orders of the defined types were placed in this encounter.  Disposition:  Follow up PRN Signed, Freddy Finner, NP  10/13/2020 1:19 PM

## 2020-10-20 DIAGNOSIS — F4320 Adjustment disorder, unspecified: Secondary | ICD-10-CM | POA: Diagnosis not present

## 2020-10-27 DIAGNOSIS — F4320 Adjustment disorder, unspecified: Secondary | ICD-10-CM | POA: Diagnosis not present

## 2020-11-10 DIAGNOSIS — F4320 Adjustment disorder, unspecified: Secondary | ICD-10-CM | POA: Diagnosis not present

## 2020-11-17 DIAGNOSIS — F4320 Adjustment disorder, unspecified: Secondary | ICD-10-CM | POA: Diagnosis not present

## 2020-12-08 DIAGNOSIS — F4320 Adjustment disorder, unspecified: Secondary | ICD-10-CM | POA: Diagnosis not present

## 2020-12-10 ENCOUNTER — Encounter: Payer: Self-pay | Admitting: Registered Nurse

## 2020-12-10 ENCOUNTER — Telehealth: Payer: Self-pay | Admitting: Physician Assistant

## 2020-12-10 ENCOUNTER — Other Ambulatory Visit: Payer: Self-pay | Admitting: Registered Nurse

## 2020-12-10 DIAGNOSIS — B889 Infestation, unspecified: Secondary | ICD-10-CM

## 2020-12-10 DIAGNOSIS — G2581 Restless legs syndrome: Secondary | ICD-10-CM

## 2020-12-10 MED ORDER — GABAPENTIN 100 MG PO CAPS: 100.0000 mg | ORAL_CAPSULE | Freq: Every evening | ORAL | 0 refills | Status: DC | PRN

## 2020-12-10 MED ORDER — HYDROXYZINE PAMOATE 25 MG PO CAPS: 25.0000 mg | ORAL_CAPSULE | Freq: Three times a day (TID) | ORAL | 0 refills | Status: DC | PRN

## 2020-12-10 MED ORDER — TRIAMCINOLONE ACETONIDE 0.1 % EX CREA
1.0000 | TOPICAL_CREAM | Freq: Two times a day (BID) | CUTANEOUS | 0 refills | Status: DC
Start: 2020-12-10 — End: 2021-06-29

## 2020-12-10 MED ORDER — PERMETHRIN 5 % EX CREA: 1.0000 "application " | TOPICAL_CREAM | Freq: Once | CUTANEOUS | 0 refills | Status: AC

## 2020-12-10 NOTE — Progress Notes (Signed)
Virtual Visit Consent   Kathryn Brown, you are scheduled for a virtual visit with a [facility] provider today.     Just as with appointments in the office, your consent must be obtained to participate.  Your consent will be active for this visit and any virtual visit you may have with one of our providers in the next 365 days.     If you have a MyChart account, a copy of this consent can be sent to you electronically.  All virtual visits are billed to your insurance company just like a traditional visit in the office.    As this is a virtual visit, video technology does not allow for your provider to perform a traditional examination.  This may limit your provider's ability to fully assess your condition.  If your provider identifies any concerns that need to be evaluated in person or the need to arrange testing (such as labs, EKG, etc.), we will make arrangements to do so.     Although advances in technology are sophisticated, we cannot ensure that it will always work on either your end or our end.  If the connection with a video visit is poor, the visit may have to be switched to a telephone visit.  With either a video or telephone visit, we are not always able to ensure that we have a secure connection.     I need to obtain your verbal consent now.   Are you willing to proceed with your visit today?    Kathryn Brown has provided verbal consent on 12/10/2020 for a virtual visit (video or telephone).   [name], [place]   Date: 12/10/2020 8:06 AM   Virtual Visit via Video Note   I, [name], connected with  Kathryn Brown  ([id number], Nov 25, 1975) on 12/10/20 at  8:00 AM EDT by a video-enabled telemedicine application and verified that I am speaking with the correct person using two identifiers.  Location: Patient: Virtual Visit Location Patient: Home Provider: Virtual Visit Location Provider: Home Office   I discussed the limitations of evaluation and management by  telemedicine and the availability of in person appointments. The patient expressed understanding and agreed to proceed.    History of Present Illness: Kathryn Brown is a 45 y.o. who identifies as a female who was assigned female at birth, and is being seen today for bites of arms and legs noted this past Tuesday on the way back from the beach. Notes bites are very pruritic and has noted some new areas in the past day. Notes some isolated lesions of anterior neck but not noting any on her torso or pelvic region. Denies fevers, chills. Husband asymptomatic.  HPI: HPI  Problems:  Patient Active Problem List   Diagnosis Date Noted   Vitamin D deficiency 08/26/2019   Upper airway cough syndrome 08/06/2017   DOE (dyspnea on exertion) 08/04/2017   MVP (mitral valve prolapse) 10/30/2011    Allergies:  Allergies  Allergen Reactions   Sulfa Antibiotics Anaphylaxis   Medications:  Current Outpatient Medications:    hydrOXYzine (VISTARIL) 25 MG capsule, Take 1 capsule (25 mg total) by mouth every 8 (eight) hours as needed., Disp: 30 capsule, Rfl: 0   permethrin (ELIMITE) 5 % cream, Apply 1 application topically once for 1 dose., Disp: 60 g, Rfl: 0   triamcinolone cream (KENALOG) 0.1 %, Apply 1 application topically 2 (two) times daily., Disp: 30 g, Rfl: 0   gabapentin (NEURONTIN) 100  MG capsule, Take 1 capsule (100 mg total) by mouth at bedtime as needed., Disp: 30 capsule, Rfl: 3  Observations/Objective: Patient is well-developed, well-nourished in no acute distress.  Resting comfortably at home.  Head is normocephalic, atraumatic.  No labored breathing. Speech is clear and coherent with logical content.  Patient is alert and oriented at baseline.  Multiple isolates "bites" of anterior and posterior bilateral upper and lower extremities noted with mild swelling and erythema. No tracking noted. No blistering lesions noted.   Assessment and Plan: 1. Mite infestation - permethrin (ELIMITE) 5 %  cream; Apply 1 application topically once for 1 dose.  Dispense: 60 g; Refill: 0 - hydrOXYzine (VISTARIL) 25 MG capsule; Take 1 capsule (25 mg total) by mouth every 8 (eight) hours as needed.  Dispense: 30 capsule; Refill: 0 Mite/sand flea most likely giving appearance of the areas and her recent beach trip. Start Permethrin x 1 to help eradicate any remaining mites, et con the skin. Home cleaning recommendations reviewed. Hydroxyzine and triamcinolone per orders. Supportive measures reviewed. Follow-up in person if not resolving, anything worsens or new symptoms develop.   Follow Up Instructions: I discussed the assessment and treatment plan with the patient. The patient was provided an opportunity to ask questions and all were answered. The patient agreed with the plan and demonstrated an understanding of the instructions.  A copy of instructions were sent to the patient via MyChart unless otherwise noted below.   The patient was advised to call back or seek an in-person evaluation if the symptoms worsen or if the condition fails to improve as anticipated.  Time:  I spent 10 minutes with the patient via telehealth technology discussing the above problems/concerns.    [name], PA-C

## 2020-12-10 NOTE — Patient Instructions (Signed)
  Kathryn Brown, thank you for joining [name], PA-C for today's virtual visit.  While this provider is not your primary care provider (PCP), if your PCP is located in our provider database this encounter information will be shared with them immediately following your visit.  Consent: (Patient) Kathryn Brown provided verbal consent for this virtual visit at the beginning of the encounter.  Current Medications:  Current Outpatient Medications:    albuterol (VENTOLIN HFA) 108 (90 Base) MCG/ACT inhaler, Inhale 2 puffs into the lungs every 6 (six) hours as needed for wheezing or shortness of breath., Disp: 8 g, Rfl: 0   FLUoxetine (PROZAC) 10 MG capsule, Take 1 capsule (10 mg total) by mouth daily., Disp: 90 capsule, Rfl: 1   gabapentin (NEURONTIN) 100 MG capsule, Take 1 capsule (100 mg total) by mouth at bedtime as needed., Disp: 30 capsule, Rfl: 3   hydrOXYzine (ATARAX/VISTARIL) 25 MG tablet, Take 0.5-1 tablets (12.5-25 mg total) by mouth every 8 (eight) hours as needed for anxiety., Disp: 30 tablet, Rfl: 0   metoprolol tartrate (LOPRESSOR) 100 MG tablet, Take 1 tablet by mouth once for procedure., Disp: 1 tablet, Rfl: 0   promethazine-dextromethorphan (PROMETHAZINE-DM) 6.25-15 MG/5ML syrup, Take 2.5 mLs by mouth 2 (two) times daily as needed for cough., Disp: 118 mL, Rfl: 0   Vitamin D, Ergocalciferol, (DRISDOL) 1.25 MG (50000 UNIT) CAPS capsule, Take 1 capsule (50,000 Units total) by mouth every 7 (seven) days., Disp: 8 capsule, Rfl: 0   Medications ordered in this encounter:  No orders of the defined types were placed in this encounter.    *If you need refills on other medications prior to your next appointment, please contact your pharmacy*  Follow-Up: Call back or seek an in-person evaluation if the symptoms worsen or if the condition fails to improve as anticipated.  Other Instructions Please keep skin clean and dry. Apply the Permethrin cream at night x 1, washing off the  next morning as directed. You can use the hydroxyzine up to three times daily for itch. The Triamcinolone (steroid) cream will help with local itch and most troublesome bites. You can also use OTC Sarna lotion.  Clean linens as directed.  If symptoms are not improving/resolving or if you note new or worsening symptoms, please seek in-person evaluation.  Here is the contact information for your PCP's new office: [facility] [address] [place], [place] [address] [phone]    If you have been instructed to have an in-person evaluation today at a local Urgent Care facility, please use the link below. It will take you to a list of all of our available [facility], including address, phone number and hours of operation. Please do not delay care.  [facility]  If you or a family member do not have a primary care provider, use the link below to schedule a visit and establish care. When you choose a [facility] primary care physician or advanced practice provider, you gain a long-term partner in health. Find a Primary Care Provider  Learn more about [facility]'s in-office and virtual care options: [facility] - Get Care Now

## 2020-12-10 NOTE — Addendum Note (Signed)
Addended by: [name] on: 12/10/2020 12:23 PM   Modules accepted: Orders

## 2020-12-10 NOTE — Telephone Encounter (Signed)
Called this patient to let her know where we are now located and also scheduled patient an appointment for 12/29/2020 at 2:50 pm

## 2020-12-15 DIAGNOSIS — F4320 Adjustment disorder, unspecified: Secondary | ICD-10-CM | POA: Diagnosis not present

## 2020-12-22 DIAGNOSIS — F4320 Adjustment disorder, unspecified: Secondary | ICD-10-CM | POA: Diagnosis not present

## 2020-12-29 ENCOUNTER — Ambulatory Visit: Payer: Self-pay | Admitting: Registered Nurse

## 2020-12-29 DIAGNOSIS — F4320 Adjustment disorder, unspecified: Secondary | ICD-10-CM | POA: Diagnosis not present

## 2021-01-12 DIAGNOSIS — F4320 Adjustment disorder, unspecified: Secondary | ICD-10-CM | POA: Diagnosis not present

## 2021-01-19 DIAGNOSIS — F4320 Adjustment disorder, unspecified: Secondary | ICD-10-CM | POA: Diagnosis not present

## 2021-01-26 DIAGNOSIS — F4320 Adjustment disorder, unspecified: Secondary | ICD-10-CM | POA: Diagnosis not present

## 2021-02-03 DIAGNOSIS — F4320 Adjustment disorder, unspecified: Secondary | ICD-10-CM | POA: Diagnosis not present

## 2021-02-09 DIAGNOSIS — F4320 Adjustment disorder, unspecified: Secondary | ICD-10-CM | POA: Diagnosis not present

## 2021-02-16 DIAGNOSIS — F4320 Adjustment disorder, unspecified: Secondary | ICD-10-CM | POA: Diagnosis not present

## 2021-02-23 DIAGNOSIS — F4320 Adjustment disorder, unspecified: Secondary | ICD-10-CM | POA: Diagnosis not present

## 2021-03-02 DIAGNOSIS — F4320 Adjustment disorder, unspecified: Secondary | ICD-10-CM | POA: Diagnosis not present

## 2021-03-09 ENCOUNTER — Emergency Department (HOSPITAL_COMMUNITY)
Admission: EM | Admit: 2021-03-09 | Discharge: 2021-03-09 | Disposition: A | Discharge status: Home / Self Care | Payer: BC Managed Care – PPO | Attending: Emergency Medicine | Admitting: Emergency Medicine

## 2021-03-09 ENCOUNTER — Telehealth: Payer: Self-pay | Admitting: Family Medicine

## 2021-03-09 ENCOUNTER — Ambulatory Visit (HOSPITAL_COMMUNITY)
Admission: EM | Admit: 2021-03-09 | Discharge: 2021-03-09 | Disposition: A | Discharge status: Home / Self Care | Payer: BC Managed Care – PPO | Attending: Internal Medicine | Admitting: Internal Medicine

## 2021-03-09 ENCOUNTER — Other Ambulatory Visit: Payer: Self-pay

## 2021-03-09 ENCOUNTER — Emergency Department (HOSPITAL_COMMUNITY): Payer: BC Managed Care – PPO

## 2021-03-09 ENCOUNTER — Encounter (HOSPITAL_COMMUNITY): Payer: Self-pay | Admitting: Emergency Medicine

## 2021-03-09 DIAGNOSIS — R42 Dizziness and giddiness: Secondary | ICD-10-CM | POA: Diagnosis not present

## 2021-03-09 DIAGNOSIS — M545 Low back pain, unspecified: Secondary | ICD-10-CM | POA: Diagnosis not present

## 2021-03-09 DIAGNOSIS — Z7982 Long term (current) use of aspirin: Secondary | ICD-10-CM | POA: Insufficient documentation

## 2021-03-09 DIAGNOSIS — H538 Other visual disturbances: Secondary | ICD-10-CM | POA: Diagnosis not present

## 2021-03-09 DIAGNOSIS — R079 Chest pain, unspecified: Secondary | ICD-10-CM | POA: Insufficient documentation

## 2021-03-09 DIAGNOSIS — R110 Nausea: Secondary | ICD-10-CM | POA: Insufficient documentation

## 2021-03-09 DIAGNOSIS — M549 Dorsalgia, unspecified: Secondary | ICD-10-CM | POA: Insufficient documentation

## 2021-03-09 DIAGNOSIS — R52 Pain, unspecified: Secondary | ICD-10-CM

## 2021-03-09 DIAGNOSIS — R0602 Shortness of breath: Secondary | ICD-10-CM

## 2021-03-09 LAB — BASIC METABOLIC PANEL
Anion gap: 13 (ref 5–15)
BUN: 7 mg/dL (ref 6–20)
CO2: 21 mmol/L — ABNORMAL LOW (ref 22–32)
Calcium: 9 mg/dL (ref 8.9–10.3)
Chloride: 104 mmol/L (ref 98–111)
Creatinine, Ser: 0.95 mg/dL (ref 0.44–1.00)
GFR, Estimated: >60 mL/min (ref >60)
Glucose, Bld: 99 mg/dL (ref 70–99)
Potassium: 4 mmol/L (ref 3.5–5.1)
Sodium: 138 mmol/L (ref 135–145)

## 2021-03-09 LAB — CBC
HCT: 39.9 % (ref 36.0–46.0)
Hemoglobin: 13.1 g/dL (ref 12.0–15.0)
MCH: 30.1 pg (ref 26.0–34.0)
MCHC: 32.8 g/dL (ref 30.0–36.0)
MCV: 91.7 fL (ref 80.0–100.0)
Platelets: 266 10*3/uL (ref 150–400)
RBC: 4.35 MIL/uL (ref 3.87–5.11)
RDW: 12.8 % (ref 11.5–15.5)
WBC: 7.4 10*3/uL (ref 4.0–10.5)
nRBC: 0 % (ref 0.0–0.2)

## 2021-03-09 LAB — TROPONIN I (HIGH SENSITIVITY)
Troponin I (High Sensitivity): 3 ng/L (ref <18)
Troponin I (High Sensitivity): <2 ng/L (ref <18)

## 2021-03-09 NOTE — ED Triage Notes (Signed)
Chest pain started this morning.  When she woke this morning, symptoms started and progressed.  Patient has had dizziness, hard heart beats, fuzziness, confusion.    Patient had a telemed visit.  Provider instructed her to go to the ED.  Patient says she cannot remember.  Patient complains of sob, chest tightness, complains of clamminess and dizziness.

## 2021-03-09 NOTE — ED Triage Notes (Signed)
Pt. Stated, I was told to come here from [facility] for SOB with chest pain and the same in my back. Started around 0600. I had the same about a month ago.

## 2021-03-09 NOTE — ED Notes (Signed)
Patient is being discharged from the Urgent Care and sent to the Emergency Department via pov . Per adrienne white, np, patient is in need of higher level of care due to limited resources at ucc to evaluate cardiac pain/pcp instructed ED for evaluation. Patient is aware and verbalizes understanding of plan of care.  Vitals:   03/09/21 0847  BP: 116/82  Pulse: 78  Resp: (!) 28  Temp: 98.9 F (37.2 C)  SpO2: 99%

## 2021-03-09 NOTE — ED Provider Notes (Signed)
[facility]    CSN: [id number] Arrival date & time: 03/09/21  [id number]      History   Chief Complaint Chief Complaint  Patient presents with   Chest Pain    HPI MIKKI ZIFF is a 46 y.o. female.   Patient presents with dizziness, shortness of breath, nausea, blurred vision described as tunnel vision, sensation of fuzziness and centralized chest pressure beginning this morning approximately around 6 AM.  He states that chest pain has been constant Remaining symptoms have been occurring intermittently.  Possibly related to mitral valve per patient, symptoms have occurred before , last episode approximately 1 month ago.  Endorses that she did take dose of aspirin.  Endorses presence of mitral valve prolapse.  Familial history of myocardial infarction.  Past Medical History:  Diagnosis Date   Chest pain    Heart murmur    Heart palpitations    History of stomach ulcers    as a child   MVP (mitral valve prolapse) 10/30/2011    Patient Active Problem List   Diagnosis Date Noted   Vitamin D deficiency 08/26/2019   Upper airway cough syndrome 08/06/2017   DOE (dyspnea on exertion) 08/04/2017   MVP (mitral valve prolapse) 10/30/2011    Past Surgical History:  Procedure Laterality Date   TONSILECTOMY, ADENOIDECTOMY, BILATERAL MYRINGOTOMY AND TUBES     TONSILLECTOMY      OB History   No obstetric history on file.      Home Medications    Prior to Admission medications   Medication Sig Start Date End Date Taking? Authorizing Provider  gabapentin (NEURONTIN) 100 MG capsule Take 1 capsule (100 mg total) by mouth at bedtime as needed. Patient taking differently: Take 100 mg by mouth at bedtime as needed. Not taking as instructed 12/10/20  Yes Burnette, Clearnce Sorrel, PA-C  hydrOXYzine (VISTARIL) 25 MG capsule Take 1 capsule (25 mg total) by mouth every 8 (eight) hours as needed. 12/10/20   Brunetta Jeans, PA-C  triamcinolone cream (KENALOG) 0.1 % Apply 1 application  topically 2 (two) times daily. 12/10/20   Brunetta Jeans, PA-C    Family History Family History  Problem Relation Age of Onset   Heart disease Mother    Hypertension Mother    Diabetes Mother    Heart disease Father    Hypertension Father     Social History Social History   Tobacco Use   Smoking status: Former    Packs/day: 0.50    Years: 10.00    Pack years: 5.00    Types: Cigarettes   Smokeless tobacco: Never  Vaping Use   Vaping Use: Some days  Substance Use Topics   Alcohol use: Yes    Alcohol/week: 1.0 standard drink    Types: 1 Glasses of wine per week   Drug use: No     Allergies   Sulfa antibiotics   Review of Systems Review of Systems   Physical Exam Triage Vital Signs ED Triage Vitals  Enc Vitals Group     BP 03/09/21 0847 116/82     Pulse Rate 03/09/21 0847 78     Resp 03/09/21 0847 (!) 28     Temp 03/09/21 0847 98.9 F (37.2 C)     Temp Source 03/09/21 0847 Oral     SpO2 03/09/21 0847 99 %     Weight --      Height --      Head Circumference --  Flow --   °   Pain Score 03/09/21 0845 10  °   Pain Loc --   °   Pain Edu? --   °   Excl. in GC? --   ° °No data found. ° °Updated Vital Signs °BP 116/82 (BP Location: Right Arm)    Pulse 78    Temp 98.9 °F (37.2 °C) (Oral)    Resp (!) 28    SpO2 99%  ° °Visual Acuity °Right Eye Distance:   °Left Eye Distance:   °Bilateral Distance:   ° °Right Eye Near:   °Left Eye Near:    °Bilateral Near:    ° °Physical Exam ° ° °UC Treatments / Results  °Labs °(all labs ordered are listed, but only abnormal results are displayed) °Labs Reviewed - No data to display ° °EKG ° ° °Radiology °No results found. ° °Procedures °Procedures (including critical care time) ° °Medications Ordered in UC °Medications - No data to display ° °Initial Impression / Assessment and Plan / UC Course  °I have reviewed the triage vital signs and the nursing notes. ° °Pertinent labs & imaging results that were available during my care  of the patient were reviewed by me and considered in my medical decision making (see chart for details). ° °Chest pain, unspecified °Blurred vision °Dizziness °Shortness of breath ° °Patient sent to emergency department for evaluation off myriad of symptoms with specific concerns to centralized chest pressure, EKG in urgent care and normal sinus rhythm with regular pace, vital signs are stable, will escort self to nearest emergency department °Final Clinical Impressions(s) / UC Diagnoses  ° °Final diagnoses:  °None  ° °Discharge Instructions   °None °  ° °ED Prescriptions   °None °  ° °PDMP not reviewed this encounter. °  °,  R, NP °03/09/21 0959 ° °

## 2021-03-09 NOTE — Progress Notes (Signed)
[place]  She woke up this morning after sitting up she got lightheaded/dizzy, could not catch her breath, seemed to not be able to concentrate, had a hard to moving around. Developed chest pain after driving into work. Reports a BP of 104/70 .  Advised ED due to active chest pain.  HX of MVP as well   Patient acknowledged agreement and understanding of the plan.

## 2021-03-09 NOTE — ED Provider Notes (Signed)
[facility] EMERGENCY DEPARTMENT Provider Note   CSN: [id number] Arrival date & time: 03/09/21  [id number]     History  Chief Complaint  Patient presents with   Back Pain   Nausea   Shortness of Breath    Kathryn Brown is a 46 y.o. female.  HPI Patient presents with her husband assists with the history. She presents due to an episode of chest pain, dyspnea, nausea. This is resolved, but occurred today, and 2 weeks ago.  No obvious precipitant, there was discomfort, anterior, tightness, without syncope, vomiting. No recent illness, no recent medication change, she does not smoke, does not drink. History notable for mitral valve prolapse.     Home Medications Prior to Admission medications   Medication Sig Start Date End Date Taking? Authorizing Provider  aspirin EC 81 MG tablet Take 81 mg by mouth daily. Swallow whole.   Yes [provider]  gabapentin (NEURONTIN) 100 MG capsule Take 1 capsule (100 mg total) by mouth at bedtime as needed. Patient taking differently: Take 50 mg by mouth at bedtime as needed (leg shakes). 12/10/20  Yes Mar Daring, PA-C  hydrOXYzine (VISTARIL) 25 MG capsule Take 1 capsule (25 mg total) by mouth every 8 (eight) hours as needed. Patient not taking: Reported on 03/09/2021 12/10/20   Brunetta Jeans, PA-C  triamcinolone cream (KENALOG) 0.1 % Apply 1 application topically 2 (two) times daily. Patient not taking: Reported on 03/09/2021 12/10/20   Brunetta Jeans, PA-C      Allergies    Sulfa antibiotics    Review of Systems   Review of Systems  Constitutional:        Per HPI, otherwise negative  HENT:         Per HPI, otherwise negative  Respiratory:         Per HPI, otherwise negative  Cardiovascular:        Per HPI, otherwise negative  Gastrointestinal:  Negative for vomiting.  Endocrine:       Negative aside from HPI  Genitourinary:        Neg aside from HPI   Musculoskeletal:        Per HPI,  otherwise negative  Skin: Negative.   Neurological:  Negative for syncope.   Physical Exam Updated Vital Signs BP 106/66 (BP Location: Left Arm)    Pulse 73    Temp 99 F (37.2 C) (Oral)    Resp 20    SpO2 100%  Physical Exam Vitals and nursing note reviewed.  Constitutional:      General: She is not in acute distress.    Appearance: She is well-developed.  HENT:     Head: Normocephalic and atraumatic.  Eyes:     Conjunctiva/sclera: Conjunctivae normal.  Cardiovascular:     Rate and Rhythm: Normal rate and regular rhythm.  Pulmonary:     Effort: Pulmonary effort is normal. No respiratory distress.     Breath sounds: Normal breath sounds. No stridor.  Abdominal:     General: There is no distension.  Skin:    General: Skin is warm and dry.  Neurological:     Mental Status: She is alert and oriented to person, place, and time.     Cranial Nerves: No cranial nerve deficit.    ED Results / Procedures / Treatments   Labs (all labs ordered are listed, but only abnormal results are displayed) Labs Reviewed  BASIC METABOLIC PANEL - Abnormal; Notable for the following components:  Result Value   CO2 21 (*)    All other components within normal limits  CBC  TROPONIN I (HIGH SENSITIVITY)  TROPONIN I (HIGH SENSITIVITY)    EKG EKG Interpretation  Date/Time:  Tuesday March 09 2021 09:29:40 EST Ventricular Rate:  75 PR Interval:  156 QRS Duration: 78 QT Interval:  372 QTC Calculation: 415 R Axis:   77 Text Interpretation: Normal sinus rhythm Normal ECG When compared with ECG of 09-Mar-2021 08:56, PREVIOUS ECG IS PRESENT Confirmed by Carmin Muskrat [phone]) on 03/09/2021 2:02:24 PM  Radiology DG Chest 2 View  Result Date: 03/09/2021 CLINICAL DATA:  Chest pain EXAM: CHEST - 2 VIEW COMPARISON:  08/04/2017 FINDINGS: The cardiac silhouette, mediastinal and hilar contours are within normal limits. The lungs are clear. No pleural effusions. No pulmonary lesions. The bony  thorax is intact. IMPRESSION: No acute cardiopulmonary findings. Electronically Signed   By: Marijo Sanes M.D.   On: 03/09/2021 10:05    Procedures Procedures    Medications Ordered in ED Medications - No data to display  ED Course/ Medical Decision Making/ A&P  3:49 PM Patient calm, in no distress, on cardiac monitor rate 70 sinus normal Pulse ox 100% room air normal I reviewed her chart, notable for echocardiogram performed about 18 months ago notable for shunt, AV.  I also reviewed the patient's CT scanning cardiac perfusion, last year, reassuring, no evidence for CAD.  Today's x-rays were reviewed, interpreted by myself, unremarkable.  EKG nonischemic, 2 normal troponin, low suspicion for atypical ACS given the very low risk profile.  We discussed other possibilities, bacteremia, sepsis, occult infection, all of which are generally unlikely.  Patient comfortable with discharge, states that she will follow-up with her outpatient clinicians.  Final Clinical Impression(s) / ED Diagnoses Final diagnoses:  Pain    Rx / DC Orders ED Discharge Orders     None         Carmin Muskrat, MD 03/09/21 1551

## 2021-03-09 NOTE — Discharge Instructions (Signed)
As discussed, your evaluation today has been largely reassuring.  But, it is important that you monitor your condition carefully, and do not hesitate to return to the ED if you develop new, or concerning changes in your condition. ? ?Otherwise, please follow-up with your physician for appropriate ongoing care. ? ?

## 2021-03-09 NOTE — ED Notes (Signed)
Pt left prior to being able to go over D/C paperwork and obtain VS

## 2021-03-16 DIAGNOSIS — F4320 Adjustment disorder, unspecified: Secondary | ICD-10-CM | POA: Diagnosis not present

## 2021-03-23 DIAGNOSIS — F4320 Adjustment disorder, unspecified: Secondary | ICD-10-CM | POA: Diagnosis not present

## 2021-03-25 ENCOUNTER — Ambulatory Visit (INDEPENDENT_AMBULATORY_CARE_PROVIDER_SITE_OTHER): Payer: BC Managed Care – PPO | Admitting: Cardiovascular Disease

## 2021-03-25 ENCOUNTER — Other Ambulatory Visit: Payer: Self-pay

## 2021-03-25 ENCOUNTER — Encounter: Payer: Self-pay | Admitting: Cardiovascular Disease

## 2021-03-25 VITALS — BP 110/73 | HR 89 | Ht 68.0 in | Wt 166.2 lb

## 2021-03-25 DIAGNOSIS — R072 Precordial pain: Secondary | ICD-10-CM

## 2021-03-25 MED ORDER — SERTRALINE HCL 25 MG PO TABS: 25.0000 mg | ORAL_TABLET | Freq: Every day | ORAL | 1 refills | Status: DC

## 2021-03-25 NOTE — Progress Notes (Signed)
Cardiology Office Note:   Date:  03/25/2021  NAME:  Kathryn Brown    MRN: [id number] DOB:  [birth year]   PCP:  Janeece Agee, NP  Cardiologist:  None  Electrophysiologist:  None   Referring MD: Janeece Agee, NP   Chief Complaint  Patient presents with   Shortness of Breath    TWO WEEKS AGO IN ED, HAD SOB, CHEST PAIN, CHEST TIGHTNESS, EKG WAS NORMAL.   History of Present Illness:   Kathryn Brown is a 46 y.o. female with a hx of PFO who presents for follow-up. Seen in ER 03/09/2021 for noncardiac CP. Normal CCTA.  She reports a sensation of nausea for abdominal pain.  Also felt dizzy and lightheaded.  She went to the ER she also had some chest tightness.  Work-up there was unremarkable.  She reports she is stressed.  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  Work is quite stressful.  Her husband is also a patient who has a history of CAD with stenting.  He is experiencing more chest pain symptoms and this bothers her.  She reports he does not feel stressed but does have a lot on her plate.  She is working all the time.  She has difficulties letting her drive go.  Apparently she does not have a assistant who can help her.  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  She reports she gets up to 10,000 steps daily.  Routinely she does not have any chest pain or trouble breathing.  She is worried about a prior diagnosis of mitral valve prolapse.  We discussed her most recent echo which shows no evidence of this.  Her heart is very normal.  We discussed that I believe she is likely depressed.  I believe most of her symptoms are coming from this.  She has no cardiac reason for this.  We discussed exercise and proper diet.  She does miss meals a lot.  I suspect this is a big issue for her.  Cardiovascular examination is normal.  EKG in ER was normal.  Problem List PFO  Past Medical History: Past Medical History:  Diagnosis Date   Chest pain    Heart murmur    Heart palpitations    History of stomach  ulcers    as a child   MVP (mitral valve prolapse) 10/30/2011    Past Surgical History: Past Surgical History:  Procedure Laterality Date   TONSILECTOMY, ADENOIDECTOMY, BILATERAL MYRINGOTOMY AND TUBES     TONSILLECTOMY      Current Medications: Current Meds  Medication Sig   aspirin EC 81 MG tablet Take 81 mg by mouth daily. Swallow whole.   gabapentin (NEURONTIN) 100 MG capsule Take 1 capsule (100 mg total) by mouth at bedtime as needed. (Patient taking differently: Take 50 mg by mouth at bedtime as needed (leg shakes).)   sertraline (ZOLOFT) 25 MG tablet Take 1 tablet (25 mg total) by mouth daily.     Allergies:    Sulfa antibiotics   Social History: Social History   Socioeconomic History   Marital status: Single    Spouse name: Not on file   Number of children: Not on file   Years of education: Not on file   Highest education level: Not on file  Occupational History   Occupation: Secretary/administrator  Tobacco Use   Smoking status: Former    Packs/day: 0.50    Years: 10.00    Pack years: 5.00    Types: Cigarettes  Smokeless tobacco: Never  Vaping Use   Vaping Use: Some days  Substance and Sexual Activity   Alcohol use: Yes    Alcohol/week: 1.0 standard drink    Types: 1 Glasses of wine per week   Drug use: No   Sexual activity: Not on file  Other Topics Concern   Not on file  Social History Narrative   Not on file   Social Determinants of Health   Financial Resource Strain: Not on file  Food Insecurity: Not on file  Transportation Needs: Not on file  Physical Activity: Not on file  Stress: Not on file  Social Connections: Not on file     Family History: The patient's family history includes Diabetes in her mother; Heart disease in her father and mother; Hypertension in her father and mother.  ROS:   All other ROS reviewed and negative. Pertinent positives noted in the HPI.     EKGs/Labs/Other Studies Reviewed:   The following studies were  personally reviewed by me today:    CCTA 09/03/2019 IMPRESSION: 1. Coronary calcium score of 0.   2. Normal coronary origin with right dominance.   3. Normal coronary arteries.   4. Small PFO.  TTE 09/12/2019  1. Left ventricular ejection fraction, by estimation, is 55 to 60%. The  left ventricle has normal function. The left ventricle has no regional  wall motion abnormalities. Left ventricular diastolic parameters were  normal.   2. Right ventricular systolic function is normal. The right ventricular  size is normal. There is normal pulmonary artery systolic pressure. The  estimated right ventricular systolic pressure is 15.4 mmHg.   3. The mitral valve is normal. Trivial mitral valve regurgitation. No  prolapse.   4. The aortic valve is tricuspid. Aortic valve regurgitation is not  visualized.   5. Evidence of atrial level shunting detected by color flow Doppler.  There is a small patent foramen ovale with predominantly left to right  shunting across the atrial septum.   Recent Labs: 03/09/2021: BUN 7; Creatinine, Ser 0.95; Hemoglobin 13.1; Platelets 266; Potassium 4.0; Sodium 138   Recent Lipid Panel    Component Value Date/Time   CHOL 237 (H) 08/19/2019 1446   TRIG 112 08/19/2019 1446   HDL 74 08/19/2019 1446   CHOLHDL 3.2 08/19/2019 1446   LDLCALC 144 (H) 08/19/2019 1446    Physical Exam:   VS:  BP 110/73    Pulse 89    Ht 5\' 8"  (1.727 m)    Wt 166 lb 3.2 oz (75.4 kg)    SpO2 97%    BMI 25.27 kg/m    Wt Readings from Last 3 Encounters:  03/25/21 166 lb 3.2 oz (75.4 kg)  11/13/19 152 lb (68.9 kg)  08/30/19 151 lb 3.2 oz (68.6 kg)    General: Well nourished, well developed, in no acute distress Head: Atraumatic, normal size  Eyes: PEERLA, EOMI  Neck: Supple, no JVD Endocrine: No thryomegaly Cardiac: Normal S1, S2; RRR; no murmurs, rubs, or gallops Lungs: Clear to auscultation bilaterally, no wheezing, rhonchi or rales  Abd: Soft, nontender, no hepatomegaly   Ext: No edema, pulses 2+ Musculoskeletal: No deformities, BUE and BLE strength normal and equal Skin: Warm and dry, no rashes   Neuro: Alert and oriented to person, place, time, and situation, CNII-XII grossly intact, no focal deficits  Psych: Normal mood and affect   ASSESSMENT:   TYLEIGH MAHN is a 46 y.o. female who presents for the following: 1. Precordial pain  PLAN:   1. Precordial pain -Noncardiac chest pain.  Seen in the ER for a multitude of symptoms.  I suspect this is stress related.  Coronary CTA last year was normal.  Echo showed normal LV function.  She has no evidence of mitral valve prolapse which bothers her.  I informed her that her mitral valve is normal.  Her EKG also was normal in the ER.  Everything really is reassuring.  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  Stress is a major issue I suspect.  I recommend she start an antidepressant.  We will start her on Zoloft 25 mg daily.  For work-up she can continue with her primary care physician as I have no real cardiac reason for her symptoms.  She will continue with proper diet as well as regular hydration I believe this will help as well.  We will also get her husband in to see us given his change in symptoms.     Disposition: Return if symptoms worsen or fail to improve.  Medication Adjustments/Labs and Tests Ordered: Current medicines are reviewed at length with the patient today.  Concerns regarding medicines are outlined above.  No orders of the defined types were placed in this encounter.  Meds ordered this encounter  Medications   sertraline (ZOLOFT) 25 MG tablet    Sig: Take 1 tablet (25 mg total) by mouth daily.    Dispense:  30 tablet    Refill:  1    Patient Instructions  Medication Instructions:  Start Zoloft 25 mg daily   *If you need a refill on your cardiac medications before your next appointment, please call your pharmacy*  Follow-Up: At [facility], you and  your health needs are our priority.  As part of our continuing mission to provide you with exceptional heart care, we have created designated Provider Care Teams.  These Care Teams include your primary Cardiologist (physician) and Advanced Practice Providers (APPs -  Physician Assistants and Nurse Practitioners) who all work together to provide you with the care you need, when you need it.  We recommend signing up for the patient portal called "MyChart".  Sign up information is provided on this After Visit Summary.  MyChart is used to connect with patients for Virtual Visits (Telemedicine).  Patients are able to view lab/test results, encounter notes, upcoming appointments, etc.  Non-urgent messages can be sent to your provider as well.   To learn more about what you can do with MyChart, go to [email or web address].    Your next appointment:   As needed  The format for your next appointment:   In Person  Provider:   Lennie OdorWesley O'Neal, MD      Time Spent with Patient: I have spent a total of 25 minutes with patient reviewing hospital notes, telemetry, EKGs, labs and examining the patient as well as establishing an assessment and plan that was discussed with the patient.  > 50% of time was spent in direct patient care.  Signed, Lenna GilfordWesley T. Flora Lipps'Neal, MD, [facility] [place]   [facility]  [address], [address] [place], [place] [address] [phone]  03/25/2021 9:42 AM

## 2021-03-25 NOTE — Patient Instructions (Signed)
Medication Instructions:  Start Zoloft 25 mg daily   *If you need a refill on your cardiac medications before your next appointment, please call your pharmacy*  Follow-Up: At [facility], you and your health needs are our priority.  As part of our continuing mission to provide you with exceptional heart care, we have created designated Provider Care Teams.  These Care Teams include your primary Cardiologist (physician) and Advanced Practice Providers (APPs -  Physician Assistants and Nurse Practitioners) who all work together to provide you with the care you need, when you need it.  We recommend signing up for the patient portal called "MyChart".  Sign up information is provided on this After Visit Summary.  MyChart is used to connect with patients for Virtual Visits (Telemedicine).  Patients are able to view lab/test results, encounter notes, upcoming appointments, etc.  Non-urgent messages can be sent to your provider as well.   To learn more about what you can do with MyChart, go to [email or web address].    Your next appointment:   As needed  The format for your next appointment:   In Person  Provider:   Eleonore Chiquito, MD

## 2021-03-30 DIAGNOSIS — F4320 Adjustment disorder, unspecified: Secondary | ICD-10-CM | POA: Diagnosis not present

## 2021-04-06 DIAGNOSIS — F4320 Adjustment disorder, unspecified: Secondary | ICD-10-CM | POA: Diagnosis not present

## 2021-04-13 DIAGNOSIS — F4320 Adjustment disorder, unspecified: Secondary | ICD-10-CM | POA: Diagnosis not present

## 2021-04-19 ENCOUNTER — Ambulatory Visit: Payer: BC Managed Care – PPO | Admitting: General Practice

## 2021-04-20 DIAGNOSIS — F4320 Adjustment disorder, unspecified: Secondary | ICD-10-CM | POA: Diagnosis not present

## 2021-04-27 DIAGNOSIS — F4320 Adjustment disorder, unspecified: Secondary | ICD-10-CM | POA: Diagnosis not present

## 2021-05-11 DIAGNOSIS — F4320 Adjustment disorder, unspecified: Secondary | ICD-10-CM | POA: Diagnosis not present

## 2021-05-18 DIAGNOSIS — F4320 Adjustment disorder, unspecified: Secondary | ICD-10-CM | POA: Diagnosis not present

## 2021-06-01 DIAGNOSIS — F4320 Adjustment disorder, unspecified: Secondary | ICD-10-CM | POA: Diagnosis not present

## 2021-06-08 DIAGNOSIS — F4320 Adjustment disorder, unspecified: Secondary | ICD-10-CM | POA: Diagnosis not present

## 2021-06-15 DIAGNOSIS — F4320 Adjustment disorder, unspecified: Secondary | ICD-10-CM | POA: Diagnosis not present

## 2021-06-17 DIAGNOSIS — F411 Generalized anxiety disorder: Secondary | ICD-10-CM | POA: Diagnosis not present

## 2021-06-17 DIAGNOSIS — F4312 Post-traumatic stress disorder, chronic: Secondary | ICD-10-CM | POA: Diagnosis not present

## 2021-06-22 DIAGNOSIS — F4312 Post-traumatic stress disorder, chronic: Secondary | ICD-10-CM | POA: Diagnosis not present

## 2021-06-22 DIAGNOSIS — F411 Generalized anxiety disorder: Secondary | ICD-10-CM | POA: Diagnosis not present

## 2021-06-23 DIAGNOSIS — F411 Generalized anxiety disorder: Secondary | ICD-10-CM | POA: Diagnosis not present

## 2021-06-23 DIAGNOSIS — F4312 Post-traumatic stress disorder, chronic: Secondary | ICD-10-CM | POA: Diagnosis not present

## 2021-06-29 ENCOUNTER — Ambulatory Visit: Payer: BC Managed Care – PPO | Admitting: Registered Nurse

## 2021-06-29 ENCOUNTER — Encounter: Payer: Self-pay | Admitting: Registered Nurse

## 2021-06-29 VITALS — BP 118/68 | HR 88 | Temp 97.0°F | Resp 16 | Ht 68.0 in | Wt 167.4 lb

## 2021-06-29 DIAGNOSIS — R202 Paresthesia of skin: Secondary | ICD-10-CM

## 2021-06-29 DIAGNOSIS — F4312 Post-traumatic stress disorder, chronic: Secondary | ICD-10-CM | POA: Diagnosis not present

## 2021-06-29 DIAGNOSIS — Z5320 Procedure and treatment not carried out because of patient's decision for unspecified reasons: Secondary | ICD-10-CM | POA: Diagnosis not present

## 2021-06-29 DIAGNOSIS — G2581 Restless legs syndrome: Secondary | ICD-10-CM

## 2021-06-29 DIAGNOSIS — H538 Other visual disturbances: Secondary | ICD-10-CM

## 2021-06-29 DIAGNOSIS — Z1231 Encounter for screening mammogram for malignant neoplasm of breast: Secondary | ICD-10-CM

## 2021-06-29 DIAGNOSIS — F411 Generalized anxiety disorder: Secondary | ICD-10-CM | POA: Diagnosis not present

## 2021-06-29 MED ORDER — ROPINIROLE HCL 0.25 MG PO TABS: 0.2500 mg | ORAL_TABLET | Freq: Every day | ORAL | 0 refills | Status: DC

## 2021-06-29 NOTE — Patient Instructions (Signed)
Kathryn Brown -  ? ?Great to see you! ? ?Start requip nightly. 0.25mg  at first, then can go up to 0.5mg  nightly if not effective after 3-4 days. If not effective after 7 days, can go up to 0.75.  ?If too effective, can use once every few days. ? ?We will collect labs tomorrow to check out any more sinister causes. I will also coordinate brain MRI. We'll have a low threshold to get you in with a neurologist. ? ?Thanks, ? ?Rich  ?

## 2021-06-29 NOTE — Progress Notes (Signed)
? ?Established Patient Office Visit ? ?Subjective:  ?Patient ID: Kathryn Brown, female    DOB: [birth year]  Age: 46 y.o. MRN: [id number] ? ?CC:  ?Chief Complaint  ?Patient presents with  ? Referral  ?  Pt is here for a referral to a GYN  ?  ? Leg Pain  ?  Pt states she is still having issue with restless legs   ? ? ?HPI ?Kathryn Brown presents for referral, restless legs ? ?Referral ?Overdue for mammogram and pap ?Would like referral ?Has seen gyn in remote past, would like referral to first available. ?No symptoms of concerns. ? ?RLS ?Ongoing issue. ?Had used gabapentin in the past but too effective, too sedative ?Would be interested in alternatives. ?She is interested in exploring this more - she is overdue for some labs ?She has had some tingling and paresthesias in extremities, she has had visual changes- she has felt like her rx is already out of date despite updated rx by optometry within 6 mo ago.  ?She does have a cousin with MS, grandmother with thyroid disease and pituitary tumor.  ?No new headaches, LOC, muscle aches, joint pains, GU or GI symptoms.  ? ?Outpatient Medications Prior to Visit  ?Medication Sig Dispense Refill  ? aspirin EC 81 MG tablet Take 81 mg by mouth daily. Swallow whole. (Patient not taking: Reported on 06/29/2021)    ? gabapentin (NEURONTIN) 100 MG capsule Take 1 capsule (100 mg total) by mouth at bedtime as needed. (Patient not taking: Reported on 06/29/2021) 30 capsule 0  ? hydrOXYzine (VISTARIL) 25 MG capsule Take 1 capsule (25 mg total) by mouth every 8 (eight) hours as needed. (Patient not taking: Reported on 03/25/2021) 30 capsule 0  ? sertraline (ZOLOFT) 25 MG tablet Take 1 tablet (25 mg total) by mouth daily. (Patient not taking: Reported on 06/29/2021) 30 tablet 1  ? triamcinolone cream (KENALOG) 0.1 % Apply 1 application topically 2 (two) times daily. (Patient not taking: Reported on 03/25/2021) 30 g 0  ? ?No facility-administered medications prior to visit.  ? ? ?Review of Systems   ?Constitutional: Negative.   ?HENT: Negative.    ?Eyes: Negative.   ?Respiratory: Negative.    ?Cardiovascular: Negative.   ?Gastrointestinal: Negative.   ?Genitourinary: Negative.   ?Musculoskeletal: Negative.   ?Skin: Negative.   ?Neurological: Negative.   ?Psychiatric/Behavioral: Negative.    ?All other systems reviewed and are negative. ? ?  ?Objective:  ?  ? ?BP 118/68   Pulse 88   Temp (!) 97 ?F (36.1 ?C) (Temporal)   Resp 16   Ht 5\' 8"  (1.727 m)   Wt 167 lb 6.4 oz (75.9 kg)   SpO2 99%   BMI 25.45 kg/m?  ? ?Wt Readings from Last 3 Encounters:  ?06/29/21 167 lb 6.4 oz (75.9 kg)  ?03/25/21 166 lb 3.2 oz (75.4 kg)  ?11/13/19 152 lb (68.9 kg)  ? ?Physical Exam ?Vitals and nursing note reviewed.  ?Constitutional:   ?   General: She is not in acute distress. ?   Appearance: Normal appearance. She is normal weight. She is not ill-appearing, toxic-appearing or diaphoretic.  ?Cardiovascular:  ?   Rate and Rhythm: Normal rate and regular rhythm.  ?   Heart sounds: Normal heart sounds. No murmur heard. ?  No friction rub. No gallop.  ?Pulmonary:  ?   Effort: Pulmonary effort is normal. No respiratory distress.  ?   Breath sounds: Normal breath sounds. No stridor. No wheezing, rhonchi or rales.  ?  Chest:  ?   Chest wall: No tenderness.  ?Skin: ?   General: Skin is warm and dry.  ?Neurological:  ?   General: No focal deficit present.  ?   Mental Status: She is alert and oriented to person, place, and time. Mental status is at baseline.  ?Psychiatric:     ?   Mood and Affect: Mood normal.     ?   Behavior: Behavior normal.     ?   Thought Content: Thought content normal.     ?   Judgment: Judgment normal.  ? ? ?No results found for any visits on 06/29/21. ? ? ? ?The 10-year ASCVD risk score (Arnett DK, et al., 2019) is: 0.7% ? ?  ?Assessment & Plan:  ? ?Problem List Items Addressed This Visit   ?None ?Visit Diagnoses   ? ? RLS (restless legs syndrome)    -  Primary  ? Relevant Medications  ? rOPINIRole (REQUIP) 0.25  MG tablet  ? Other Relevant Orders  ? CBC with Differential/Platelet  ? Comprehensive metabolic panel  ? Hemoglobin A1c  ? Lipid panel  ? TSH  ? T4, free  ? Antinuclear Antib (ANA)  ? MR Brain W Wo Contrast  ? Screening mammogram for breast cancer      ? Relevant Orders  ? MM Digital Screening  ? Pap smear of cervix declined      ? Relevant Orders  ? Ambulatory referral to Gynecology  ? Blurred vision      ? Relevant Orders  ? CBC with Differential/Platelet  ? Comprehensive metabolic panel  ? Hemoglobin A1c  ? Lipid panel  ? TSH  ? T4, free  ? Antinuclear Antib (ANA)  ? MR Brain W Wo Contrast  ? Paresthesia      ? Relevant Orders  ? CBC with Differential/Platelet  ? Comprehensive metabolic panel  ? Hemoglobin A1c  ? Lipid panel  ? TSH  ? T4, free  ? Antinuclear Antib (ANA)  ? MR Brain W Wo Contrast  ? ?  ? ? ?Meds ordered this encounter  ?Medications  ? rOPINIRole (REQUIP) 0.25 MG tablet  ?  Sig: Take 1-3 tablets (0.25-0.75 mg total) by mouth at bedtime.  ?  Dispense:  90 tablet  ?  Refill:  0  ?  Order Specific Question:   Supervising Provider  ?  Answer:   Carlota Raspberry, JEFFREY R [[id number]]  ? ? ?Return in about 1 day (around 06/30/2021) for labs.  ? ?PLAN ?Refer to Gyn. MM ordered as above. ?Will try requip nightly at 0.25mg  for a few days, then 0.5 for a few days. If no effect, can go up to 0.75mg . will follow up with patient on effect. Can consider neuro referral. ?Will order labs to rule out contributing metabolic, heme, or endocrine cause. Given progressive visual changes and fam hx of MS, will broaden scope of labs and order MRI brain. Low threshold for referral to neuro with concerning abnormalities or progressive symptoms. ?Patient encouraged to call clinic with any questions, comments, or concerns. ? ? ?Maximiano Coss, NP ?

## 2021-06-30 ENCOUNTER — Other Ambulatory Visit: Payer: BC Managed Care – PPO

## 2021-06-30 DIAGNOSIS — F411 Generalized anxiety disorder: Secondary | ICD-10-CM | POA: Diagnosis not present

## 2021-06-30 DIAGNOSIS — F4312 Post-traumatic stress disorder, chronic: Secondary | ICD-10-CM | POA: Diagnosis not present

## 2021-07-02 ENCOUNTER — Other Ambulatory Visit: Payer: BC Managed Care – PPO

## 2021-07-05 ENCOUNTER — Other Ambulatory Visit (INDEPENDENT_AMBULATORY_CARE_PROVIDER_SITE_OTHER): Payer: BC Managed Care – PPO

## 2021-07-05 DIAGNOSIS — F4312 Post-traumatic stress disorder, chronic: Secondary | ICD-10-CM | POA: Diagnosis not present

## 2021-07-05 DIAGNOSIS — H538 Other visual disturbances: Secondary | ICD-10-CM

## 2021-07-05 DIAGNOSIS — G2581 Restless legs syndrome: Secondary | ICD-10-CM

## 2021-07-05 DIAGNOSIS — F411 Generalized anxiety disorder: Secondary | ICD-10-CM | POA: Diagnosis not present

## 2021-07-05 DIAGNOSIS — R202 Paresthesia of skin: Secondary | ICD-10-CM

## 2021-07-05 DIAGNOSIS — F4320 Adjustment disorder, unspecified: Secondary | ICD-10-CM | POA: Diagnosis not present

## 2021-07-06 DIAGNOSIS — F411 Generalized anxiety disorder: Secondary | ICD-10-CM | POA: Diagnosis not present

## 2021-07-06 DIAGNOSIS — F4312 Post-traumatic stress disorder, chronic: Secondary | ICD-10-CM | POA: Diagnosis not present

## 2021-07-06 LAB — CBC WITH DIFFERENTIAL/PLATELET
Basophils Absolute: 0.1 10*3/uL (ref 0.0–0.1)
Basophils Relative: 0.7 % (ref 0.0–3.0)
Eosinophils Absolute: 0.1 10*3/uL (ref 0.0–0.7)
Eosinophils Relative: 1.5 % (ref 0.0–5.0)
HCT: 38.2 % (ref 36.0–46.0)
Hemoglobin: 12.9 g/dL (ref 12.0–15.0)
Lymphocytes Relative: 22.2 % (ref 12.0–46.0)
Lymphs Abs: 2 10*3/uL (ref 0.7–4.0)
MCHC: 33.7 g/dL (ref 30.0–36.0)
MCV: 89 fl (ref 78.0–100.0)
Monocytes Absolute: 0.4 10*3/uL (ref 0.1–1.0)
Monocytes Relative: 4.5 % (ref 3.0–12.0)
Neutro Abs: 6.3 10*3/uL (ref 1.4–7.7)
Neutrophils Relative %: 71.1 % (ref 43.0–77.0)
Platelets: 257 10*3/uL (ref 150.0–400.0)
RBC: 4.29 Mil/uL (ref 3.87–5.11)
RDW: 13.7 % (ref 11.5–15.5)
WBC: 8.9 10*3/uL (ref 4.0–10.5)

## 2021-07-06 LAB — LIPID PANEL
Cholesterol: 216 mg/dL — ABNORMAL HIGH (ref 0–200)
HDL: 76 mg/dL (ref >39.00)
LDL Cholesterol: 121 mg/dL — ABNORMAL HIGH (ref 0–99)
NonHDL: 139.78
Total CHOL/HDL Ratio: 3
Triglycerides: 93 mg/dL (ref 0.0–149.0)
VLDL: 18.6 mg/dL (ref 0.0–40.0)

## 2021-07-06 LAB — COMPREHENSIVE METABOLIC PANEL
ALT: 18 U/L (ref 0–35)
AST: 17 U/L (ref 0–37)
Albumin: 4.3 g/dL (ref 3.5–5.2)
Alkaline Phosphatase: 61 U/L (ref 39–117)
BUN: 11 mg/dL (ref 6–23)
CO2: 24 mEq/L (ref 19–32)
Calcium: 9.1 mg/dL (ref 8.4–10.5)
Chloride: 102 mEq/L (ref 96–112)
Creatinine, Ser: 0.85 mg/dL (ref 0.40–1.20)
GFR: 81.61 mL/min (ref >60.00)
Glucose, Bld: 68 mg/dL — ABNORMAL LOW (ref 70–99)
Potassium: 3.9 mEq/L (ref 3.5–5.1)
Sodium: 138 mEq/L (ref 135–145)
Total Bilirubin: 0.4 mg/dL (ref 0.2–1.2)
Total Protein: 6.7 g/dL (ref 6.0–8.3)

## 2021-07-06 LAB — HEMOGLOBIN A1C: Hgb A1c MFr Bld: 5.2 % (ref 4.6–6.5)

## 2021-07-06 LAB — T4, FREE: Free T4: 0.71 ng/dL (ref 0.60–1.60)

## 2021-07-06 LAB — TSH: TSH: 1.15 u[IU]/mL (ref 0.35–5.50)

## 2021-07-07 ENCOUNTER — Encounter: Payer: Self-pay | Admitting: Registered Nurse

## 2021-07-07 LAB — ANA: Anti Nuclear Antibody (ANA): NEGATIVE

## 2021-07-07 NOTE — Telephone Encounter (Signed)
Please advise message below and I will call patient to result labs ?

## 2021-07-12 DIAGNOSIS — F4312 Post-traumatic stress disorder, chronic: Secondary | ICD-10-CM | POA: Diagnosis not present

## 2021-07-12 DIAGNOSIS — F411 Generalized anxiety disorder: Secondary | ICD-10-CM | POA: Diagnosis not present

## 2021-07-13 DIAGNOSIS — F4312 Post-traumatic stress disorder, chronic: Secondary | ICD-10-CM | POA: Diagnosis not present

## 2021-07-13 DIAGNOSIS — F411 Generalized anxiety disorder: Secondary | ICD-10-CM | POA: Diagnosis not present

## 2021-07-19 DIAGNOSIS — F4312 Post-traumatic stress disorder, chronic: Secondary | ICD-10-CM | POA: Diagnosis not present

## 2021-07-19 DIAGNOSIS — F411 Generalized anxiety disorder: Secondary | ICD-10-CM | POA: Diagnosis not present

## 2021-07-20 DIAGNOSIS — F4312 Post-traumatic stress disorder, chronic: Secondary | ICD-10-CM | POA: Diagnosis not present

## 2021-07-20 DIAGNOSIS — F411 Generalized anxiety disorder: Secondary | ICD-10-CM | POA: Diagnosis not present

## 2021-07-27 ENCOUNTER — Other Ambulatory Visit: Payer: BC Managed Care – PPO

## 2021-07-27 ENCOUNTER — Encounter: Payer: Self-pay | Admitting: Registered Nurse

## 2021-07-28 ENCOUNTER — Other Ambulatory Visit: Payer: Self-pay | Admitting: Registered Nurse

## 2021-07-28 ENCOUNTER — Ambulatory Visit
Admission: RE | Admit: 2021-07-28 | Discharge: 2021-07-28 | Disposition: A | Discharge status: Home / Self Care | Payer: BC Managed Care – PPO | Source: Ambulatory Visit | Attending: Registered Nurse | Admitting: Registered Nurse

## 2021-07-28 DIAGNOSIS — G2581 Restless legs syndrome: Secondary | ICD-10-CM

## 2021-07-28 DIAGNOSIS — Z1231 Encounter for screening mammogram for malignant neoplasm of breast: Secondary | ICD-10-CM | POA: Diagnosis not present

## 2021-07-28 NOTE — Telephone Encounter (Signed)
Pt reports ropinirole causing worse leg spasms and is requesting change back to Gabapentin please advise   Also canceled MRI due to non coverage

## 2021-07-29 DIAGNOSIS — F4312 Post-traumatic stress disorder, chronic: Secondary | ICD-10-CM | POA: Diagnosis not present

## 2021-07-29 DIAGNOSIS — F411 Generalized anxiety disorder: Secondary | ICD-10-CM | POA: Diagnosis not present

## 2021-07-30 ENCOUNTER — Other Ambulatory Visit: Payer: Self-pay | Admitting: Registered Nurse

## 2021-07-30 DIAGNOSIS — G2581 Restless legs syndrome: Secondary | ICD-10-CM

## 2021-07-30 MED ORDER — PREGABALIN 50 MG PO CAPS: 50.0000 mg | ORAL_CAPSULE | Freq: Every evening | ORAL | 0 refills | Status: DC | PRN

## 2021-08-02 ENCOUNTER — Other Ambulatory Visit: Payer: Self-pay | Admitting: Registered Nurse

## 2021-08-02 DIAGNOSIS — F411 Generalized anxiety disorder: Secondary | ICD-10-CM | POA: Diagnosis not present

## 2021-08-02 DIAGNOSIS — R928 Other abnormal and inconclusive findings on diagnostic imaging of breast: Secondary | ICD-10-CM

## 2021-08-02 DIAGNOSIS — F4312 Post-traumatic stress disorder, chronic: Secondary | ICD-10-CM | POA: Diagnosis not present

## 2021-08-03 DIAGNOSIS — F4312 Post-traumatic stress disorder, chronic: Secondary | ICD-10-CM | POA: Diagnosis not present

## 2021-08-03 DIAGNOSIS — F411 Generalized anxiety disorder: Secondary | ICD-10-CM | POA: Diagnosis not present

## 2021-08-06 ENCOUNTER — Ambulatory Visit
Admission: RE | Admit: 2021-08-06 | Discharge: 2021-08-06 | Disposition: A | Discharge status: Home / Self Care | Payer: BC Managed Care – PPO | Source: Ambulatory Visit | Attending: Registered Nurse | Admitting: Registered Nurse

## 2021-08-06 DIAGNOSIS — R928 Other abnormal and inconclusive findings on diagnostic imaging of breast: Secondary | ICD-10-CM

## 2021-08-06 DIAGNOSIS — N6012 Diffuse cystic mastopathy of left breast: Secondary | ICD-10-CM | POA: Diagnosis not present

## 2021-08-06 DIAGNOSIS — R922 Inconclusive mammogram: Secondary | ICD-10-CM | POA: Diagnosis not present

## 2021-08-10 DIAGNOSIS — F4312 Post-traumatic stress disorder, chronic: Secondary | ICD-10-CM | POA: Diagnosis not present

## 2021-08-10 DIAGNOSIS — F411 Generalized anxiety disorder: Secondary | ICD-10-CM | POA: Diagnosis not present

## 2021-09-07 DIAGNOSIS — F411 Generalized anxiety disorder: Secondary | ICD-10-CM | POA: Diagnosis not present

## 2021-09-07 DIAGNOSIS — F4312 Post-traumatic stress disorder, chronic: Secondary | ICD-10-CM | POA: Diagnosis not present

## 2021-09-14 DIAGNOSIS — F411 Generalized anxiety disorder: Secondary | ICD-10-CM | POA: Diagnosis not present

## 2021-09-14 DIAGNOSIS — F4312 Post-traumatic stress disorder, chronic: Secondary | ICD-10-CM | POA: Diagnosis not present

## 2021-09-21 ENCOUNTER — Other Ambulatory Visit (HOSPITAL_COMMUNITY)
Admission: RE | Admit: 2021-09-21 | Discharge: 2021-09-21 | Disposition: A | Discharge status: Home / Self Care | Payer: BC Managed Care – PPO | Source: Ambulatory Visit | Attending: Advanced Practice Midwife | Admitting: Advanced Practice Midwife

## 2021-09-21 ENCOUNTER — Encounter (HOSPITAL_BASED_OUTPATIENT_CLINIC_OR_DEPARTMENT_OTHER): Payer: Self-pay | Admitting: Advanced Practice Midwife

## 2021-09-21 ENCOUNTER — Ambulatory Visit (INDEPENDENT_AMBULATORY_CARE_PROVIDER_SITE_OTHER): Payer: BC Managed Care – PPO | Admitting: Advanced Practice Midwife

## 2021-09-21 DIAGNOSIS — F411 Generalized anxiety disorder: Secondary | ICD-10-CM | POA: Diagnosis not present

## 2021-09-21 DIAGNOSIS — Z124 Encounter for screening for malignant neoplasm of cervix: Secondary | ICD-10-CM | POA: Insufficient documentation

## 2021-09-21 DIAGNOSIS — R635 Abnormal weight gain: Secondary | ICD-10-CM

## 2021-09-21 DIAGNOSIS — Z9889 Other specified postprocedural states: Secondary | ICD-10-CM | POA: Diagnosis not present

## 2021-09-21 DIAGNOSIS — Z01419 Encounter for gynecological examination (general) (routine) without abnormal findings: Secondary | ICD-10-CM | POA: Insufficient documentation

## 2021-09-21 DIAGNOSIS — F4312 Post-traumatic stress disorder, chronic: Secondary | ICD-10-CM | POA: Diagnosis not present

## 2021-09-21 DIAGNOSIS — N939 Abnormal uterine and vaginal bleeding, unspecified: Secondary | ICD-10-CM | POA: Diagnosis not present

## 2021-09-21 MED ORDER — NORETHIN-ETH ESTRAD-FE BIPHAS 1 MG-10 MCG / 10 MCG PO TABS: 1.0000 | ORAL_TABLET | Freq: Every day | ORAL | 11 refills | Status: DC

## 2021-09-21 NOTE — Progress Notes (Signed)
   Subjective:     Kathryn Brown is a 46 y.o. female here at [facility] for a routine exam.  Current complaints: Bleeding is heavier, more painful, less regular in last year.  Every 4-6 weeks, lasting 5 days, heavy with clots.  Personal health questionnaire reviewed: yes.  Do you have a primary care provider? yes Do you feel safe at home? yes  Flowsheet Row Office Visit from 09/21/2021 in [facility]  PHQ-2 Total Score 0       There are no preventive care reminders to display for this patient.   Risk factors for chronic health problems: Smoking: Alchohol/how much: Pt BMI: There is no height or weight on file to calculate BMI.   Gynecologic History No LMP recorded. Patient is premenopausal. Contraception: vasectomy Last Pap: 2017 followed by colposcopy and LEEP.  Last mammogram: 08/06/21. Results were: normal  Obstetric History OB History  Gravida Para Term Preterm AB Living  2 2       2   SAB IAB Ectopic Multiple Live Births               # Outcome Date GA Lbr Len/2nd Weight Sex Delivery Anes PTL Lv  2 Para           1 Para              The following portions of the patient's history were reviewed and updated as appropriate: allergies, current medications, past family history, past medical history, past social history, past surgical history, and problem list.  Review of Systems Pertinent items noted in HPI and remainder of comprehensive ROS otherwise negative.    Objective:   There were no vitals taken for this visit. VS reviewed, nursing note reviewed,  Constitutional: well developed, well nourished, no distress HEENT: normocephalic CV: normal rate Pulm/chest wall: normal effort Breast Exam: exam performed: right breast normal without mass, skin or nipple changes or axillary nodes, left breast normal without mass, skin or nipple changes or axillary nodes Abdomen: soft Neuro: alert and oriented x 3 Skin: warm, dry Psych: affect  normal Pelvic exam: Performed: Cervix pink, visually closed, without lesion, scant white creamy discharge, vaginal walls and external genitalia normal Bimanual exam: Cervix 0/long/high, firm, anterior, neg CMT, uterus nontender, nonenlarged, adnexa without tenderness, enlargement, or mass       Assessment/Plan:   1. Encounter for gynecological examination with Papanicolaou smear of cervix  - Cytology - PAP( [place])  2. Cervical cancer screening  - Cytology - PAP( [employer])  3. Abnormal uterine bleeding (AUB) --Reviewed options including OCPs, POPs, IUD, Depo Provera, surgical options to treat AUB.   --Pt nonsmoker (former smoker, quit 3 years ago), no HTN, no migraines.  Age over 57 is only risk for OCPs. --Will  try lo lo estrin for lowest estrogen OCP to reduce bleeding and cramping with periods.   --Evaluate TSH given additional mood and weight gain symptoms --F/U in 3 months  - Norethindrone-Ethinyl Estradiol-Fe Biphas (LO LOESTRIN FE) 1 MG-10 MCG / 10 MCG tablet; Take 1 tablet by mouth daily.  Dispense: 28 tablet; Refill: 11 - TSH  4. Unexplained weight gain --TSH today   5. History of loop electrical excision procedure (LEEP) --LEEP in 2017, no Pap follow up since then, Pap today  Return in about 3 months (around 12/22/2021) for Gyn follow up for Abnormal Uterine Bleeding.   12/24/2021, CNM 3:48 PM

## 2021-09-22 ENCOUNTER — Encounter: Payer: Self-pay | Admitting: Registered Nurse

## 2021-09-22 LAB — TSH: TSH: 1.38 u[IU]/mL (ref 0.450–4.500)

## 2021-09-27 LAB — CYTOLOGY - PAP
Comment: NEGATIVE
Diagnosis: NEGATIVE
High risk HPV: NEGATIVE

## 2021-09-27 NOTE — Progress Notes (Signed)
I am a co-worker of [employer]. I am reviewing results while she is out of the office. Your Pap smear was normal and you tested negative for Human Papilloma Virus (HPV), the virus that causes most cervical cancer. I saw in her note that you had a LEEP in 2017. What was the result of your prior Pap smear? That will determine when we recommend you get your next Pap smear.

## 2021-09-28 DIAGNOSIS — F4312 Post-traumatic stress disorder, chronic: Secondary | ICD-10-CM | POA: Diagnosis not present

## 2021-09-28 DIAGNOSIS — F411 Generalized anxiety disorder: Secondary | ICD-10-CM | POA: Diagnosis not present

## 2021-10-12 DIAGNOSIS — F411 Generalized anxiety disorder: Secondary | ICD-10-CM | POA: Diagnosis not present

## 2021-10-12 DIAGNOSIS — F4312 Post-traumatic stress disorder, chronic: Secondary | ICD-10-CM | POA: Diagnosis not present

## 2021-10-13 DIAGNOSIS — M9902 Segmental and somatic dysfunction of thoracic region: Secondary | ICD-10-CM | POA: Diagnosis not present

## 2021-10-13 DIAGNOSIS — M9903 Segmental and somatic dysfunction of lumbar region: Secondary | ICD-10-CM | POA: Diagnosis not present

## 2021-10-13 DIAGNOSIS — M9901 Segmental and somatic dysfunction of cervical region: Secondary | ICD-10-CM | POA: Diagnosis not present

## 2021-10-13 DIAGNOSIS — M5137 Other intervertebral disc degeneration, lumbosacral region: Secondary | ICD-10-CM | POA: Diagnosis not present

## 2021-10-13 DIAGNOSIS — M9905 Segmental and somatic dysfunction of pelvic region: Secondary | ICD-10-CM | POA: Diagnosis not present

## 2021-10-13 DIAGNOSIS — M5386 Other specified dorsopathies, lumbar region: Secondary | ICD-10-CM | POA: Diagnosis not present

## 2021-10-19 DIAGNOSIS — F4312 Post-traumatic stress disorder, chronic: Secondary | ICD-10-CM | POA: Diagnosis not present

## 2021-10-19 DIAGNOSIS — F411 Generalized anxiety disorder: Secondary | ICD-10-CM | POA: Diagnosis not present

## 2021-11-03 ENCOUNTER — Other Ambulatory Visit (HOSPITAL_BASED_OUTPATIENT_CLINIC_OR_DEPARTMENT_OTHER): Payer: BC Managed Care – PPO

## 2021-11-03 ENCOUNTER — Ambulatory Visit (HOSPITAL_BASED_OUTPATIENT_CLINIC_OR_DEPARTMENT_OTHER): Payer: BC Managed Care – PPO | Admitting: Obstetrics & Gynecology

## 2021-11-03 NOTE — Addendum Note (Signed)
Addended by: Harrie Jeans on: 11/03/2021 02:24 PM   Modules accepted: Orders

## 2021-11-09 DIAGNOSIS — F4312 Post-traumatic stress disorder, chronic: Secondary | ICD-10-CM | POA: Diagnosis not present

## 2021-11-09 DIAGNOSIS — F411 Generalized anxiety disorder: Secondary | ICD-10-CM | POA: Diagnosis not present

## 2021-11-16 DIAGNOSIS — F411 Generalized anxiety disorder: Secondary | ICD-10-CM | POA: Diagnosis not present

## 2021-11-16 DIAGNOSIS — F4312 Post-traumatic stress disorder, chronic: Secondary | ICD-10-CM | POA: Diagnosis not present

## 2021-11-23 DIAGNOSIS — F411 Generalized anxiety disorder: Secondary | ICD-10-CM | POA: Diagnosis not present

## 2021-11-23 DIAGNOSIS — F4312 Post-traumatic stress disorder, chronic: Secondary | ICD-10-CM | POA: Diagnosis not present

## 2021-12-01 DIAGNOSIS — F4312 Post-traumatic stress disorder, chronic: Secondary | ICD-10-CM | POA: Diagnosis not present

## 2021-12-01 DIAGNOSIS — F411 Generalized anxiety disorder: Secondary | ICD-10-CM | POA: Diagnosis not present

## 2021-12-09 DIAGNOSIS — F4312 Post-traumatic stress disorder, chronic: Secondary | ICD-10-CM | POA: Diagnosis not present

## 2021-12-09 DIAGNOSIS — F411 Generalized anxiety disorder: Secondary | ICD-10-CM | POA: Diagnosis not present

## 2021-12-16 DIAGNOSIS — F4312 Post-traumatic stress disorder, chronic: Secondary | ICD-10-CM | POA: Diagnosis not present

## 2021-12-16 DIAGNOSIS — F411 Generalized anxiety disorder: Secondary | ICD-10-CM | POA: Diagnosis not present

## 2021-12-17 ENCOUNTER — Encounter (HOSPITAL_BASED_OUTPATIENT_CLINIC_OR_DEPARTMENT_OTHER): Payer: Self-pay | Admitting: *Deleted

## 2021-12-27 DIAGNOSIS — F411 Generalized anxiety disorder: Secondary | ICD-10-CM | POA: Diagnosis not present

## 2021-12-27 DIAGNOSIS — F4312 Post-traumatic stress disorder, chronic: Secondary | ICD-10-CM | POA: Diagnosis not present

## 2022-01-04 DIAGNOSIS — F411 Generalized anxiety disorder: Secondary | ICD-10-CM | POA: Diagnosis not present

## 2022-01-04 DIAGNOSIS — F4312 Post-traumatic stress disorder, chronic: Secondary | ICD-10-CM | POA: Diagnosis not present

## 2022-01-15 ENCOUNTER — Telehealth: Payer: BC Managed Care – PPO | Admitting: Nurse Practitioner

## 2022-01-15 DIAGNOSIS — J069 Acute upper respiratory infection, unspecified: Secondary | ICD-10-CM

## 2022-01-15 DIAGNOSIS — R42 Dizziness and giddiness: Secondary | ICD-10-CM | POA: Diagnosis not present

## 2022-01-15 DIAGNOSIS — G2581 Restless legs syndrome: Secondary | ICD-10-CM

## 2022-01-15 MED ORDER — ALBUTEROL SULFATE HFA 108 (90 BASE) MCG/ACT IN AERS: 2.0000 | INHALATION_SPRAY | Freq: Four times a day (QID) | RESPIRATORY_TRACT | 0 refills | Status: DC | PRN

## 2022-01-15 MED ORDER — GABAPENTIN 100 MG PO CAPS: 100.0000 mg | ORAL_CAPSULE | Freq: Every day | ORAL | 0 refills | Status: DC

## 2022-01-15 MED ORDER — AMOXICILLIN-POT CLAVULANATE 875-125 MG PO TABS: 1.0000 | ORAL_TABLET | Freq: Two times a day (BID) | ORAL | 0 refills | Status: DC

## 2022-01-15 MED ORDER — MECLIZINE HCL 25 MG PO TABS: 25.0000 mg | ORAL_TABLET | Freq: Three times a day (TID) | ORAL | 0 refills | Status: DC | PRN

## 2022-01-15 NOTE — Progress Notes (Signed)
Virtual Visit Consent   Kathryn Brown, you are scheduled for a virtual visit with Mary-Margaret Daphine Deutscher, FNP, a [facility] provider, today.     Just as with appointments in the office, your consent must be obtained to participate.  Your consent will be active for this visit and any virtual visit you may have with one of our providers in the next 365 days.     If you have a MyChart account, a copy of this consent can be sent to you electronically.  All virtual visits are billed to your insurance company just like a traditional visit in the office.    As this is a virtual visit, video technology does not allow for your provider to perform a traditional examination.  This may limit your provider's ability to fully assess your condition.  If your provider identifies any concerns that need to be evaluated in person or the need to arrange testing (such as labs, EKG, etc.), we will make arrangements to do so.     Although advances in technology are sophisticated, we cannot ensure that it will always work on either your end or our end.  If the connection with a video visit is poor, the visit may have to be switched to a telephone visit.  With either a video or telephone visit, we are not always able to ensure that we have a secure connection.     I need to obtain your verbal consent now.   Are you willing to proceed with your visit today? YES   Kathryn Brown has provided verbal consent on 01/15/2022 for a virtual visit (video or telephone).   Mary-Margaret Daphine Deutscher, FNP   Date: 01/15/2022 3:47 PM   Virtual Visit via Video Note   I, Mary-Margaret Daphine Deutscher, connected with Kathryn Brown ([id number], 24-Dec-1975) on 01/15/22 at  3:45 PM EST by a video-enabled telemedicine application and verified that I am speaking with the correct person using two identifiers.  Location: Patient: Virtual Visit Location Patient: Home Provider: Virtual Visit Location Provider: Mobile   I discussed the limitations of  evaluation and management by telemedicine and the availability of in person appointments. The patient expressed understanding and agreed to proceed.    History of Present Illness: Kathryn Brown is a 46 y.o. who identifies as a female who was assigned female at birth, and is being seen today for flu like.  HPI: Influenza This is a new problem. The current episode started in the past 7 days. The problem occurs constantly. The problem has been gradually worsening. Associated symptoms include chest pain (with coughing), chills, congestion, fatigue, headaches, a sore throat and vertigo. Nothing aggravates the symptoms. Treatments tried: nyquil, ASA. The treatment provided mild relief.     *Lost her PCP and needs refill in neurontin prescription  Review of Systems  Constitutional:  Positive for chills and fatigue.  HENT:  Positive for congestion and sore throat.   Cardiovascular:  Positive for chest pain (with coughing).  Neurological:  Positive for vertigo and headaches.    Problems:  Patient Active Problem List   Diagnosis Date Noted   Abnormal uterine bleeding (AUB) 09/21/2021   History of loop electrical excision procedure (LEEP) 09/21/2021   Vitamin D deficiency 08/26/2019   Upper airway cough syndrome 08/06/2017   DOE (dyspnea on exertion) 08/04/2017   MVP (mitral valve prolapse) 10/30/2011    Allergies:  Allergies  Allergen Reactions   Sulfa Antibiotics Anaphylaxis   Medications:  Current Outpatient Medications:  Gabapentin (NEURONTIN PO), Take by mouth., Disp: , Rfl:    Norethindrone-Ethinyl Estradiol-Fe Biphas (LO LOESTRIN FE) 1 MG-10 MCG / 10 MCG tablet, Take 1 tablet by mouth daily., Disp: 28 tablet, Rfl: 11  Observations/Objective: Patient is well-developed, well-nourished in no acute distress.  Resting comfortably  at home.  Head is normocephalic, atraumatic.  No labored breathing.  Speech is clear and coherent with logical content.  Patient is alert and oriented  at baseline.    Assessment and Plan:  Kathryn Brown in today with chief complaint of flu like    1. URI with cough and congestion 1. Take meds as prescribed 2. Use a cool mist humidifier especially during the winter months and when heat has been humid. 3. Use saline nose sprays frequently 4. Saline irrigations of the nose can be very helpful if done frequently.  * 4X daily for 1 week*  * Use of a nettie pot can be helpful with this. Follow directions with this* 5. Drink plenty of fluids 6. Keep thermostat turn down low 7.For any cough or congestion- mucinex OTC 8. For fever or aces or pains- take tylenol or ibuprofen appropriate for age and weight.  * for fevers greater than 101 orally you may alternate ibuprofen and tylenol every  3 hours.    - amoxicillin-clavulanate (AUGMENTIN) 875-125 MG tablet; Take 1 tablet by mouth 2 (two) times daily.  Dispense: 14 tablet; Refill: 0 - albuterol (VENTOLIN HFA) 108 (90 Base) MCG/ACT inhaler; Inhale 2 puffs into the lungs every 6 (six) hours as needed for wheezing or shortness of breath.  Dispense: 8 g; Refill: 0  2. Vertigo - meclizine (ANTIVERT) 25 MG tablet; Take 1 tablet (25 mg total) by mouth 3 (three) times daily as needed for dizziness.  Dispense: 30 tablet; Refill: 0  3. RLS (restless legs syndrome) Will need to get new PCP for future refills - gabapentin (NEURONTIN) 100 MG capsule; Take 1 capsule (100 mg total) by mouth at bedtime.  Dispense: 30 capsule; Refill: 0    Follow Up Instructions: I discussed the assessment and treatment plan with the patient. The patient was provided an opportunity to ask questions and all were answered. The patient agreed with the plan and demonstrated an understanding of the instructions.  A copy of instructions were sent to the patient via MyChart.  The patient was advised to call back or seek an in-person evaluation if the symptoms worsen or if the condition fails to improve as anticipated.  Time:   I spent 8 minutes with the patient via telehealth technology discussing the above problems/concerns.    Mary-Margaret Daphine Deutscher, FNP

## 2022-01-15 NOTE — Patient Instructions (Signed)
Kathryn Brown, thank you for joining Chevis Pretty, FNP for today's virtual visit.  While this provider is not your primary care provider (PCP), if your PCP is located in our provider database this encounter information will be shared with them immediately following your visit.   [facility] account gives you access to today's visit and all your visits, tests, and labs performed at [facility] " click here if you don't have a [facility] account or go to [email or web address]  Consent: (Patient) Kathryn Brown provided verbal consent for this virtual visit at the beginning of the encounter.  Current Medications:  Current Outpatient Medications:    albuterol (VENTOLIN HFA) 108 (90 Base) MCG/ACT inhaler, Inhale 2 puffs into the lungs every 6 (six) hours as needed for wheezing or shortness of breath., Disp: 8 g, Rfl: 0   amoxicillin-clavulanate (AUGMENTIN) 875-125 MG tablet, Take 1 tablet by mouth 2 (two) times daily., Disp: 14 tablet, Rfl: 0   gabapentin (NEURONTIN) 100 MG capsule, Take 1 capsule (100 mg total) by mouth at bedtime., Disp: 30 capsule, Rfl: 0   meclizine (ANTIVERT) 25 MG tablet, Take 1 tablet (25 mg total) by mouth 3 (three) times daily as needed for dizziness., Disp: 30 tablet, Rfl: 0   Norethindrone-Ethinyl Estradiol-Fe Biphas (LO LOESTRIN FE) 1 MG-10 MCG / 10 MCG tablet, Take 1 tablet by mouth daily., Disp: 28 tablet, Rfl: 11   Medications ordered in this encounter:  Meds ordered this encounter  Medications   amoxicillin-clavulanate (AUGMENTIN) 875-125 MG tablet    Sig: Take 1 tablet by mouth 2 (two) times daily.    Dispense:  14 tablet    Refill:  0    Order Specific Question:   Supervising Provider    Answer:   Chase Picket [id number]   meclizine (ANTIVERT) 25 MG tablet    Sig: Take 1 tablet (25 mg total) by mouth 3 (three) times daily as needed for dizziness.    Dispense:  30 tablet    Refill:  0    Order Specific Question:    Supervising Provider    Answer:   Chase Picket [id number]   gabapentin (NEURONTIN) 100 MG capsule    Sig: Take 1 capsule (100 mg total) by mouth at bedtime.    Dispense:  30 capsule    Refill:  0    Order Specific Question:   Supervising Provider    Answer:   Bari Mantis   albuterol (VENTOLIN HFA) 108 (90 Base) MCG/ACT inhaler    Sig: Inhale 2 puffs into the lungs every 6 (six) hours as needed for wheezing or shortness of breath.    Dispense:  8 g    Refill:  0    Order Specific Question:   Supervising Provider    Answer:   Chase Picket [id number]     *If you need refills on other medications prior to your next appointment, please contact your pharmacy*  Follow-Up: Call back or seek an in-person evaluation if the symptoms worsen or if the condition fails to improve as anticipated.  [facility] [phone]  Other Instructions 1. Take meds as prescribed 2. Use a cool mist humidifier especially during the winter months and when heat has been humid. 3. Use saline nose sprays frequently 4. Saline irrigations of the nose can be very helpful if done frequently.  * 4X daily for 1 week*  * Use of a nettie pot can be helpful  with this. Follow directions with this* 5. Drink plenty of fluids 6. Keep thermostat turn down low 7.For any cough or congestion- mucinex otc 8. For fever or aces or pains- take tylenol or ibuprofen appropriate for age and weight.  * for fevers greater than 101 orally you may alternate ibuprofen and tylenol every  3 hours.      If you have been instructed to have an in-person evaluation today at a local Urgent Care facility, please use the link below. It will take you to a list of all of our available [facility], including address, phone number and hours of operation. Please do not delay care.  [facility]  If you or a family member do not have a primary care provider, use the link below to  schedule a visit and establish care. When you choose a [facility] primary care physician or advanced practice provider, you gain a long-term partner in health. Find a Primary Care Provider  Learn more about [facility]'s in-office and virtual care options: [facility] - Get Care Now

## 2022-01-17 ENCOUNTER — Telehealth: Payer: BC Managed Care – PPO | Admitting: Nurse Practitioner

## 2022-01-17 DIAGNOSIS — R062 Wheezing: Secondary | ICD-10-CM

## 2022-01-17 MED ORDER — PREDNISONE 10 MG (21) PO TBPK: ORAL_TABLET | ORAL | 0 refills | Status: DC

## 2022-01-17 NOTE — Progress Notes (Signed)
Virtual Visit Consent   Kathryn Brown, you are scheduled for a virtual visit with a [facility] provider today. Just as with appointments in the office, your consent must be obtained to participate. Your consent will be active for this visit and any virtual visit you may have with one of our providers in the next 365 days. If you have a MyChart account, a copy of this consent can be sent to you electronically.  As this is a virtual visit, video technology does not allow for your provider to perform a traditional examination. This may limit your provider's ability to fully assess your condition. If your provider identifies any concerns that need to be evaluated in person or the need to arrange testing (such as labs, EKG, etc.), we will make arrangements to do so. Although advances in technology are sophisticated, we cannot ensure that it will always work on either your end or our end. If the connection with a video visit is poor, the visit may have to be switched to a telephone visit. With either a video or telephone visit, we are not always able to ensure that we have a secure connection.  By engaging in this virtual visit, you consent to the provision of healthcare and authorize for your insurance to be billed (if applicable) for the services provided during this visit. Depending on your insurance coverage, you may receive a charge related to this service.  I need to obtain your verbal consent now. Are you willing to proceed with your visit today? Kathryn Brown has provided verbal consent on 01/17/2022 for a virtual visit (video or telephone). Viviano Simas, FNP  Date: 01/17/2022 1:43 PM  Virtual Visit via Video Note   I, Viviano Simas, connected with  Kathryn Brown  ([id number], 1975/06/08) on 01/17/22 at  1:45 PM EST by a video-enabled telemedicine application and verified that I am speaking with the correct person using two identifiers.  Location: Patient: Virtual Visit Location Patient:  Home Provider: Virtual Visit Location Provider: Home Office   I discussed the limitations of evaluation and management by telemedicine and the availability of in person appointments. The patient expressed understanding and agreed to proceed.    History of Present Illness: Kathryn Brown is a 46 y.o. who identifies as a female who was assigned female at birth, and is being seen today for follow up, she has been sick for over one week. Had VV 2 days ago She was seen 2 days ago and was prescribed Augmentin and Albuterol for URI/sinusitis.   She feels like she is "breathing in razor blades" She continues to feel like she is tight in the chest and that the inhaler is not relieving that.   She did have asthma as a child but has not used an inhaler since childhood.   She has been running a humidifier Continue to have chills She has been using Mucinex  Cough continues to be dry She does have nasal congestion   She has taken 4 doses so far of Augmentin-    Has not taken a COVID test  Problems:  Patient Active Problem List   Diagnosis Date Noted   Abnormal uterine bleeding (AUB) 09/21/2021   History of loop electrical excision procedure (LEEP) 09/21/2021   Vitamin D deficiency 08/26/2019   Upper airway cough syndrome 08/06/2017   DOE (dyspnea on exertion) 08/04/2017   MVP (mitral valve prolapse) 10/30/2011    Allergies:  Allergies  Allergen Reactions   Sulfa Antibiotics Anaphylaxis  Medications:  Current Outpatient Medications:    albuterol (VENTOLIN HFA) 108 (90 Base) MCG/ACT inhaler, Inhale 2 puffs into the lungs every 6 (six) hours as needed for wheezing or shortness of breath., Disp: 8 g, Rfl: 0   amoxicillin-clavulanate (AUGMENTIN) 875-125 MG tablet, Take 1 tablet by mouth 2 (two) times daily., Disp: 14 tablet, Rfl: 0   gabapentin (NEURONTIN) 100 MG capsule, Take 1 capsule (100 mg total) by mouth at bedtime., Disp: 30 capsule, Rfl: 0   meclizine (ANTIVERT) 25 MG tablet, Take 1  tablet (25 mg total) by mouth 3 (three) times daily as needed for dizziness., Disp: 30 tablet, Rfl: 0   Norethindrone-Ethinyl Estradiol-Fe Biphas (LO LOESTRIN FE) 1 MG-10 MCG / 10 MCG tablet, Take 1 tablet by mouth daily., Disp: 28 tablet, Rfl: 11  Observations/Objective: Patient is well-developed, well-nourished in no acute distress.  Resting comfortably  at home.  Head is normocephalic, atraumatic.  No labored breathing.  Speech is clear and coherent with logical content.  Patient is alert and oriented at baseline.    Assessment and Plan: 1. Wheezing  - predniSONE (STERAPRED UNI-PAK 21 TAB) 10 MG (21) TBPK tablet; Take 6 tablets on day one, 5 on day two, 4 on day three, 3 on day four, 2 on day five, and 1 on day six. Take with food.  Dispense: 21 tablet; Refill: 0     Follow Up Instructions: I discussed the assessment and treatment plan with the patient. The patient was provided an opportunity to ask questions and all were answered. The patient agreed with the plan and demonstrated an understanding of the instructions.  A copy of instructions were sent to the patient via MyChart unless otherwise noted below.    The patient was advised to call back or seek an in-person evaluation if the symptoms worsen or if the condition fails to improve as anticipated.  Time:  I spent 15 minutes with the patient via telehealth technology discussing the above problems/concerns.    Viviano Simas, FNP

## 2022-01-17 NOTE — Patient Instructions (Addendum)
Prednisone instructions: Take 6 tablets on day one, 5 on day two, 4 on day three, 3 on day four, 2 on day five, and 1 on day six. Take with food.

## 2022-01-31 ENCOUNTER — Ambulatory Visit
Admission: EM | Admit: 2022-01-31 | Discharge: 2022-01-31 | Disposition: A | Discharge status: Home / Self Care | Payer: BC Managed Care – PPO | Attending: Urgent Care | Admitting: Urgent Care

## 2022-01-31 ENCOUNTER — Ambulatory Visit (INDEPENDENT_AMBULATORY_CARE_PROVIDER_SITE_OTHER): Payer: BC Managed Care – PPO

## 2022-01-31 DIAGNOSIS — S93401A Sprain of unspecified ligament of right ankle, initial encounter: Secondary | ICD-10-CM

## 2022-01-31 DIAGNOSIS — S93601A Unspecified sprain of right foot, initial encounter: Secondary | ICD-10-CM

## 2022-01-31 DIAGNOSIS — M79671 Pain in right foot: Secondary | ICD-10-CM | POA: Diagnosis not present

## 2022-01-31 DIAGNOSIS — G2581 Restless legs syndrome: Secondary | ICD-10-CM | POA: Diagnosis not present

## 2022-01-31 DIAGNOSIS — M25571 Pain in right ankle and joints of right foot: Secondary | ICD-10-CM | POA: Diagnosis not present

## 2022-01-31 DIAGNOSIS — Z760 Encounter for issue of repeat prescription: Secondary | ICD-10-CM

## 2022-01-31 DIAGNOSIS — M7989 Other specified soft tissue disorders: Secondary | ICD-10-CM | POA: Diagnosis not present

## 2022-01-31 MED ORDER — GABAPENTIN 100 MG PO CAPS: 100.0000 mg | ORAL_CAPSULE | Freq: Every day | ORAL | 0 refills | Status: DC

## 2022-01-31 MED ORDER — NAPROXEN 500 MG PO TABS: 500.0000 mg | ORAL_TABLET | Freq: Two times a day (BID) | ORAL | 0 refills | Status: DC

## 2022-01-31 NOTE — ED Provider Notes (Signed)
[facility]  Note:  This document was prepared using Conservation officer, historic buildings and may include unintentional dictation errors.  MRN: [id number] DOB: Jan 25, 1976  Subjective:   Kathryn Brown is a 46 y.o. female presenting for 1 week history of persistent right ankle pain, right ankle swelling, right foot pain and swelling, bruising.  Symptoms started when she accidentally fell and hyperextended her foot and ankle.  Has since had the aforementioned symptoms.  She is able to bear weight but with significant difficulty.  Does not want crutches.  No current facility-administered medications for this encounter.  Current Outpatient Medications:    albuterol (VENTOLIN HFA) 108 (90 Base) MCG/ACT inhaler, Inhale 2 puffs into the lungs every 6 (six) hours as needed for wheezing or shortness of breath., Disp: 8 g, Rfl: 0   amoxicillin-clavulanate (AUGMENTIN) 875-125 MG tablet, Take 1 tablet by mouth 2 (two) times daily., Disp: 14 tablet, Rfl: 0   gabapentin (NEURONTIN) 100 MG capsule, Take 1 capsule (100 mg total) by mouth at bedtime., Disp: 30 capsule, Rfl: 0   meclizine (ANTIVERT) 25 MG tablet, Take 1 tablet (25 mg total) by mouth 3 (three) times daily as needed for dizziness., Disp: 30 tablet, Rfl: 0   Norethindrone-Ethinyl Estradiol-Fe Biphas (LO LOESTRIN FE) 1 MG-10 MCG / 10 MCG tablet, Take 1 tablet by mouth daily., Disp: 28 tablet, Rfl: 11   predniSONE (STERAPRED UNI-PAK 21 TAB) 10 MG (21) TBPK tablet, Take 6 tablets on day one, 5 on day two, 4 on day three, 3 on day four, 2 on day five, and 1 on day six. Take with food., Disp: 21 tablet, Rfl: 0   Allergies  Allergen Reactions   Sulfa Antibiotics Anaphylaxis    Past Medical History:  Diagnosis Date   Chest pain    Heart murmur    Heart palpitations    History of stomach ulcers    as a child   MVP (mitral valve prolapse) 10/30/2011     Past Surgical History:  Procedure Laterality Date   TONSILECTOMY,  ADENOIDECTOMY, BILATERAL MYRINGOTOMY AND TUBES     TONSILLECTOMY      Family History  Problem Relation Age of Onset   Heart disease Father    Hypertension Father    Heart disease Mother    Hypertension Mother    Breast cancer Neg Hx     Social History   Tobacco Use   Smoking status: Former    Packs/day: 0.50    Years: 10.00    Total pack years: 5.00    Types: Cigarettes   Smokeless tobacco: Never  Vaping Use   Vaping Use: Every day   Substances: Flavoring  Substance Use Topics   Alcohol use: Not Currently    Alcohol/week: 1.0 standard drink of alcohol    Types: 1 Glasses of wine per week   Drug use: No    ROS   Objective:   Vitals: BP 106/62 (BP Location: Right Arm)   Pulse 65   Temp 99.5 F (37.5 C) (Oral)   Resp 18   Ht 5\' 8"  (1.727 m)   Wt 150 lb (68 kg)   SpO2 97%   BMI 22.81 kg/m   Physical Exam Constitutional:      General: She is not in acute distress.    Appearance: Normal appearance. She is well-developed. She is not ill-appearing, toxic-appearing or diaphoretic.  HENT:     Head: Normocephalic and atraumatic.     Nose: Nose normal.  Mouth/Throat:     Mouth: Mucous membranes are moist.  Eyes:     General: No scleral icterus.       Right eye: No discharge.        Left eye: No discharge.     Extraocular Movements: Extraocular movements intact.  Cardiovascular:     Rate and Rhythm: Normal rate.  Pulmonary:     Effort: Pulmonary effort is normal.  Musculoskeletal:     Right ankle: Swelling and ecchymosis present. No deformity or lacerations. Tenderness present over the lateral malleolus, ATF ligament, AITF ligament and base of 5th metatarsal. No medial malleolus, CF ligament, posterior TF ligament or proximal fibula tenderness. Normal range of motion. Normal pulse.     Right Achilles Tendon: No tenderness or defects. Thompson's test negative.     Right foot: Decreased range of motion. Normal capillary refill. Swelling, tenderness and bony  tenderness present. No deformity, laceration or crepitus.       Legs:  Skin:    General: Skin is warm and dry.  Neurological:     General: No focal deficit present.     Mental Status: She is alert and oriented to person, place, and time.  Psychiatric:        Mood and Affect: Mood normal.        Behavior: Behavior normal.     DG Ankle Complete Right  Result Date: 01/31/2022 CLINICAL DATA:  Trauma, pain EXAM: RIGHT ANKLE - COMPLETE 3+ VIEW COMPARISON:  None Available. FINDINGS: No fracture or dislocation is seen. There is soft tissue swelling over the lateral malleolus. IMPRESSION: No recent fracture or dislocation is seen in right ankle. Electronically Signed   By: Ernie Avena M.D.   On: 01/31/2022 14:08   DG Foot Complete Right  Result Date: 01/31/2022 CLINICAL DATA:  Trauma, pain EXAM: RIGHT FOOT COMPLETE - 3+ VIEW COMPARISON:  None Available. FINDINGS: No displaced fracture or dislocation is seen. There is marked soft tissue swelling over the dorsum. There is small sclerotic density in the middle phalanx of middle toe, possibly bone island. Possible minimal bony spurs seen in first metatarsophalangeal joint. IMPRESSION: No recent fracture or dislocation is seen in right foot. Electronically Signed   By: Ernie Avena M.D.   On: 01/31/2022 13:44    A 4" Ace wrap was applied to the right foot and ankle.  Assessment and Plan :   PDMP not reviewed this encounter.  1. Right foot sprain, initial encounter   2. Sprain of right ankle, unspecified ligament, initial encounter   3. Encounter for medication refill   4. RLS (restless legs syndrome)     Will manage for ankle and foot sprain with rice method, NSAID. Counseled patient on potential for adverse effects with medications prescribed/recommended today, ER and return-to-clinic precautions discussed, patient verbalized understanding.  At discharge, patient requested medication refill for gabapentin.  I provided this to  her.  She is currently in the process of trying to find a new PCP.    Wallis Bamberg, PA-C 01/31/22 1807

## 2022-01-31 NOTE — ED Triage Notes (Signed)
Pt states that she fell and injured her right foot. X1 week

## 2022-02-01 DIAGNOSIS — F411 Generalized anxiety disorder: Secondary | ICD-10-CM | POA: Diagnosis not present

## 2022-02-01 DIAGNOSIS — F4312 Post-traumatic stress disorder, chronic: Secondary | ICD-10-CM | POA: Diagnosis not present

## 2022-02-22 DIAGNOSIS — F411 Generalized anxiety disorder: Secondary | ICD-10-CM | POA: Diagnosis not present

## 2022-02-22 DIAGNOSIS — F4312 Post-traumatic stress disorder, chronic: Secondary | ICD-10-CM | POA: Diagnosis not present

## 2022-03-08 DIAGNOSIS — F411 Generalized anxiety disorder: Secondary | ICD-10-CM | POA: Diagnosis not present

## 2022-03-08 DIAGNOSIS — F4312 Post-traumatic stress disorder, chronic: Secondary | ICD-10-CM | POA: Diagnosis not present

## 2022-03-15 ENCOUNTER — Other Ambulatory Visit: Payer: Self-pay | Admitting: Cardiovascular Disease

## 2022-03-22 DIAGNOSIS — F4312 Post-traumatic stress disorder, chronic: Secondary | ICD-10-CM | POA: Diagnosis not present

## 2022-03-22 DIAGNOSIS — F411 Generalized anxiety disorder: Secondary | ICD-10-CM | POA: Diagnosis not present

## 2022-03-24 ENCOUNTER — Telehealth: Payer: BC Managed Care – PPO | Admitting: Urgent Care

## 2022-03-24 DIAGNOSIS — H539 Unspecified visual disturbance: Secondary | ICD-10-CM | POA: Diagnosis not present

## 2022-03-24 DIAGNOSIS — R519 Headache, unspecified: Secondary | ICD-10-CM | POA: Diagnosis not present

## 2022-03-24 DIAGNOSIS — R42 Dizziness and giddiness: Secondary | ICD-10-CM | POA: Diagnosis not present

## 2022-03-24 NOTE — Patient Instructions (Signed)
  Adria Dill, thank you for joining Chaney Malling, PA for today's virtual visit.  While this provider is not your primary care provider (PCP), if your PCP is located in our provider database this encounter information will be shared with them immediately following your visit.   [facility] account gives you access to today's visit and all your visits, tests, and labs performed at [facility] " click here if you don't have a [facility] account or go to [email or web address]  Consent: (Patient) Adria Dill provided verbal consent for this virtual visit at the beginning of the encounter.  Current Medications:  Current Outpatient Medications:    albuterol (VENTOLIN HFA) 108 (90 Base) MCG/ACT inhaler, Inhale 2 puffs into the lungs every 6 (six) hours as needed for wheezing or shortness of breath., Disp: 8 g, Rfl: 0   gabapentin (NEURONTIN) 100 MG capsule, Take 1 capsule (100 mg total) by mouth at bedtime., Disp: 90 capsule, Rfl: 0   meclizine (ANTIVERT) 25 MG tablet, Take 1 tablet (25 mg total) by mouth 3 (three) times daily as needed for dizziness., Disp: 30 tablet, Rfl: 0   naproxen (NAPROSYN) 500 MG tablet, Take 1 tablet (500 mg total) by mouth 2 (two) times daily with a meal., Disp: 30 tablet, Rfl: 0   Norethindrone-Ethinyl Estradiol-Fe Biphas (LO LOESTRIN FE) 1 MG-10 MCG / 10 MCG tablet, Take 1 tablet by mouth daily., Disp: 28 tablet, Rfl: 11   Medications ordered in this encounter:  No orders of the defined types were placed in this encounter.    *If you need refills on other medications prior to your next appointment, please contact your pharmacy*  Follow-Up: Call back or seek an in-person evaluation if the symptoms worsen or if the condition fails to improve as anticipated.  [facility] [phone]  Other Instructions You symptoms warrant an Emergency Room visit. You need an emergent CT scan of your head.  Please have your  husband drive you to the nearest ER for further workup. Failure to seek ER evaluation could result in detrimental health outcomes.   If you have been instructed to have an in-person evaluation today at a local Urgent Care facility, please use the link below. It will take you to a list of all of our available [facility], including address, phone number and hours of operation. Please do not delay care.  [facility]  If you or a family member do not have a primary care provider, use the link below to schedule a visit and establish care. When you choose a [facility] primary care physician or advanced practice provider, you gain a long-term partner in health. Find a Primary Care Provider  Learn more about 's in-office and virtual care options: [facility]

## 2022-03-24 NOTE — Progress Notes (Signed)
Virtual Visit Consent   Kathryn Brown, you are scheduled for a virtual visit with a [facility] provider today. Just as with appointments in the office, your consent must be obtained to participate. Your consent will be active for this visit and any virtual visit you may have with one of our providers in the next 365 days. If you have a MyChart account, a copy of this consent can be sent to you electronically.  As this is a virtual visit, video technology does not allow for your provider to perform a traditional examination. This may limit your provider's ability to fully assess your condition. If your provider identifies any concerns that need to be evaluated in person or the need to arrange testing (such as labs, EKG, etc.), we will make arrangements to do so. Although advances in technology are sophisticated, we cannot ensure that it will always work on either your end or our end. If the connection with a video visit is poor, the visit may have to be switched to a telephone visit. With either a video or telephone visit, we are not always able to ensure that we have a secure connection.  By engaging in this virtual visit, you consent to the provision of healthcare and authorize for your insurance to be billed (if applicable) for the services provided during this visit. Depending on your insurance coverage, you may receive a charge related to this service.  I need to obtain your verbal consent now. Are you willing to proceed with your visit today? Kathryn Brown has provided verbal consent on 03/24/2022 for a virtual visit (video or telephone). Chaney Malling, PA  Date: 03/24/2022 5:44 PM  Virtual Visit via Video Note   I, Kathryn Brown, connected with  Kathryn Brown  ([id number], Mar 25, 1975) on 03/24/22 at  5:30 PM EST by a video-enabled telemedicine application and verified that I am speaking with the correct person using two identifiers.  Location: Patient: Virtual Visit Location Patient:  Home Provider: Virtual Visit Location Provider: Home Office   I discussed the limitations of evaluation and management by telemedicine and the availability of in person appointments. The patient expressed understanding and agreed to proceed.    History of Present Illness: Kathryn Brown is a 47 y.o. who identifies as a female who was assigned female at birth, and is being seen today for head pressure and change in vision.  HPI: 47 yo female presents today with concern of brain fog, pressure on top of head, temporal vision loss, and dizziness. Pt states that one and a half to two weeks ago, she had very severe stress. States the stress has calmed down, but over the past few days she has developed a pressure on the top of her head. It is not located in the sinus region. Feels like pressure to the parietal lobe bilaterally. She also states that she is having tunnel vision. States her peripheral vision is blurred and dark with possible "halos." She feels dizzy. When asked about any change in gait, she states "I'm getting bye." Pt also endorses difficulty concentrating and thinking but no slurred speech.   Problems:  Patient Active Problem List   Diagnosis Date Noted   Abnormal uterine bleeding (AUB) 09/21/2021   History of loop electrical excision procedure (LEEP) 09/21/2021   Vitamin D deficiency 08/26/2019   Upper airway cough syndrome 08/06/2017   DOE (dyspnea on exertion) 08/04/2017   MVP (mitral valve prolapse) 10/30/2011    Allergies:  Allergies  Allergen Reactions   Sulfa Antibiotics Anaphylaxis   Medications:  Current Outpatient Medications:    albuterol (VENTOLIN HFA) 108 (90 Base) MCG/ACT inhaler, Inhale 2 puffs into the lungs every 6 (six) hours as needed for wheezing or shortness of breath., Disp: 8 g, Rfl: 0   gabapentin (NEURONTIN) 100 MG capsule, Take 1 capsule (100 mg total) by mouth at bedtime., Disp: 90 capsule, Rfl: 0   meclizine (ANTIVERT) 25 MG tablet, Take 1 tablet (25  mg total) by mouth 3 (three) times daily as needed for dizziness., Disp: 30 tablet, Rfl: 0   naproxen (NAPROSYN) 500 MG tablet, Take 1 tablet (500 mg total) by mouth 2 (two) times daily with a meal., Disp: 30 tablet, Rfl: 0   Norethindrone-Ethinyl Estradiol-Fe Biphas (LO LOESTRIN FE) 1 MG-10 MCG / 10 MCG tablet, Take 1 tablet by mouth daily., Disp: 28 tablet, Rfl: 11  Observations/Objective: Patient is well-developed, well-nourished in no acute distress.  Resting comfortably sitting upright at home.  Head is normocephalic, atraumatic.  No labored breathing.  Speech is clear and coherent with logical content.  Patient is alert and oriented at baseline.    Assessment and Plan: 1. Pressure in head  2. Dizziness  3. Vision changes  Pt has numerous neurological symptoms that warrant further workup in the ER. Pt needs labs and a CT scan. Pt is on OCPs and I am also concerned about possibility of blood clot. Husband was nearby and understands the importance of driving patient to ER immediately for further workup to include CT scan and labs. Pt aware that this is not a condition that can be safely treated via telehealth technology. Both in agreement to seek ER workup.  Follow Up Instructions: I discussed the assessment and treatment plan with the patient. The patient was provided an opportunity to ask questions and all were answered. The patient agreed with the plan and demonstrated an understanding of the instructions.  A copy of instructions were sent to the patient via MyChart unless otherwise noted below.    The patient was advised to call back or seek an in-person evaluation if the symptoms worsen or if the condition fails to improve as anticipated.  Time:  I spent 8 minutes with the patient via telehealth technology discussing the above problems/concerns.    [place], PA

## 2022-03-26 ENCOUNTER — Emergency Department (HOSPITAL_BASED_OUTPATIENT_CLINIC_OR_DEPARTMENT_OTHER)
Admission: EM | Admit: 2022-03-26 | Discharge: 2022-03-26 | Disposition: A | Discharge status: Home / Self Care | Payer: BC Managed Care – PPO | Attending: Emergency Medicine | Admitting: Emergency Medicine

## 2022-03-26 ENCOUNTER — Other Ambulatory Visit: Payer: Self-pay

## 2022-03-26 ENCOUNTER — Emergency Department (HOSPITAL_BASED_OUTPATIENT_CLINIC_OR_DEPARTMENT_OTHER): Payer: BC Managed Care – PPO

## 2022-03-26 ENCOUNTER — Encounter (HOSPITAL_BASED_OUTPATIENT_CLINIC_OR_DEPARTMENT_OTHER): Payer: Self-pay | Admitting: Emergency Medicine

## 2022-03-26 DIAGNOSIS — H539 Unspecified visual disturbance: Secondary | ICD-10-CM

## 2022-03-26 DIAGNOSIS — R42 Dizziness and giddiness: Secondary | ICD-10-CM | POA: Diagnosis not present

## 2022-03-26 DIAGNOSIS — H53121 Transient visual loss, right eye: Secondary | ICD-10-CM | POA: Diagnosis not present

## 2022-03-26 DIAGNOSIS — R200 Anesthesia of skin: Secondary | ICD-10-CM | POA: Diagnosis not present

## 2022-03-26 DIAGNOSIS — R519 Headache, unspecified: Secondary | ICD-10-CM

## 2022-03-26 DIAGNOSIS — H538 Other visual disturbances: Secondary | ICD-10-CM | POA: Diagnosis not present

## 2022-03-26 DIAGNOSIS — R479 Unspecified speech disturbances: Secondary | ICD-10-CM

## 2022-03-26 LAB — CBC WITH DIFFERENTIAL/PLATELET
Abs Immature Granulocytes: 0.03 10*3/uL (ref 0.00–0.07)
Basophils Absolute: 0.1 10*3/uL (ref 0.0–0.1)
Basophils Relative: 1 %
Eosinophils Absolute: 0.1 10*3/uL (ref 0.0–0.5)
Eosinophils Relative: 2 %
HCT: 37.2 % (ref 36.0–46.0)
Hemoglobin: 12.2 g/dL (ref 12.0–15.0)
Immature Granulocytes: 0 %
Lymphocytes Relative: 20 %
Lymphs Abs: 1.6 10*3/uL (ref 0.7–4.0)
MCH: 29.8 pg (ref 26.0–34.0)
MCHC: 32.8 g/dL (ref 30.0–36.0)
MCV: 90.7 fL (ref 80.0–100.0)
Monocytes Absolute: 0.6 10*3/uL (ref 0.1–1.0)
Monocytes Relative: 7 %
Neutro Abs: 5.8 10*3/uL (ref 1.7–7.7)
Neutrophils Relative %: 70 %
Platelets: 248 10*3/uL (ref 150–400)
RBC: 4.1 MIL/uL (ref 3.87–5.11)
RDW: 13.2 % (ref 11.5–15.5)
WBC: 8.2 10*3/uL (ref 4.0–10.5)
nRBC: 0 % (ref 0.0–0.2)

## 2022-03-26 LAB — COMPREHENSIVE METABOLIC PANEL
ALT: 16 U/L (ref 0–44)
AST: 10 U/L — ABNORMAL LOW (ref 15–41)
Albumin: 3.8 g/dL (ref 3.5–5.0)
Alkaline Phosphatase: 45 U/L (ref 38–126)
Anion gap: 7 (ref 5–15)
BUN: 15 mg/dL (ref 6–20)
CO2: 25 mmol/L (ref 22–32)
Calcium: 9 mg/dL (ref 8.9–10.3)
Chloride: 107 mmol/L (ref 98–111)
Creatinine, Ser: 0.79 mg/dL (ref 0.44–1.00)
GFR, Estimated: >60 mL/min (ref >60)
Glucose, Bld: 83 mg/dL (ref 70–99)
Potassium: 3.6 mmol/L (ref 3.5–5.1)
Sodium: 139 mmol/L (ref 135–145)
Total Bilirubin: 0.3 mg/dL (ref 0.3–1.2)
Total Protein: 6.3 g/dL — ABNORMAL LOW (ref 6.5–8.1)

## 2022-03-26 LAB — TSH: TSH: 0.603 u[IU]/mL (ref 0.350–4.500)

## 2022-03-26 LAB — HCG, SERUM, QUALITATIVE: Preg, Serum: NEGATIVE

## 2022-03-26 LAB — AMMONIA: Ammonia: 45 umol/L — ABNORMAL HIGH (ref 9–35)

## 2022-03-26 MED ORDER — SODIUM CHLORIDE 0.9 % IV BOLUS
1000.0000 mL | Freq: Once | INTRAVENOUS | Status: AC
  Administered 2022-03-26: 1000 mL via INTRAVENOUS

## 2022-03-26 MED ORDER — IOHEXOL 350 MG/ML SOLN
100.0000 mL | Freq: Once | INTRAVENOUS | Status: AC | PRN
  Administered 2022-03-26: 100 mL via INTRAVENOUS

## 2022-03-26 MED ORDER — DIPHENHYDRAMINE HCL 50 MG/ML IJ SOLN
50.0000 mg | Freq: Once | INTRAMUSCULAR | Status: AC
  Administered 2022-03-26: 50 mg via INTRAVENOUS
  Filled 2022-03-26: qty 1

## 2022-03-26 MED ORDER — KETOROLAC TROMETHAMINE 15 MG/ML IJ SOLN
15.0000 mg | Freq: Once | INTRAMUSCULAR | Status: AC
  Administered 2022-03-26: 15 mg via INTRAVENOUS
  Filled 2022-03-26: qty 1

## 2022-03-26 MED ORDER — PROCHLORPERAZINE EDISYLATE 10 MG/2ML IJ SOLN
10.0000 mg | Freq: Once | INTRAMUSCULAR | Status: AC
  Administered 2022-03-26: 10 mg via INTRAVENOUS
  Filled 2022-03-26: qty 2

## 2022-03-26 NOTE — ED Triage Notes (Signed)
Confused,word finding problems

## 2022-03-26 NOTE — Discharge Instructions (Signed)
Your history, exam, workup today led [place] to get imaging of your head and neck that were overall reassuring.   we spoke to neurology after your workup was completed and they suspect what is called a complex or complicated migraine causing the transient neurologic symptoms in the setting of your headaches.  After the medications, your headache significant improved as did your symptoms.  We had a shared decision-making conversation offering transfer for MRI however given your improvement in symptoms we feel it is reasonable to follow-up with outpatient neurology instead of transfer and MRI tonight.  Please rest and stay hydrated and follow-up with the neurology team and your PCP.  If any symptoms change or worsen acutely, please return to the nearest emergency department.

## 2022-03-26 NOTE — ED Notes (Signed)
After CT scan states HA got worse and feels more dizzy  Will continue to monitor

## 2022-03-26 NOTE — ED Triage Notes (Signed)
Past week or two she has been having fuzzy vision intermittent, brain fog, pain on top of head, pressure top of head and towards forehead, memory problems.

## 2022-03-26 NOTE — ED Provider Notes (Signed)
[facility] Provider Note   CSN: [id number] Arrival date & time: 03/26/22  1324     History  No chief complaint on file.   Kathryn Brown is a 47 y.o. female.  The history is provided by the patient, medical records and a relative. No language interpreter was used.  Neurologic Problem This is a new problem. The current episode started more than 1 week ago. The problem occurs constantly. The problem has not changed since onset.Associated symptoms include headaches. Pertinent negatives include no chest pain, no abdominal pain and no shortness of breath. Nothing aggravates the symptoms. Nothing relieves the symptoms. She has tried nothing for the symptoms. The treatment provided no relief.       Home Medications Prior to Admission medications   Medication Sig Start Date End Date Taking? Authorizing Provider  albuterol (VENTOLIN HFA) 108 (90 Base) MCG/ACT inhaler Inhale 2 puffs into the lungs every 6 (six) hours as needed for wheezing or shortness of breath. 01/15/22   Hassell Done, Mary-Margaret, FNP  gabapentin (NEURONTIN) 100 MG capsule Take 1 capsule (100 mg total) by mouth at bedtime. 01/31/22   Jaynee Eagles, PA-C  meclizine (ANTIVERT) 25 MG tablet Take 1 tablet (25 mg total) by mouth 3 (three) times daily as needed for dizziness. 01/15/22   Chevis Pretty, FNP  naproxen (NAPROSYN) 500 MG tablet Take 1 tablet (500 mg total) by mouth 2 (two) times daily with a meal. 01/31/22   Jaynee Eagles, PA-C  Norethindrone-Ethinyl Estradiol-Fe Biphas (LO LOESTRIN FE) 1 MG-10 MCG / 10 MCG tablet Take 1 tablet by mouth daily. 09/21/21   Leftwich-Kirby, Kathie Dike, CNM      Allergies    Sulfa antibiotics    Review of Systems   Review of Systems  Constitutional:  Positive for fatigue. Negative for chills and fever.  HENT:  Negative for congestion.   Eyes:  Positive for visual disturbance (resolved now). Negative for photophobia and pain.  Respiratory:   Negative for cough, chest tightness, shortness of breath and wheezing.   Cardiovascular:  Negative for chest pain, palpitations and leg swelling.  Gastrointestinal:  Negative for abdominal pain, constipation, diarrhea, nausea and vomiting.  Genitourinary:  Negative for dysuria and flank pain.  Musculoskeletal:  Negative for back pain, neck pain and neck stiffness.  Skin:  Negative for rash and wound.  Neurological:  Positive for dizziness, speech difficulty, light-headedness, numbness and headaches. Negative for tremors, seizures, facial asymmetry and weakness.  Psychiatric/Behavioral:  Positive for confusion. Negative for agitation.   All other systems reviewed and are negative.   Physical Exam Updated Vital Signs BP 118/74   Pulse 73   Temp 98.6 F (37 C) (Oral)   Resp 20   SpO2 99%  Physical Exam Vitals and nursing note reviewed.  Constitutional:      General: She is not in acute distress.    Appearance: She is well-developed. She is not ill-appearing, toxic-appearing or diaphoretic.  HENT:     Head: Normocephalic and atraumatic.     Nose: No congestion or rhinorrhea.     Mouth/Throat:     Mouth: Mucous membranes are moist.     Pharynx: No oropharyngeal exudate or posterior oropharyngeal erythema.  Eyes:     Extraocular Movements: Extraocular movements intact.     Conjunctiva/sclera: Conjunctivae normal.     Pupils: Pupils are equal, round, and reactive to light.  Neck:     Vascular: No carotid bruit.  Cardiovascular:  Rate and Rhythm: Normal rate and regular rhythm.     Heart sounds: No murmur heard. Pulmonary:     Effort: Pulmonary effort is normal. No respiratory distress.     Breath sounds: Normal breath sounds. No wheezing, rhonchi or rales.  Chest:     Chest wall: No tenderness.  Abdominal:     General: Abdomen is flat.     Palpations: Abdomen is soft.     Tenderness: There is no abdominal tenderness. There is no right CVA tenderness, left CVA tenderness,  guarding or rebound.  Musculoskeletal:        General: No swelling or tenderness.     Cervical back: Neck supple. No tenderness.     Right lower leg: No edema.  Skin:    General: Skin is warm and dry.     Capillary Refill: Capillary refill takes less than 2 seconds.     Findings: No erythema or rash.  Neurological:     Mental Status: She is alert.     Sensory: Sensory deficit present.     Motor: No weakness.  Psychiatric:        Mood and Affect: Mood normal.     ED Results / Procedures / Treatments   Labs (all labs ordered are listed, but only abnormal results are displayed) Labs Reviewed  COMPREHENSIVE METABOLIC PANEL - Abnormal; Notable for the following components:      Result Value   Total Protein 6.3 (*)    AST 10 (*)    All other components within normal limits  AMMONIA - Abnormal; Notable for the following components:   Ammonia 45 (*)    All other components within normal limits  CBC WITH DIFFERENTIAL/PLATELET  TSH  HCG, SERUM, QUALITATIVE  URINALYSIS, ROUTINE W REFLEX MICROSCOPIC    EKG EKG Interpretation  Date/Time:  Saturday March 26 2022 15:27:59 EST Ventricular Rate:  63 PR Interval:  166 QRS Duration: 91 QT Interval:  396 QTC Calculation: 406 R Axis:   80 Text Interpretation: Sinus rhythm RSR' in V1 or V2, right VCD or RVH when compared to prior, overall similar appearance. No STEMI Confirmed by Antony Blackbird [phone]) on 03/26/2022 4:09:29 PM  Radiology CT ANGIO HEAD NECK W WO CM  Result Date: 03/26/2022 CLINICAL DATA:  Sudden onset of severe headache and speech problems. Dizziness. Right arm and face numbness. Transient vision loss of the right eye. EXAM: CT ANGIOGRAPHY HEAD AND NECK TECHNIQUE: Multidetector CT imaging of the head and neck was performed using the standard protocol during bolus administration of intravenous contrast. Multiplanar CT image reconstructions and MIPs were obtained to evaluate the vascular anatomy. Carotid stenosis  measurements (when applicable) are obtained utilizing NASCET criteria, using the distal internal carotid diameter as the denominator. RADIATION DOSE REDUCTION: This exam was performed according to the departmental dose-optimization program which includes automated exposure control, adjustment of the mA and/or kV according to patient size and/or use of iterative reconstruction technique. CONTRAST:  161mL OMNIPAQUE IOHEXOL 350 MG/ML SOLN COMPARISON:  MR head without contrast 07/28/2016 FINDINGS: CT HEAD FINDINGS Brain: No acute infarct, hemorrhage, or mass lesion is present. No significant white matter lesions are present. Deep brain nuclei are within normal limits. The ventricles are of normal size. No significant extraaxial fluid collection is present. The brainstem and cerebellum are within normal limits. Midline structures are within normal limits. Vascular: No hyperdense vessel or unexpected calcification. Skull: Calvarium is intact. No focal lytic or blastic lesions are present. No significant extracranial soft tissue lesion  is present. Sinuses/Orbits: The paranasal sinuses and mastoid air cells are clear. The globes and orbits are within normal limits. Review of the MIP images confirms the above findings CTA NECK FINDINGS Aortic arch: The contrast bolus is delayed. Significant venous contrast obscures the proximal great vessels. Visualized aortic arch is within normal limits. Right carotid system: The right common carotid artery is within normal limits. Bifurcation is normal. Cervical right ICA is normal. Left carotid system: The left common carotid artery is within normal limits. Bifurcation is unremarkable. Cervical left ICA is normal. Vertebral arteries: The left vertebral artery is the dominant vessel. Both vertebral arteries originate from the subclavian arteries without significant stenoses. No significant stenosis is present in either vertebral artery in the neck. Skeleton: Vertebral body heights and  alignment are normal. No acute or focal osseous lesions are present. Other neck: Soft tissues the neck are otherwise unremarkable. Salivary glands are within normal limits. Thyroid is normal. No significant adenopathy is present. No focal mucosal or submucosal lesions are present. Upper chest: Mild dependent atelectasis is present. The lungs are otherwise clear. Review of the MIP images confirms the above findings CTA HEAD FINDINGS Anterior circulation: The internal carotid arteries are within normal limits through the ICA termini bilaterally. The A1 and M1 segments are normal. The anterior communicating artery is patent. The MCA bifurcations are within normal limits bilaterally. There is some attenuation of distal ACA and MCA branch vessels which is likely due to timing of the contrast bolus. No significant proximal stenosis or occlusion is present. Posterior circulation: Left PICA origin is visualized and normal. AICA vessels are present bilaterally. The vertebrobasilar junction basilar artery normal. Both posterior cerebral arteries originate from basilar tip. Similar attenuation of distal PCA branch vessels is present. Proximal PCA vessels are normal. Venous sinuses: Dural sinuses are not well visualized due to the early timing of the contrast bolus. A separate CT venogram was performed. Anatomic variants: None Review of the MIP images confirms the above findings IMPRESSION: 1. Normal noncontrast CT of the head. 2. Normal CTA of the neck. 3. Normal CTA circle-of-Willis without significant proximal stenosis, aneurysm, or branch vessel occlusion. 4. The study is somewhat degraded in the evaluation of distal vessels due to timing of the contrast bolus. Electronically Signed   By: Marin Roberts M.D.   On: 03/26/2022 18:21   CT VENOGRAM HEAD  Result Date: 03/26/2022 CLINICAL DATA:  Dural venous sinus thrombosis suspected. Episodes of abnormal vision, brain fog and pressure at top of head towards forehead  as well as memory problems. EXAM: CT VENOGRAM HEAD TECHNIQUE: Venographic phase images of the brain were obtained following the administration of intravenous contrast. Multiplanar reformats and maximum intensity projections were generated. RADIATION DOSE REDUCTION: This exam was performed according to the departmental dose-optimization program which includes automated exposure control, adjustment of the mA and/or kV according to patient size and/or use of iterative reconstruction technique. CONTRAST:  OMNIPAQUE IOHEXOL 350 MG/ML SOLN COMPARISON:  MR head 07/28/2016 FINDINGS: Delayed postcontrast images demonstrate normal opacification of the dural sinuses. The right transverse sinus is dominant. Straight sinus and deep cerebral veins are patent. Sigmoid sinus and internal jugular veins are patent. Cortical veins are unremarkable. IMPRESSION: No evidence of dural venous sinus thrombosis. Electronically Signed   By: Marin Roberts M.D.   On: 03/26/2022 18:09    Procedures Procedures    Medications Ordered in ED Medications  iohexol (OMNIPAQUE) 350 MG/ML injection 100 mL (100 mLs Intravenous Contrast Given 03/26/22 1658)  sodium chloride 0.9 % bolus 1,000 mL (0 mLs Intravenous Stopped 03/26/22 2030)  prochlorperazine (COMPAZINE) injection 10 mg (10 mg Intravenous Given 03/26/22 1936)  diphenhydrAMINE (BENADRYL) injection 50 mg (50 mg Intravenous Given 03/26/22 1935)  ketorolac (TORADOL) 15 MG/ML injection 15 mg (15 mg Intravenous Given 03/26/22 1936)    ED Course/ Medical Decision Making/ A&P                             Medical Decision Making Amount and/or Complexity of Data Reviewed Labs: ordered. Radiology: ordered.  Risk Prescription drug management.    Kathryn Brown is a 47 y.o. female with a past medical history significant for mitral valve prolapse and previous stomach ulcers who presents for 2 weeks of headache and neurologic complaints.  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  She reports has had waxing waning severe headache that comes and goes but has never been gone.  She reports having dizziness with feeling like her head is spinning.  She also feels lightheaded and near syncopal.  She reports her right eyes vision completely went away for several hours recently and then came back.  She reports she has had some slow speech and feels like she slightly aphasic that has been going on for several days.  She reports she is having numbness in her right face and right arm for the last day or 2.  She reports she is having headache on the front and top of her head that is been going on for the last 2 weeks and is not so but he gets long history of headaches.  She denies any photophobia does have some phonophobia.  She denies any new trauma but had a fall down some stairs in the past.  She denies any nausea, vomiting, constipation, diarrhea, or urinary changes.  Denies fevers chills congestion, or cough.  Denies neck pain or neck stiffness.  Denies history of strokes.  On my exam, lungs were clear and chest was nontender.  Abdomen is nontender.  Normal bowel sounds.  On my exam, she does have numbness in her right face and right arm compared to left.  She had intact sensation and strength in bilateral legs.  Intact strength and upper arms.  Symmetric smile.  Speech was slow but I do not appreciate any dysarthria.  Patient felt that she was slightly aphasic.  Pupils are symmetric and reactive and had intact and extract movements although this made her headache worse.  She had no carotid bruit on my exam and could move her neck around on exam.  Back was nontender.  Flanks nontender.  Good pulses in extremities.  Given patient's report of nearly 2 weeks of atypical and prolonged headache with intermittent neurologic complaints, I do feel need to get imaging to rule out a  dural venous sinus thrombus.  Due to the dizziness and vision changes I do feel that CTA is important as well to rule out a vertebrobasilar problem.  Will get CTA head and neck.  Will get screening labs.  If workup is complete reassuring, dissipate discussion with neurology for possible MRI with ED to ED transfer.  Anticipate reassessment after workup to determine disposition.      7:18 PM CTA and CTV returned overall reassuring.  Labs overall reassuring.  Due to the neurologic symptoms and  headache I called Dr. Amada Jupiter with on-call neurology who reviewed the case.  He feels that the patient likely is having a complex migraine and recommended a headache cocktail.  Patient is amenable to this plan.  He said that if symptoms do not improve or we were more concerned it would be reasonable to transfer ED to ED to get MRI with and without contrast of the brain to look for stroke, MS, or other acute abnormality.  I spoke to patient and family and they would like to try the headache cocktail and reassess.  They would like to go home tonight.  Anticipate reassessment after medications.  If she is starting feel better, dissipate discharge with outpatient neurology follow-up.  9:47 PM Patient reassessed after headache cocktail and she has complete resolution of symptoms.  No speech changes, numbness, or headache.  It is all better.  Her restless leg has slightly worsened and stay on the Compazine but otherwise she is doing well.  Patient agrees with discharge home and will follow-up with outpatient neurology.  Will give number for neurologist group in town.  Patient and family great plan of care and patient discharged in good condition with improved symptoms after reassuring workup otherwise.          Final Clinical Impression(s) / ED Diagnoses Final diagnoses:  Acute nonintractable headache, unspecified headache type  Transient vision disturbance of right eye  Transient speech disturbance   Numbness on right side    Rx / DC Orders ED Discharge Orders     None       Clinical Impression: 1. Acute nonintractable headache, unspecified headache type   2. Transient vision disturbance of right eye   3. Transient speech disturbance   4. Numbness on right side     Disposition: Discharge  Condition: Good  I have discussed the results, Dx and Tx plan with the pt(& family if present). He/she/they expressed understanding and agree(s) with the plan. Discharge instructions discussed at great length. Strict return precautions discussed and pt &/or family have verbalized understanding of the instructions. No further questions at time of discharge.    New Prescriptions   No medications on file    Follow Up: [facility] [address]     [address] [place] [address] [phone]    Janeece Agee, NP [address] [place] [address] [phone]     [facility] Emergency Department at [facility] [address] [place] [address] [phone]       Karinna Beadles, Canary Brim, MD 03/26/22 2150

## 2022-03-26 NOTE — ED Triage Notes (Signed)
Pt has had loss of her company and feels like maybe stress

## 2022-03-29 ENCOUNTER — Ambulatory Visit: Payer: BC Managed Care – PPO | Admitting: Neurology

## 2022-03-29 ENCOUNTER — Encounter: Payer: Self-pay | Admitting: Neurology

## 2022-03-29 ENCOUNTER — Other Ambulatory Visit: Payer: Self-pay | Admitting: Neurology

## 2022-03-29 VITALS — BP 106/70 | HR 74 | Ht 68.0 in | Wt 175.0 lb

## 2022-03-29 DIAGNOSIS — R200 Anesthesia of skin: Secondary | ICD-10-CM

## 2022-03-29 DIAGNOSIS — G43709 Chronic migraine without aura, not intractable, without status migrainosus: Secondary | ICD-10-CM | POA: Diagnosis not present

## 2022-03-29 DIAGNOSIS — G4719 Other hypersomnia: Secondary | ICD-10-CM

## 2022-03-29 DIAGNOSIS — R0683 Snoring: Secondary | ICD-10-CM

## 2022-03-29 DIAGNOSIS — R531 Weakness: Secondary | ICD-10-CM

## 2022-03-29 DIAGNOSIS — G459 Transient cerebral ischemic attack, unspecified: Secondary | ICD-10-CM

## 2022-03-29 DIAGNOSIS — R4182 Altered mental status, unspecified: Secondary | ICD-10-CM

## 2022-03-29 DIAGNOSIS — G43E09 Chronic migraine with aura, not intractable, without status migrainosus: Secondary | ICD-10-CM

## 2022-03-29 DIAGNOSIS — R42 Dizziness and giddiness: Secondary | ICD-10-CM

## 2022-03-29 DIAGNOSIS — G2581 Restless legs syndrome: Secondary | ICD-10-CM

## 2022-03-29 DIAGNOSIS — R002 Palpitations: Secondary | ICD-10-CM

## 2022-03-29 DIAGNOSIS — F4312 Post-traumatic stress disorder, chronic: Secondary | ICD-10-CM | POA: Diagnosis not present

## 2022-03-29 DIAGNOSIS — F411 Generalized anxiety disorder: Secondary | ICD-10-CM | POA: Diagnosis not present

## 2022-03-29 MED ORDER — TOPIRAMATE 50 MG PO TABS: 100.0000 mg | ORAL_TABLET | Freq: Two times a day (BID) | ORAL | 11 refills | Status: DC

## 2022-03-29 MED ORDER — ASPIRIN 81 MG PO CHEW: 81.0000 mg | CHEWABLE_TABLET | Freq: Every day | ORAL | Status: DC

## 2022-03-29 NOTE — Patient Instructions (Addendum)
Need stroke/TIA evaluation: Mri of the brain w/wo contrast - need to see if you've had a stroke May need to further examine PFO found in the past if you have had a stroke there may indication to close it - may need Trans Cranial Dopplers which show [place] if PFO lets bubbles through Test atrial fibrillation - 30 day heart monitor and if negative loop recorder Sleep apnea testing? Will take naps on weekends, snore, tired during the day, recent possible TIA/Stroke needs sleep evaluation also has very bad RLS and kicking all night - appt sleep doctor Echo with bubble study - ex[place] heart Hgba1c and cholesterol panel Blood work Follow up 2 months Start Topiramate as migraine prevention and need to discuss other meds and emergency medication  EEG - schedule up front  Differential: TIA vs stroke, Migraine with aura   Treatment migraines: Preventative and emergency medication. Jorene Guest, Qulipta, Nurtec, [place] great new class of migraine medications with very few side effects and very effective Try migraine preventative: Topiramate   There is increased risk for stroke in women with migraine with aura and a contraindication for the combined contraceptive pill for use by women who have migraine with aura. The risk for women with migraine without aura is lower. However other risk factors like smoking are far more likely to increase stroke risk than migraine. There is a recommendation for no smoking and for the use of OCPs without estrogen such as progestogen only pills particularly for women with migraine with aura.Marland Kitchen People who have migraine headaches with auras may be 3 times more likely to have a stroke caused by a blood clot, compared to migraine patients who don't see auras. Women who take hormone-replacement therapy may be 30 percent more likely to suffer a clot-based stroke than women not taking medication containing estrogen. Other risk factors like smoking and high blood pressure may be  much  more important.  Topiramate Tablets What is this medication? TOPIRAMATE (toe PYRE a mate) prevents and controls seizures in people with epilepsy. It may also be used to prevent migraine headaches. It works by calming overactive nerves in your body. This medicine may be used for other purposes; ask your health care provider or pharmacist if you have questions. COMMON BRAND NAME(S): Topamax, Topiragen What should I tell my care team before I take this medication? They need to know if you have any of these conditions: Bleeding disorder Kidney disease Lung disease Suicidal thoughts, plans, or attempt by you or a family member An unusual or allergic reaction to topiramate, other medications, foods, dyes, or preservatives Pregnant or trying to get pregnant Breast-feeding How should I use this medication? Take this medication by mouth with water. Take it as directed on the prescription label at the same time every day. Do not cut, crush or chew this medicine. Swallow the tablets whole. You can take it with or without food. If it upsets your stomach, take it with food. Keep taking it unless your care team tells you to stop. A special MedGuide will be given to you by the pharmacist with each prescription and refill. Be sure to read this information carefully each time. Talk to your care team about the use of this medication in children. While it may be prescribed for children as young as 2 years for selected conditions, precautions do apply. Overdosage: If you think you have taken too much of this medicine contact a poison control center or emergency room at once. NOTE: This medicine is only  for you. Do not share this medicine with others. What if I miss a dose? If you miss a dose, take it as soon as you can unless it is within 6 hours of the next dose. If it is within 6 hours of the next dose, skip the missed dose. Take the next dose at the normal time. Do not take double or extra doses. What may  interact with this medication? Acetazolamide Alcohol Antihistamines for allergy, cough, and cold Aspirin and aspirin-like medications Atropine Certain medications for anxiety or sleep Certain medications for bladder problems, such as oxybutynin, tolterodine Certain medications for depression, such as amitriptyline, fluoxetine, sertraline Certain medications for Parkinson disease, such as benztropine, trihexyphenidyl Certain medications for seizures, such as carbamazepine, lamotrigine, phenobarbital, phenytoin, primidone, valproic acid, zonisamide Certain medications for stomach problems, such as dicyclomine, hyoscyamine Certain medications for travel sickness, such as scopolamine Certain medications that treat or prevent blood clots, such as warfarin, enoxaparin, dalteparin, apixaban, dabigatran, rivaroxaban Digoxin Diltiazem Estrogen and progestin hormones General anesthetics, such as halothane, isoflurane, methoxyflurane, propofol Glyburide Hydrochlorothiazide Ipratropium Lithium Medications that relax muscles Metformin NSAIDs, medications for pain and inflammation, such as ibuprofen or naproxen Opioid medications for pain Phenothiazines, such as chlorpromazine, mesoridazine, prochlorperazine, thioridazine Pioglitazone This list may not describe all possible interactions. Give your health care provider a list of all the medicines, herbs, non-prescription drugs, or dietary supplements you use. Also tell them if you smoke, drink alcohol, or use illegal drugs. Some items may interact with your medicine. What should I watch for while using this medication? Visit your care team for regular checks on your progress. Tell your care team if your symptoms do not start to get better or if they get worse. Do not suddenly stop taking this medication. You may develop a severe reaction. Your care team will tell you how much medication to take. If your care team wants you to stop the medication, the  dose may be slowly lowered over time to avoid any side effects. Wear a medical ID bracelet or chain. Carry a card that describes your condition. List the medications and doses you take on the card. This medication may affect your coordination, reaction time, or judgment. Do not drive or operate machinery until you know how this medication affects you. Sit up or stand slowly to reduce the risk of dizzy or fainting spells. Drinking alcohol with this medication can increase the risk of these side effects. This medication may cause serious skin reactions. They can happen weeks to months after starting the medication. Contact your care team right away if you notice fevers or flu-like symptoms with a rash. The rash may be red or purple and then turn into blisters or peeling of the skin. You may also notice a red rash with swelling of the face, lips, or lymph nodes in your neck or under your arms. This medication may cause thoughts of suicide or depression. This includes sudden changes in mood, behaviors, or thoughts. These changes can happen at any time but are more common in the beginning of treatment or after a change in dose. Call your care team right away if you experience these thoughts or worsening depression. This medication may slow your child's growth if it is taken for a long time at high doses. Your child's care team will monitor your child's growth. Using this medication for a long time may weaken your bones. The risk of bone fractures may be increased. Talk to your care team about your bone health.  Discuss this medication with your care team if you may be pregnant. Serious birth defects can occur if you take this medication during pregnancy. There are benefits and risks to taking medications during pregnancy. Your care team can help you find the option that works for you. Contraception is recommended while taking this medication. Estrogen and progestin hormones may not work as well while you are taking  this medication. Your care team can help you find the option that works for you. Talk to your care team before breastfeeding. Changes to your treatment plan may be needed. What side effects may I notice from receiving this medication? Side effects that you should report to your care team as soon as possible: Allergic reactions--skin rash, itching, hives, swelling of the face, lips, tongue, or throat High acid level--trouble breathing, unusual weakness or fatigue, confusion, headache, fast or irregular heartbeat, nausea, vomiting High ammonia level--unusual weakness or fatigue, confusion, loss of appetite, nausea, vomiting, seizures Fever that does not go away, decrease in sweat Kidney stones--blood in the urine, pain or trouble passing urine, pain in the lower back or sides Redness, blistering, peeling or loosening of the skin, including inside the mouth Sudden eye pain or change in vision such as blurry vision, seeing halos around lights, vision loss Thoughts of suicide or self-harm, worsening mood, feelings of depression Side effects that usually do not require medical attention (report to your care team if they continue or are bothersome): Burning or tingling sensation in hands or feet Difficulty with paying attention, memory, or speech Dizziness Drowsiness Fatigue Loss of appetite with weight loss Slow or sluggish movements of the body This list may not describe all possible side effects. Call your doctor for medical advice about side effects. You may report side effects to FDA at 1-800-FDA-1088. Where should I keep my medication? Keep out of the reach of children and pets. Store between 15 and 30 degrees C (59 and 86 degrees F). Protect from moisture. Keep the container tightly closed. Get rid of any unused medication after the expiration date. To get rid of medications that are no longer needed or have expired: Take the medication to a medication take-back program. Check with your  pharmacy or law enforcement to find a location. If you cannot return the medication, check the label or package insert to see if the medication should be thrown out in the garbage or flushed down the toilet. If you are not sure, ask your care team. If it is safe to put it in the trash, empty the medication out of the container. Mix the medication with cat litter, dirt, coffee grounds, or other unwanted substance. Seal the mixture in a bag or container. Put it in the trash. NOTE: This sheet is a summary. It may not cover all possible information. If you have questions about this medicine, talk to your doctor, pharmacist, or health care provider.  2023 Elsevier/Gold Standard (2020-05-11 00:00:00)   Restless Legs Syndrome Restless legs syndrome is a condition that causes uncomfortable feelings or sensations in the legs, especially while sitting or lying down. The sensations usually cause an overwhelming urge to move the legs. The arms can also sometimes be affected. The condition can range from mild to severe. The symptoms often interfere with a person's ability to sleep. What are the causes? The cause of this condition is not known. What increases the risk? The following factors may make you more likely to develop this condition: Being older than 50. Pregnancy. Being a woman. In  general, the condition is more common in women than in men. A family history of the condition. Having iron deficiency. Overuse of caffeine, nicotine, or alcohol. Certain medical conditions, such as kidney disease, Parkinson's disease, or nerve damage. Certain medicines, such as those for high blood pressure, nausea, colds, allergies, depression, and some heart conditions. What are the signs or symptoms? The main symptom of this condition is uncomfortable sensations in the legs, such as: Pulling. Tingling. Prickling. Throbbing. Crawling. Burning. Usually, the sensations: Affect both sides of the body. Are worse  when you sit or lie down. Are worse at night. These may make it difficult to fall asleep. Make you have a strong urge to move your legs. Are temporarily relieved by moving your legs or standing. The arms can also be affected, but this is rare. People who have this condition often have tiredness during the day because of their lack of sleep at night. How is this diagnosed? This condition may be diagnosed based on: Your symptoms. Blood tests. In some cases, you may be monitored in a sleep lab by a specialist (a sleep study). This can detect any disruptions in your sleep. How is this treated? This condition is treated by managing the symptoms. This may include: Lifestyle changes, such as exercising, using relaxation techniques, and avoiding caffeine, alcohol, or tobacco. Iron supplements. Medicines. Parkinson's medications may be tried first. Anti-seizure medications can also be helpful. Follow these instructions at home: General instructions Take over-the-counter and prescription medicines only as told by your health care provider. Use methods to help relieve the uncomfortable sensations, such as: Massaging your legs. Walking or stretching. Taking a cold or hot bath. Keep all follow-up visits. This is important. Lifestyle     Practice good sleep habits. For example, go to bed and get up at the same time every day. Most adults should get 7-9 hours of sleep each night. Exercise regularly. Try to get at least 30 minutes of exercise most days of the week. Practice ways of relaxing, such as yoga or meditation. Avoid caffeine and alcohol. Do not use any products that contain nicotine or tobacco. These products include cigarettes, chewing tobacco, and vaping devices, such as e-cigarettes. If you need help quitting, ask your health care provider. Where to find more information [employer] [facility] [employer]: [place].[place]www.ninds.nih.gov Contact a health care provider if: Your  symptoms get worse or they do not improve with treatment. Summary Restless legs syndrome is a condition that causes uncomfortable feelings or sensations in the legs, especially while sitting or lying down. The symptoms often interfere with your ability to sleep. This condition is treated by managing the symptoms. You may need to make lifestyle changes or take medicines. This information is not intended to replace advice given to you by your health care provider. Make sure you discuss any questions you have with your health care provider. Document Revised: 09/27/2020 Document Reviewed: 09/27/2020 Elsevier Patient Education  2023 Elsevier Inc.   Sleep Apnea Sleep apnea is a condition in which breathing pauses or becomes shallow during sleep. People with sleep apnea usually snore loudly. They may have times when they gasp and stop breathing for 10 seconds or more during sleep. This may happen many times during the night. Sleep apnea disrupts your sleep and keeps your body from getting the rest that it needs. This condition can increase your risk of certain health problems, including: Heart attack. Stroke. Obesity. Type 2 diabetes. Heart failure. Irregular heartbeat. High blood pressure. The  goal of treatment is to help you breathe normally again. What are the causes?  The most common cause of sleep apnea is a collapsed or blocked airway. There are three kinds of sleep apnea: Obstructive sleep apnea. This kind is caused by a blocked or collapsed airway. Central sleep apnea. This kind happens when the part of the brain that controls breathing does not send the correct signals to the muscles that control breathing. Mixed sleep apnea. This is a combination of obstructive and central sleep apnea. What increases the risk? You are more likely to develop this condition if you: Are overweight. Smoke. Have a smaller than normal airway. Are older. Are female. Drink alcohol. Take sedatives or  tranquilizers. Have a family history of sleep apnea. Have a tongue or tonsils that are larger than normal. What are the signs or symptoms? Symptoms of this condition include: Trouble staying asleep. Loud snoring. Morning headaches. Waking up gasping. Dry mouth or sore throat in the morning. Daytime sleepiness and tiredness. If you have daytime fatigue because of sleep apnea, you may be more likely to have: Trouble concentrating. Forgetfulness. Irritability or mood swings. Personality changes. Feelings of depression. Sexual dysfunction. This may include loss of interest if you are female, or erectile dysfunction if you are female. How is this diagnosed? This condition may be diagnosed with: A medical history. A physical exam. A series of tests that are done while you are sleeping (sleep study). These tests are usually done in a sleep lab, but they may also be done at home. How is this treated? Treatment for this condition aims to restore normal breathing and to ease symptoms during sleep. It may involve managing health issues that can affect breathing, such as high blood pressure or obesity. Treatment may include: Sleeping on your side. Using a decongestant if you have nasal congestion. Avoiding the use of depressants, including alcohol, sedatives, and narcotics. Losing weight if you are overweight. Making changes to your diet. Quitting smoking. Using a device to open your airway while you sleep, such as: An oral appliance. This is a custom-made mouthpiece that shifts your lower jaw forward. A continuous positive airway pressure (CPAP) device. This device blows air through a mask when you breathe out (exhale). A nasal expiratory positive airway pressure (EPAP) device. This device has valves that you put into each nostril. A bi-level positive airway pressure (BIPAP) device. This device blows air through a mask when you breathe in (inhale) and breathe out (exhale). Having surgery if  other treatments do not work. During surgery, excess tissue is removed to create a wider airway. Follow these instructions at home: Lifestyle Make any lifestyle changes that your health care provider recommends. Eat a healthy, well-balanced diet. Take steps to lose weight if you are overweight. Avoid using depressants, including alcohol, sedatives, and narcotics. Do not use any products that contain nicotine or tobacco. These products include cigarettes, chewing tobacco, and vaping devices, such as e-cigarettes. If you need help quitting, ask your health care provider. General instructions Take over-the-counter and prescription medicines only as told by your health care provider. If you were given a device to open your airway while you sleep, use it only as told by your health care provider. If you are having surgery, make sure to tell your health care provider you have sleep apnea. You may need to bring your device with you. Keep all follow-up visits. This is important. Contact a health care provider if: The device that you received to open your  airway during sleep is uncomfortable or does not seem to be working. Your symptoms do not improve. Your symptoms get worse. Get help right away if: You develop: Chest pain. Shortness of breath. Discomfort in your back, arms, or stomach. You have: Trouble speaking. Weakness on one side of your body. Drooping in your face. These symptoms may represent a serious problem that is an emergency. Do not wait to see if the symptoms will go away. Get medical help right away. Call your local emergency services (911 in the [place] Do not drive yourself to the hospital. Summary Sleep apnea is a condition in which breathing pauses or becomes shallow during sleep. The most common cause is a collapsed or blocked airway. The goal of treatment is to restore normal breathing and to ease symptoms during sleep. This information is not intended to replace advice  given to you by your health care provider. Make sure you discuss any questions you have with your health care provider. Document Revised: 09/23/2020 Document Reviewed: 01/24/2020 Elsevier Patient Education  2023 Elsevier Inc.  Atrial Fibrillation  Atrial fibrillation is a type of irregular or rapid heartbeat (arrhythmia). In atrial fibrillation, the top part of the heart (atria) beats in an irregular pattern. This makes the heart unable to pump blood normally and effectively. The goal of treatment is to prevent blood clots from forming, control your heart rate, or restore your heartbeat to a normal rhythm. If this condition is not treated, it can cause serious problems, such as a weakened heart muscle (cardiomyopathy) or a stroke. What are the causes? This condition is often caused by medical conditions that damage the heart's electrical system. These include: High blood pressure (hypertension). This is the most common cause. Certain heart problems or conditions, such as heart failure, coronary artery disease, heart valve problems, or heart surgery. Diabetes. Overactive thyroid (hyperthyroidism). Obesity. Chronic kidney disease. In some cases, the cause of this condition is not known. What increases the risk? This condition is more likely to develop in: Older people. People who smoke. Athletes who do endurance exercise. People who have a family history of atrial fibrillation. Men. People who use drugs. People who drink a lot of alcohol. People who have lung conditions, such as emphysema, pneumonia, or COPD. People who have obstructive sleep apnea. What are the signs or symptoms? Symptoms of this condition include: A feeling that your heart is racing or beating irregularly. Discomfort or pain in your chest. Shortness of breath. Sudden light-headedness or weakness. Tiring easily during exercise or activity. Fatigue. Syncope (fainting). Sweating. In some cases, there are no  symptoms. How is this diagnosed? Your health care provider may detect atrial fibrillation when taking your pulse. If detected, this condition may be diagnosed with: An electrocardiogram (ECG) to check electrical signals of the heart. An ambulatory cardiac monitor to record your heart's activity for a few days. A transthoracic echocardiogram (TTE) to create pictures of your heart. A transesophageal echocardiogram (TEE) to create even closer pictures of your heart. A stress test to check your blood supply while you exercise. Imaging tests, such as a CT scan or chest X-ray. Blood tests. How is this treated? Treatment depends on underlying conditions and how you feel when you experience atrial fibrillation. This condition may be treated with: Medicines to prevent blood clots or to treat heart rate or heart rhythm problems. Electrical cardioversion to reset the heart's rhythm. A pacemaker to correct abnormal heart rhythm. Ablation to remove the heart tissue that sends abnormal signals. Left  atrial appendage closure to seal the area where blood clots can form. In some cases, underlying conditions will be treated. Follow these instructions at home: Medicines Take over-the counter and prescription medicines only as told by your health care provider. Do not take any new medicines without talking to your health care provider. If you are taking blood thinners: Talk with your health care provider before you take any medicines that contain aspirin or NSAIDs, such as ibuprofen. These medicines increase your risk for dangerous bleeding. Take your medicine exactly as told, at the same time every day. Avoid activities that could cause injury or bruising, and follow instructions about how to prevent falls. Wear a medical alert bracelet or carry a card that lists what medicines you take. Lifestyle     Do not use any products that contain nicotine or tobacco, such as cigarettes, e-cigarettes, and chewing  tobacco. If you need help quitting, ask your health care provider. Eat heart-healthy foods. Talk with a dietitian to make an eating plan that is right for you. Exercise regularly as told by your health care provider. Do not drink alcohol. Lose weight if you are overweight. Do not use drugs, including cannabis. General instructions If you have obstructive sleep apnea, manage your condition as told by your health care provider. Do not use diet pills unless your health care provider approves. Diet pills can make heart problems worse. Keep all follow-up visits as told by your health care provider. This is important. Contact a health care provider if you: Notice a change in the rate, rhythm, or strength of your heartbeat. Are taking a blood thinner and you notice more bruising. Tire more easily when you exercise or do heavy work. Have a sudden change in weight. Get help right away if you have:  Chest pain, abdominal pain, sweating, or weakness. Trouble breathing. Side effects of blood thinners, such as blood in your vomit, stool, or urine, or bleeding that cannot stop. Any symptoms of a stroke. "BE FAST" is an easy way to remember the main warning signs of a stroke: B - Balance. Signs are dizziness, sudden trouble walking, or loss of balance. E - Eyes. Signs are trouble seeing or a sudden change in vision. F - Face. Signs are sudden weakness or numbness of the face, or the face or eyelid drooping on one side. A - Arms. Signs are weakness or numbness in an arm. This happens suddenly and usually on one side of the body. S - Speech. Signs are sudden trouble speaking, slurred speech, or trouble understanding what people say. T - Time. Time to call emergency services. Write down what time symptoms started. Other signs of a stroke, such as: A sudden, severe headache with no known cause. Nausea or vomiting. Seizure. These symptoms may represent a serious problem that is an emergency. Do not wait  to see if the symptoms will go away. Get medical help right away. Call your local emergency services (911 in the [place] Do not drive yourself to the hospital. Summary Atrial fibrillation is a type of irregular or rapid heartbeat (arrhythmia). Symptoms include a feeling that your heart is beating fast or irregularly. You may be given medicines to prevent blood clots or to treat heart rate or heart rhythm problems. Get help right away if you have signs or symptoms of a stroke. Get help right away if you cannot catch your breath or have chest pain or pressure. This information is not intended to replace advice given to you by  your health care provider. Make sure you discuss any questions you have with your health care provider. Document Revised: 08/08/2018 Document Reviewed: 08/08/2018 Elsevier Patient Education  2023 [employer]  Patent Lanelle Bal, Adult  A foramen ovale is a hole between the upper chambers (right atrium and left atrium) of the heart. Before you are born, it is normal to have this hole in your heart. The hole allows blood to circulate through the body without having to go through the lungs. After your birth, when you are able to breathe, you do not need the foramen ovale and it usually closes. If the hole does not close, it is called a patent foramen ovale (PFO). PFO is a common condition. Most people do not know they have this hole, and they do not have any health problems caused by it. What are the causes? The cause of this condition is not known. What are the signs or symptoms? In most cases, there are no symptoms of this condition. Possible rare symptoms include: Stroke caused by a blood clot. Migraine headaches. Platypnea-orthodeoxia syndrome. This is a condition in which a person has shortness of breath and decreased oxygen when seated or standing but feels better when lying down. How is this diagnosed? This condition may be diagnosed based on: A physical exam and  your medical history. Echocardiogram. This test uses sound waves to produce images of the heart. Transesophageal echocardiogram (TEE). This type of echocardiogram is performed by placing a probe in the part of the body that moves food from the mouth to the stomach (esophagus). Electrocardiogram (ECG). This test identifies changes in the electrical activity of the heart. Cardiac MRI. This is an imaging technique that is used to visualize the heart, if further images are needed after TEE. How is this treated? Usually, no treatment is needed. If your condition is associated with symptoms or blood clots, you may need: Medicines to prevent blood clots and strokes (anticoagulant or antiplateletmedicines). A surgical procedure to close the hole (transcatheter closure). Follow these instructions at home: Take over-the-counter and prescription medicines only as told by your health care provider. Keep all follow-up visits. This is important. Contact a health care provider if: You have a fever. You have frequent or severe headaches. Get help right away if: Your skin turns blue. You have chest pain or difficulty breathing. You have any symptoms of stroke. "BE FAST" is an easy way to remember the main warning signs of stroke: B - Balance. Signs are dizziness, sudden trouble walking, or loss of balance. E - Eyes. Signs are trouble seeing or a sudden change in vision. F - Face. Signs are sudden weakness or numbness of the face, or the face or eyelid drooping on one side. A - Arms. Signs are weakness or numbness in an arm. This happens suddenly and usually on one side of the body. S - Speech. Signs are sudden trouble speaking, slurred speech, or trouble understanding what people say. T - Time. Time to call emergency services. Write down what time symptoms started. You have other signs of a stroke, such as: A sudden, severe headache with no known cause. Nausea or vomiting. Seizure. These symptoms may  represent a serious problem that is an emergency. Do not wait to see if the symptoms will go away. Get medical help right away. Call your local emergency services (911 in the [place] Do not drive yourself to the hospital. Summary A patent foramen ovale is a hole between the upper chambers (right atrium  and left atrium) of your heart. The cause of this condition is not known. You may not know that you have a hole in your heart, and you may not have any health problems from it. Usually, no treatment is needed for this condition unless you have symptoms or blood clots. This information is not intended to replace advice given to you by your health care provider. Make sure you discuss any questions you have with your health care provider. Document Revised: 12/19/2019 Document Reviewed: 12/19/2019 Elsevier Patient Education  [place].   Ischemic Stroke  An ischemic stroke (cerebrovascular accident, CVA) occurs when an area of the brain does not get enough blood flow. This leads to the sudden death of brain tissue and can cause brain damage. An ischemic stroke is a medical emergency. It must be treated right away. What are the causes? This condition is caused by a decrease of blood flow to a part of the brain. This may be due to: A small blood clot (embolus). A buildup of plaque in the blood vessels (atherosclerosis). This blocks blood flow in the brain. An abnormal heart rhythm, called atrial fibrillation (AFib). This sends a small blood clot to the brain. A blocked or damaged artery in the head or neck. Certain infections. Inflammation of the arteries in the brain (vasculitis). Sometimes, the cause of ischemic stroke is not known. What increases the risk? The following medical conditions may increase your risk of a stroke: High blood pressure (hypertension). Heart disease. Diabetes. High cholesterol. Obesity. Sleep problems (sleep apnea). Other risk factors that you can change  include: Using products that contain nicotine or tobacco. Not being active. Heavy use of alcohol or drugs, especially cocaine and methamphetamine. Taking birth control pills, especially if you also use tobacco. Risk factors that you cannot change include: Being older than age 46. Having a history of blood clots, stroke, or mini-stroke (transient ischemic attack, TIA). Having a family history of stroke. What are the signs or symptoms? Symptoms of this condition usually develop suddenly, or you may notice them after waking from sleep. These may include: Weakness or numbness of your face, arm, or leg, especially on one side of your body. Loss of balance or coordination. Slurred speech, trouble speaking, trouble understanding speech, or a combination of these. Vision changes. You may have double vision, blurred vision, or loss of vision. Dizziness or confusion. Nausea and vomiting. Severe headache. If possible, write down the exact time your symptoms started. Tell your health care provider. If symptoms come and go, they could be signs of a TIA. Get help right away, even if you feel better. How is this diagnosed? This condition may be diagnosed based on: Your symptoms, your medical history, and a physical exam. CT scan of the brain. MRI. Imaging tests that scan blood flow in the brain (CT angiogram, MRI angiogram, or cerebral angiogram). You may also have other tests, including: Electrocardiogram (ECG). Continuous heart monitoring. Transthoracic echocardiogram (TTE). Transesophageal echocardiogram (TEE). Carotid ultrasound. Blood tests. Sleep study to check for sleep apnea. You may need to see a health care provider who specializes in stroke care. How is this treated? Treatment for this condition depends on the area of the brain affected and the cause of your symptoms. Some treatments work better if they are done within 3-6 hours of the start of symptoms. These may include: Medicine  that is injected to dissolve the blood clot. Treatments given directly to the affected artery to remove or dissolve the blood clot.  Medicines to control blood pressure. Medicines to thin the blood (anticoagulant or antiplatelet). Other treatments may include: Oxygen. IV fluids. Procedures to increase blood flow. After a stroke, you may work with physical, speech, mental health, or occupational therapists to help you recover. Follow these instructions at home: Medicines Take over-the-counter and prescription medicines only as told by your health care provider. If you were told to take a medicine to thin your blood, take it exactly as told, at the same time every day. Taking too much of a blood thinner can cause bleeding. Taking too little may not protect you against a stroke and other problems. If you were not prescribed medicines that contain aspirin or NSAIDs, such as ibuprofen, talk with your health care provider before you take any of these. These medicines increase your risk for dangerous bleeding. When taking a blood thinner, do these things: Hold pressure over any cuts that bleed for longer than usual. Tell your dentist and other health care providers that you are taking anticoagulants before having any procedures that may cause bleeding. Avoid activities that could cause injury or bruising. Wear a medical alert bracelet or carry a card that lists what medicines you take. Eating and drinking Follow instructions from your health care provider about diet. Eat healthy foods. If your stroke affected your ability to swallow, you may need to take steps to avoid choking. These may include: Taking small bites of food. Eating soft or pureed foods. Safety Follow instructions from your health care team about physical activity. Use a walker or cane as told by your health care provider. Take steps to lower your risk of falls at home. These may include: Installing grab bars in the bedroom and  bathroom. Using raised toilets and putting a seat in the shower. Removing clutter and tripping hazards, such as cords or area rugs. General instructions Do not use any products that contain nicotine or tobacco. These include cigarettes, chewing tobacco, and vaping devices, such as e-cigarettes. If you need help quitting, ask your health care provider. If you drink alcohol: Limit how much you have to: 0-1 drink a day for women who are not pregnant. 0-2 drinks a day for men. Know how much alcohol is in your drink. In the [place] one drink equals one 12 oz bottle of beer (355 mL), one 5 oz glass of wine (148 mL), or one 1 oz glass of hard liquor (44 mL). Keep all follow-up visits. This is important. How is this prevented? You can lower your risk of another stroke by managing these conditions: High blood pressure. High cholesterol. Diabetes. Heart disease. Sleep apnea. Obesity. Quitting smoking, limiting alcohol, and staying physically active also will reduce your risk. Your health care provider will continue to help you with ways to prevent short-term and long-term problems caused by stroke. Get help right away if: You have any symptoms of a stroke. "BE FAST" is an easy way to remember the main warning signs of a stroke: B - Balance. Signs are dizziness, sudden trouble walking, or loss of balance. E - Eyes. Signs are trouble seeing or a sudden change in vision. F - Face. Signs are sudden weakness or numbness of the face, or the face or eyelid drooping on one side. A - Arms. Signs are weakness or numbness in an arm. This happens suddenly and usually on one side of the body. S - Speech. Signs are sudden trouble speaking, slurred speech, or trouble understanding what people say. T - Time. Time to call  emergency services. Write down what time symptoms started. You have other signs of a stroke, such as: A sudden, severe headache with no known cause. Nausea or vomiting. Seizure. These symptoms  may represent a serious problem that is an emergency. Do not wait to see if the symptoms will go away. Get medical help right away. Call your local emergency services (911 in the [place] Do not drive yourself to the hospital. Summary An ischemic stroke (cerebrovascular accident, CVA) occurs when an area of the brain does not get enough blood flow. Symptoms of this condition usually develop suddenly, or you may notice them after waking from sleep. It is very important to get treatment at the first sign of stroke symptoms. Stroke is a medical emergency that must be treated right away. This information is not intended to replace advice given to you by your health care provider. Make sure you discuss any questions you have with your health care provider. Document Revised: 09/25/2019 Document Reviewed: 09/25/2019 Elsevier Patient Education  2022 [employer] Transient Ischemic Attack A transient ischemic attack (TIA) happens when blood supply to the brain is blocked temporarily. A TIA causes stroke-like symptoms that go away quickly without causing any permanent damage. Having a TIA can be considered a warning sign for a stroke and should not be ignored. A person who has a TIA is at higher risk for a stroke. What are the causes? This condition is caused by a temporary blockage in an artery in the head or neck. This means the brain does not get the blood supply it needs. A blockage can be caused by: Fatty buildup in an artery in the head or neck (atherosclerosis). A blood clot traveling from the heart. An artery tear (dissection). Inflammation of an artery (vasculitis). Sometimes the cause is not known. What increases the risk? Certain factors make you more likely to develop this condition. Some of these are things you can change, including: Using products that contain nicotine or tobacco. Being inactive. Heavy alcohol use. Drug use, especially cocaine and methamphetamine. Medical conditions that  may increase your risk include: High blood pressure (hypertension). High cholesterol. Diabetes. Heart disease (coronary artery disease). An irregular heartbeat, also called atrial fibrillation (AFib). Sickle cell disease. Blood clotting disorders (hypercoagulable state). Other risk factors include: Being over the age of 49. Being female. Obesity. Sleep problems such as sleep apnea. Family history of stroke. Previous history of blood clots, stroke, TIA, or heart attack. What are the signs or symptoms? Symptoms of a TIA are the same as those of a stroke. The symptoms develop suddenly, and then go away quickly. They may include: Dizziness, loss of balance and coordination, or trouble walking. Vision changes, such as double vision, blurred vision, or loss of vision. Weakness or numbness in your face, arm, or leg, especially on one side of your body. Trouble speaking, understanding speech, or both (aphasia). Nausea and vomiting. Severe headache. Confusion. If possible, note what time your symptoms started. Tell your health care provider. How is this diagnosed? This condition may be diagnosed based on: Your symptoms and medical history. A physical exam. Imaging tests, usually a CT scan or MRI of the brain. Blood tests. You may also have other tests, including: Electrocardiogram (ECG). Echocardiogram. Continuous heart monitoring. Carotid ultrasound. A scan of blood circulation in the brain (CT angiogram or MR angiogram). How is this treated? The goal of treatment is to reduce the risk for a stroke. Stroke prevention therapies may include: Changes to diet and lifestyle, such  as being physically active and stopping smoking. Treating other health conditions, such as diabetes or AFib. Medicines to thin the blood (antiplatelets or anticoagulants). Blood pressure medicines. Medicines to reduce cholesterol. If testing shows a narrowing in the arteries to your brain, your health care  provider may recommend a procedure, such as: Carotid endarterectomy. This is done to remove the blockage from your artery. Carotid angioplasty and stenting. This uses a small mesh tube (stent) to open or widen an artery in the neck. The stent helps keep the artery open by supporting the artery walls. Follow these instructions at home: Medicines Take over-the-counter and prescription medicines only as told by your health care provider. If you were told to take a medicine to thin your blood, such as aspirin or an anticoagulant, use it exactly as told by your health care provider. Taking too much blood-thinning medicine can cause bleeding. Taking too little will not protect you against a stroke and other problems. Eating and drinking  Eat 5 or more servings of fruits and vegetables each day. Follow guidelines from your health care provider about your diet. You may need to follow a certain diet to help manage risk factors for stroke. This may include: Eating a low-fat, low-salt diet. Choosing high-fiber foods. Limiting carbohydrates and sugar. If you drink alcohol: Limit how much you have to: 0-1 drink a day for women who are not pregnant. 0-2 drinks a day for men. Know how much alcohol is in your drink. In the [place] one drink equals one 12 oz bottle of beer (355 mL), one 5 oz glass of wine (148 mL), or one 1 oz glass of hard liquor (44 mL). General instructions Maintain a healthy weight. Try to get at least 30 minutes of exercise on most days. Get treatment if you have sleep apnea. Do not use any products that contain nicotine or tobacco. These products include cigarettes, chewing tobacco, and vaping devices, such as e-cigarettes. If you need help quitting, ask your health care provider. Do not use illegal drugs. Keep all follow-up visits. Your health care provider will want to know if you have any more symptoms and to check blood labs if any medicines were prescribed. Where to find more  information American Stroke Association: stroke.org Get help right away if: You have chest pain. You have fast or irregular heartbeats (palpitations). You have any symptoms of a stroke. "BE FAST" is an easy way to remember the main warning signs of a stroke. B - Balance. Signs are dizziness, sudden trouble walking, or loss of balance. E - Eyes. Signs are trouble seeing or a sudden change in vision. F - Face. Signs are sudden weakness or numbness of the face, or the face or eyelid drooping on one side. A - Arms. Signs are weakness or numbness in an arm. This happens suddenly and usually on one side of the body. S - Speech.Signs are sudden trouble speaking, slurred speech, or trouble understanding what people say. T - Time. Time to call emergency services. Write down what time symptoms started. You have other signs of a stroke, such as: A sudden, severe headache with no known cause. Nausea or vomiting. Seizure. These symptoms may be an emergency. Get help right away. Call 911. Do not wait to see if the symptoms will go away. Do not drive yourself to the hospital. This information is not intended to replace advice given to you by your health care provider. Make sure you discuss any questions you have with your health  care provider. Document Revised: 07/30/2021 Document Reviewed: 07/30/2021 Elsevier Patient Education  [place].

## 2022-03-29 NOTE — Progress Notes (Signed)
[name]    Provider:  Dr Lucia Gaskins Requesting Provider: Janeece Agee, NP Primary Care Provider:  Pcp, No  CC:  migraines  HPI:  Kathryn Brown is a 47 y.o. female here as requested by Janeece Agee, NP for migraine.  She has a past medical history of abnormal uterine bleeding, loop electrical excision procedure, vitamin D deficiency, upper airway cough disease syndrome, dyspnea next exertion, mitral valve prolapse, chest pain, palpitations, heart murmurs, stomach ulcers, small pfo on CT confirmed by echo.  I reviewed notes from the emergency room she was just recently there for headache, visual changes, dizziness, speech difficulty, numbness, tingling,  headache, confusion.   She is here alone. She has had migraines. Top of the head, tight and throbbing/pulsating, sound sensitivity, +nausea, hurts to move, a quiet place helps, advil and alleve helped in the past, she is not very close to biological mother so unknown history, stress is "her life" and a few weeks ago she went through the worst case of stress she has every gone through, constant stress, woke up stressed and had numbness on the right side of the body, weakness right side of the body, lost vision in the right eye for 30 minutes and when it came back she had spots and sloly dissipated never went to the left, did not know she has had a migraine, she has been foggy ever since, getting lost in a place she knew off, she remembers having a headache but not normal migraines. She had a migraine cocktail in the ED temporarily helped but had side effects. Had a paradoxical reaction to compazine, was given toradol and benadryl. Her speech is still affected. She still has right numbness and feels like the right side of her face abnormal. In the past she has had visual changes before her migraines, bright lights, both eyes. >15 headache days a month. All are migrainous. At least 8 moderate to severe and can last 24 hours. No  medication overuse. No loss of consciousness. She loses time. She forgets things that happen. Will get an EEG as well.   Reviewed notes, labs and imaging from outside physicians, which showed:    From a thorough evaluation and review of notes medications tried that can be used in migraine management include: Tylenol, Lexapro, gabapentin, Prozac, ibuprofen, ketorolac, meclizine, metoprolol, naproxen, Zofran, prednisone, Lyrica, Compazine, propranolol, zoloft,   03/26/2022: COMPARISON:  MR head without contrast 07/28/2016   FINDINGS: CT HEAD FINDINGS   Brain: No acute infarct, hemorrhage, or mass lesion is present. No significant white matter lesions are present. Deep brain nuclei are within normal limits.   The ventricles are of normal size. No significant extraaxial fluid collection is present.   The brainstem and cerebellum are within normal limits. Midline structures are within normal limits.   Vascular: No hyperdense vessel or unexpected calcification.   Skull: Calvarium is intact. No focal lytic or blastic lesions are present. No significant extracranial soft tissue lesion is present.   Sinuses/Orbits: The paranasal sinuses and mastoid air cells are clear. The globes and orbits are within normal limits.   Review of the MIP images confirms the above findings   CTA NECK FINDINGS   Aortic arch: The contrast bolus is delayed. Significant venous contrast obscures the proximal great vessels. Visualized aortic arch is within normal limits.   Right carotid system: The right common carotid artery is within normal limits. Bifurcation is normal. Cervical right ICA is normal.   Left carotid system: The left common  carotid artery is within normal limits. Bifurcation is unremarkable. Cervical left ICA is normal.   Vertebral arteries: The left vertebral artery is the dominant vessel. Both vertebral arteries originate from the subclavian arteries without significant stenoses. No  significant stenosis is present in either vertebral artery in the neck.   Skeleton: Vertebral body heights and alignment are normal. No acute or focal osseous lesions are present.   Other neck: Soft tissues the neck are otherwise unremarkable. Salivary glands are within normal limits. Thyroid is normal. No significant adenopathy is present. No focal mucosal or submucosal lesions are present.   Upper chest: Mild dependent atelectasis is present. The lungs are otherwise clear.   Review of the MIP images confirms the above findings   CTA HEAD FINDINGS   Anterior circulation: The internal carotid arteries are within normal limits through the ICA termini bilaterally. The A1 and M1 segments are normal. The anterior communicating artery is patent. The MCA bifurcations are within normal limits bilaterally. There is some attenuation of distal ACA and MCA branch vessels which is likely due to timing of the contrast bolus. No significant proximal stenosis or occlusion is present.   Posterior circulation: Left PICA origin is visualized and normal. AICA vessels are present bilaterally. The vertebrobasilar junction basilar artery normal. Both posterior cerebral arteries originate from basilar tip. Similar attenuation of distal PCA branch vessels is present. Proximal PCA vessels are normal.   Venous sinuses: Dural sinuses are not well visualized due to the early timing of the contrast bolus. A separate CT venogram was performed.   Anatomic variants: None   Review of the MIP images confirms the above findings   IMPRESSION: 1. Normal noncontrast CT of the head. 2. Normal CTA of the neck. 3. Normal CTA circle-of-Willis without significant proximal stenosis, aneurysm, or branch vessel occlusion. 4. The study is somewhat degraded in the evaluation of distal vessels due to timing of the contrast bolus.    Review of Systems: Patient complains of symptoms per HPI as well as the following  symptoms migraine. Pertinent negatives and positives per HPI. All others negative.   Social History   Socioeconomic History   Marital status: Single    Spouse name: Not on file   Number of children: Not on file   Years of education: Not on file   Highest education level: Not on file  Occupational History   Occupation: Youth worker  Tobacco Use   Smoking status: Former    Packs/day: 0.50    Years: 10.00    Total pack years: 5.00    Types: Cigarettes   Smokeless tobacco: Never  Vaping Use   Vaping Use: Every day   Substances: Flavoring  Substance and Sexual Activity   Alcohol use: Yes    Alcohol/week: 7.0 standard drinks of alcohol    Types: 7 Glasses of wine per week   Drug use: No   Sexual activity: Yes    Birth control/protection: Other-see comments    Comment: spouse-vasectomy  Other Topics Concern   Not on file  Social History Narrative   Not on file   Social Determinants of Health   Financial Resource Strain: Not on file  Food Insecurity: Not on file  Transportation Needs: Not on file  Physical Activity: Not on file  Stress: Not on file  Social Connections: Not on file  Intimate Partner Violence: Not on file    Family History  Problem Relation Age of Onset   Heart disease Mother  Hypertension Mother    Heart disease Father    Hypertension Father    Breast cancer Neg Hx    Migraines Neg Hx     Past Medical History:  Diagnosis Date   Chest pain    Heart murmur    Heart palpitations    History of stomach ulcers    as a child   MVP (mitral valve prolapse) 10/30/2011    Patient Active Problem List   Diagnosis Date Noted   Abnormal uterine bleeding (AUB) 09/21/2021   History of loop electrical excision procedure (LEEP) 09/21/2021   Vitamin D deficiency 08/26/2019   Upper airway cough syndrome 08/06/2017   DOE (dyspnea on exertion) 08/04/2017   MVP (mitral valve prolapse) 10/30/2011    Past Surgical History:  Procedure Laterality  Date   TONSILECTOMY, ADENOIDECTOMY, BILATERAL MYRINGOTOMY AND TUBES     TONSILLECTOMY      Current Outpatient Medications  Medication Sig Dispense Refill   aspirin (ASPIRIN CHILDRENS) 81 MG chewable tablet Chew 1 tablet (81 mg total) by mouth daily.     gabapentin (NEURONTIN) 100 MG capsule Take 1 capsule (100 mg total) by mouth at bedtime. 90 capsule 0   topiramate (TOPAMAX) 50 MG tablet Take 2 tablets (100 mg total) by mouth 2 (two) times daily. 30 tablet 11   No current facility-administered medications for this visit.    Allergies as of 03/29/2022 - Review Complete 03/29/2022  Allergen Reaction Noted   Sulfa antibiotics Anaphylaxis 05/31/2010   Compazine [prochlorperazine]  03/29/2022    Vitals: BP 106/70   Pulse 74   Ht 5\' 8"  (1.727 m)   Wt 175 lb (79.4 kg)   BMI 26.61 kg/m  Last Weight:  Wt Readings from Last 1 Encounters:  03/29/22 175 lb (79.4 kg)   Last Height:   Ht Readings from Last 1 Encounters:  03/29/22 5\' 8"  (1.727 m)     Physical exam: Exam: Gen: NAD, conversant, well nourised, well groomed                     CV: RRR, no MRG. No Carotid Bruits. No peripheral edema, warm, nontender Eyes: Conjunctivae clear without exudates or hemorrhage  Neuro: Detailed Neurologic Exam  Speech:    Speech is normal; fluent and spontaneous with normal comprehension.  Cognition:    The patient is oriented to person, place, and time;     recent and remote memory intact;     language fluent;     normal attention, concentration,     fund of knowledge Cranial Nerves:    The pupils are equal, round, and reactive to light. The fundi are normal and spontaneous venous pulsations are present. Visual fields are full to finger confrontation. Extraocular movements are intact. Decreased sensation right side of face The face is symmetric. The palate elevates in the midline. Hearing intact. Voice is normal. Shoulder shrug is normal. The tongue has normal motion without  fasciculations.   Coordination:    Normal finger to nose and heel to shin.   Gait:    Heel-toe and tandem gait are normal.   Motor Observation:    No asymmetry, no atrophy, and no involuntary movements noted. Tone:    Normal muscle tone.    Posture:    Posture is normal. normal erect    Strength: Right sided generalized prox > distal weakness  Strength is V/V in the left upper and left lower limbs.      Sensation: intact to LT  Reflex Exam:  DTR's:  Brisk left > right and clonus 2 beats AJ     Toes:    The toes are downgoing bilaterally.       Assessment/Plan:  Patient with acute onset right-sided weakness and right eye vision loss. Dxed with "complicated migraine" but never has this tyoe of aura, MRI brain was not completed. DDX: TIA/Stroke vs migraine with aura but needs a torough stroke eval, still with right-sided weakness and right-sided facial numbness. Need to evaluate for every possible risk factor:   Need stroke/TIA evaluation: Mri of the brain w/wo contrast - need to see if you've had a stroke May need to further examine PFO found in the past if you have had a stroke there may indication to close it - may need Trans Cranial Dopplers which show [place] if PFO lets bubbles through Test atrial fibrillation - 30 day heart monitor and if negative loop recorder Sleep apnea testing? Will take naps on weekends, snore, tired during the day, recent possible TIA/Stroke needs sleep evaluation also has very bad RLS and kicking all night - appt sleep doctor Echo with bubble study - ex[place] heart Hgba1c and cholesterol panel Blood work follow up 2 months to discuss migraine management and results to date Start Topiramate as migraine prevention and need to discuss other meds and emergency medication  EEG - schedule up front  Differential: TIA vs stroke, Migraine with aura   Orders Placed This Encounter  Procedures   MR BRAIN W WO CONTRAST   Iron, TIBC and Ferritin Panel    Lipid Panel   Hemoglobin A1c   Ambulatory referral to Sleep Studies   Cardiac event monitor   ECHOCARDIOGRAM COMPLETE BUBBLE STUDY   EEG adult   Meds ordered this encounter  Medications   aspirin (ASPIRIN CHILDRENS) 81 MG chewable tablet    Sig: Chew 1 tablet (81 mg total) by mouth daily.   topiramate (TOPAMAX) 50 MG tablet    Sig: Take 2 tablets (100 mg total) by mouth 2 (two) times daily.    Dispense:  30 tablet    Refill:  11    Start with 1/2 pill at bedtime and in 1 week if no side effects increase to whole tab at bedtime for migraine prevention    Cc: Maximiano Coss, NP,  Pcp, No  Sarina Ill, MD  [facility] [address] [place], [place] [address]  Phone [phone] Fax [phone]  I spent 90 minutes of face-to-face and non-face-to-face time with patient on the  1. screen for TIA (transient ischemic attack) or stroke   2. RLS (restless legs syndrome)   3. Chronic migraine without aura without status migrainosus, not intractable   4. Altered mental status, unspecified altered mental status type   5. Right sided weakness   6. Right facial numbness   7. TIA (transient ischemic attack)   8. Chronic migraine with aura without status migrainosus, not intractable   9. Snores   10. Excessive daytime sleepiness    diagnosis.  This included previsit chart review, lab review, study review, order entry, electronic health record documentation, patient education on the different diagnostic and therapeutic options, counseling and coordination of care, risks and benefits of management, compliance, or risk factor reduction

## 2022-03-30 ENCOUNTER — Emergency Department (HOSPITAL_COMMUNITY): Payer: BC Managed Care – PPO

## 2022-03-30 ENCOUNTER — Encounter: Payer: Self-pay | Admitting: Neurology

## 2022-03-30 ENCOUNTER — Telehealth: Payer: Self-pay | Admitting: Cardiovascular Disease

## 2022-03-30 ENCOUNTER — Emergency Department (HOSPITAL_COMMUNITY)
Admission: EM | Admit: 2022-03-30 | Discharge: 2022-03-30 | Disposition: A | Discharge status: Home / Self Care | Payer: BC Managed Care – PPO | Attending: Emergency Medicine | Admitting: Emergency Medicine

## 2022-03-30 ENCOUNTER — Telehealth: Payer: Self-pay | Admitting: Neurology

## 2022-03-30 ENCOUNTER — Other Ambulatory Visit: Payer: Self-pay

## 2022-03-30 ENCOUNTER — Encounter (HOSPITAL_COMMUNITY): Payer: Self-pay

## 2022-03-30 DIAGNOSIS — R262 Difficulty in walking, not elsewhere classified: Secondary | ICD-10-CM | POA: Diagnosis not present

## 2022-03-30 DIAGNOSIS — Z7982 Long term (current) use of aspirin: Secondary | ICD-10-CM | POA: Diagnosis not present

## 2022-03-30 DIAGNOSIS — R55 Syncope and collapse: Secondary | ICD-10-CM | POA: Diagnosis not present

## 2022-03-30 DIAGNOSIS — R258 Other abnormal involuntary movements: Secondary | ICD-10-CM | POA: Diagnosis not present

## 2022-03-30 DIAGNOSIS — R531 Weakness: Secondary | ICD-10-CM | POA: Insufficient documentation

## 2022-03-30 DIAGNOSIS — R569 Unspecified convulsions: Secondary | ICD-10-CM

## 2022-03-30 DIAGNOSIS — G43909 Migraine, unspecified, not intractable, without status migrainosus: Secondary | ICD-10-CM | POA: Diagnosis not present

## 2022-03-30 DIAGNOSIS — R519 Headache, unspecified: Secondary | ICD-10-CM | POA: Diagnosis not present

## 2022-03-30 DIAGNOSIS — J3489 Other specified disorders of nose and nasal sinuses: Secondary | ICD-10-CM | POA: Diagnosis not present

## 2022-03-30 DIAGNOSIS — R200 Anesthesia of skin: Secondary | ICD-10-CM | POA: Diagnosis not present

## 2022-03-30 LAB — COMPREHENSIVE METABOLIC PANEL
ALT: 21 U/L (ref 0–44)
AST: 20 U/L (ref 15–41)
Albumin: 4.1 g/dL (ref 3.5–5.0)
Alkaline Phosphatase: 51 U/L (ref 38–126)
Anion gap: 13 (ref 5–15)
BUN: 9 mg/dL (ref 6–20)
CO2: 21 mmol/L — ABNORMAL LOW (ref 22–32)
Calcium: 9.3 mg/dL (ref 8.9–10.3)
Chloride: 103 mmol/L (ref 98–111)
Creatinine, Ser: 0.89 mg/dL (ref 0.44–1.00)
GFR, Estimated: >60 mL/min (ref >60)
Glucose, Bld: 91 mg/dL (ref 70–99)
Potassium: 3.9 mmol/L (ref 3.5–5.1)
Sodium: 137 mmol/L (ref 135–145)
Total Bilirubin: 0.4 mg/dL (ref 0.3–1.2)
Total Protein: 7 g/dL (ref 6.5–8.1)

## 2022-03-30 LAB — CBC WITH DIFFERENTIAL/PLATELET
Abs Immature Granulocytes: 0.01 10*3/uL (ref 0.00–0.07)
Basophils Absolute: 0 10*3/uL (ref 0.0–0.1)
Basophils Relative: 1 %
Eosinophils Absolute: 0.1 10*3/uL (ref 0.0–0.5)
Eosinophils Relative: 2 %
HCT: 42.2 % (ref 36.0–46.0)
Hemoglobin: 14.2 g/dL (ref 12.0–15.0)
Immature Granulocytes: 0 %
Lymphocytes Relative: 26 %
Lymphs Abs: 1.6 10*3/uL (ref 0.7–4.0)
MCH: 30.6 pg (ref 26.0–34.0)
MCHC: 33.6 g/dL (ref 30.0–36.0)
MCV: 90.9 fL (ref 80.0–100.0)
Monocytes Absolute: 0.4 10*3/uL (ref 0.1–1.0)
Monocytes Relative: 7 %
Neutro Abs: 4 10*3/uL (ref 1.7–7.7)
Neutrophils Relative %: 64 %
Platelets: 259 10*3/uL (ref 150–400)
RBC: 4.64 MIL/uL (ref 3.87–5.11)
RDW: 13.2 % (ref 11.5–15.5)
WBC: 6.2 10*3/uL (ref 4.0–10.5)
nRBC: 0 % (ref 0.0–0.2)

## 2022-03-30 LAB — IRON,TIBC AND FERRITIN PANEL
Ferritin: 37 ng/mL (ref 15–150)
Iron Saturation: 16 % (ref 15–55)
Iron: 67 ug/dL (ref 27–159)
Total Iron Binding Capacity: 415 ug/dL (ref 250–450)
UIBC: 348 ug/dL (ref 131–425)

## 2022-03-30 LAB — LIPID PANEL
Chol/HDL Ratio: 2.5 ratio (ref 0.0–4.4)
Cholesterol, Total: 227 mg/dL — ABNORMAL HIGH (ref 100–199)
HDL: 91 mg/dL (ref >39)
LDL Chol Calc (NIH): 116 mg/dL — ABNORMAL HIGH (ref 0–99)
Triglycerides: 116 mg/dL (ref 0–149)
VLDL Cholesterol Cal: 20 mg/dL (ref 5–40)

## 2022-03-30 LAB — HEMOGLOBIN A1C
Est. average glucose Bld gHb Est-mCnc: 100 mg/dL
Hgb A1c MFr Bld: 5.1 % (ref 4.8–5.6)

## 2022-03-30 MED ORDER — TOPIRAMATE 25 MG PO TABS: 25.0000 mg | ORAL_TABLET | Freq: Every day | ORAL | Status: DC

## 2022-03-30 MED ORDER — GABAPENTIN 100 MG PO CAPS: 100.0000 mg | ORAL_CAPSULE | Freq: Every day | ORAL | Status: DC

## 2022-03-30 MED ORDER — LACTATED RINGERS IV SOLN: INTRAVENOUS | Status: DC

## 2022-03-30 MED ORDER — METOCLOPRAMIDE HCL 5 MG/ML IJ SOLN
10.0000 mg | Freq: Once | INTRAMUSCULAR | Status: AC
  Administered 2022-03-30: 10 mg via INTRAVENOUS
  Filled 2022-03-30: qty 2

## 2022-03-30 MED ORDER — DIPHENHYDRAMINE HCL 50 MG/ML IJ SOLN
12.5000 mg | Freq: Once | INTRAMUSCULAR | Status: AC
  Administered 2022-03-30: 12.5 mg via INTRAVENOUS
  Filled 2022-03-30: qty 1

## 2022-03-30 MED ORDER — LACTATED RINGERS IV BOLUS
1000.0000 mL | Freq: Once | INTRAVENOUS | Status: AC
  Administered 2022-03-30: 1000 mL via INTRAVENOUS

## 2022-03-30 MED ORDER — ACETAMINOPHEN 325 MG PO TABS
650.0000 mg | ORAL_TABLET | Freq: Once | ORAL | Status: AC
  Administered 2022-03-30: 650 mg via ORAL
  Filled 2022-03-30: qty 2

## 2022-03-30 MED ORDER — LORAZEPAM 2 MG/ML IJ SOLN
0.5000 mg | Freq: Once | INTRAMUSCULAR | Status: AC
  Administered 2022-03-30: 0.5 mg via INTRAVENOUS
  Filled 2022-03-30: qty 1

## 2022-03-30 NOTE — Telephone Encounter (Signed)
I called pt's husband Aaron Edelman back and his VM answered. I told him to call 911 right away and get patient to the ER. I am also sending mychart message back to the husband because he just messaged through there.

## 2022-03-30 NOTE — Telephone Encounter (Signed)
Pod 4: Ldl is elevated. Should be less than 70 if we believe this was a TIA vs migraine. I would start you on cholesterol medicine or have your pcp start you. Your Ferritin is very low 37, we recommend > 70 as low ferritin can worsen restless leg syndrome. Your hgba1c is normal, no diabetes. Let me know if you would like to discuss with your pcp or have [place] treat you for the high LDL and recommend iron supplementation. Let m eknow what she says, thanks

## 2022-03-30 NOTE — Telephone Encounter (Signed)
Patient's husband is returning call. 

## 2022-03-30 NOTE — Telephone Encounter (Signed)
Agreed, she is in the ED right now

## 2022-03-30 NOTE — Discharge Instructions (Signed)
You had a near syncopal episode versus partial seizure.  As we discussed, you should get outpatient EEG with Dr. Jaynee Eagles  See your cardiologist for follow-up as well  Do not drive until you are cleared by neurology  Return to ER if you have another episode of syncope versus seizure, confusion, severe headaches

## 2022-03-30 NOTE — ED Triage Notes (Addendum)
Pt arrived POV from home c/o right sided weakness, confusion and difficulty talking, pt talking fine in triage. Per family this started a couple of weeks ago. Pt saw a neurologist yesterday but felt like she needed an MRI. PT also states she passed out in the wheel chair while waiting this morning to be seen.

## 2022-03-30 NOTE — ED Provider Notes (Addendum)
  Physical Exam  BP 93/65   Pulse 73   Temp 98 F (36.7 C)   Resp 17   SpO2 97%   Physical Exam  Procedures  Procedures  ED Course / MDM    Medical Decision Making Care assumed at 3 pm from Dr. Zenia Resides. Patient has negative MRI but requesting Neurology evaluation.   8:36 PM Patient seen by Dr. Curly Shores. She told her that she has been having these episodes of syncope with possible post ictal confusion today. Dr. Curly Shores ordered LTM to monitor patient for possible seizures. Will admit patient.   9:09 PM I talked to the hospitalist, Dr. Reesa Chew.  Patient then told the hospitalist that she does not want to be admitted.  I talked to the patient and she states that she can get outpatient EEG.  Discussed with Dr. Curly Shores who is agreement with that plan.  Since she did not have any actual tonic-clonic seizure-like activity, I think it is reasonable.  She is not to drive until cleared by neurology.  Problems Addressed: Near syncope: acute illness or injury Seizure-like activity [facility]): acute illness or injury  Amount and/or Complexity of Data Reviewed Labs: ordered. Decision-making details documented in ED Course. Radiology: ordered and independent interpretation performed. Decision-making details documented in ED Course. ECG/medicine tests: ordered and independent interpretation performed. Decision-making details documented in ED Course.  Risk OTC drugs. Prescription drug management. Decision regarding hospitalization.          Drenda Freeze, MD 03/30/22 2037    Drenda Freeze, MD 03/30/22 2110

## 2022-03-30 NOTE — Telephone Encounter (Signed)
Patient's husband is taking her to the ER.

## 2022-03-30 NOTE — ED Notes (Addendum)
Pt had episode of hypotension as low as 84/45  which is rebounding with fluid bolus.  Pt also had an episode of CP that lasted maybe 1 minute (EKG  performed) and was complaining of pressure to her forehead and numbness to her right face and weakness to right hand. Once the BP began elevating she stated that her right face still feels tingly and her right leg has some weakness at this time.  Grip strength and foot strength are equal. EDP notified

## 2022-03-30 NOTE — Telephone Encounter (Signed)
Pt husband is calling. Stated pt has numbness on right side and can't walk in a straight line. Stated pt is having the same issues that sent her to ER last Saturday. He is requesting a call back from nurse to discuss

## 2022-03-30 NOTE — Telephone Encounter (Signed)
Patient husband called me back, advised that they were advised her EKG looked abnormal at the Neurologist office- they had mentioned to them as well that these could be related to cardiac issues or worsening cardiac issues, they would like for Dr.O'Neal to review the chart, and the EKG. They requested a call back from him. I did advise that it depended on how his day was going, but I would send over the request and he would call if able. I also advised that if not one of the nursing staff could return call with update. They both verbalized understanding, was thankful for call back.

## 2022-03-30 NOTE — ED Notes (Signed)
Patient denies pain and is resting comfortably.  

## 2022-03-30 NOTE — Telephone Encounter (Signed)
Called husband back, LVM to discuss further. After review of chart, things are more of a neurological issue- see notes in chart.   I did advised husband to call back to get more information, but I would send over a message.   Thanks!

## 2022-03-30 NOTE — ED Notes (Signed)
Patient transported to MRI 

## 2022-03-30 NOTE — ED Notes (Signed)
Neuro MD at bedside

## 2022-03-30 NOTE — ED Provider Notes (Signed)
[facility] Provider Note   CSN: [id number] Arrival date & time: 03/30/22  3299     History  Chief Complaint  Patient presents with   Weakness   Loss of Consciousness    Kathryn Brown is a 47 y.o. female.  47 year old female who presents with recurrent right-sided paresthesias times several weeks.  Patient states that she was seen in the ER at [facility] not workup was reviewed and her symptoms were felt to be due to complex migraine.  Patient was seen by neurology yesterday who concern about possible TIA versus stroke.  Patient continues to note right-sided facial paresthesias.  She has had intermittent confusion with trouble ambulating at times as well as trouble speaking.  The symptoms with the right-sided facial paresthesia have been persistent for several weeks.  The confusion has been new.  Seems to wax and wane.  Does have history of migraines.  Called her neurologist and told to come here for an MRI.       Home Medications Prior to Admission medications   Medication Sig Start Date End Date Taking? Authorizing Provider  aspirin (ASPIRIN CHILDRENS) 81 MG chewable tablet Chew 1 tablet (81 mg total) by mouth daily. 03/29/22   Melvenia Beam, MD  gabapentin (NEURONTIN) 100 MG capsule Take 1 capsule (100 mg total) by mouth at bedtime. 01/31/22   Jaynee Eagles, PA-C  topiramate (TOPAMAX) 50 MG tablet Take 2 tablets (100 mg total) by mouth 2 (two) times daily. 03/29/22   Melvenia Beam, MD      Allergies    Sulfa antibiotics and Compazine [prochlorperazine]    Review of Systems   Review of Systems  All other systems reviewed and are negative.   Physical Exam Updated Vital Signs BP 108/75   Pulse 67   Temp 98.4 F (36.9 C)   Resp 15   SpO2 100%  Physical Exam Vitals and nursing note reviewed.  Constitutional:      General: She is not in acute distress.    Appearance: Normal appearance. She is well-developed. She is not  toxic-appearing.  HENT:     Head: Normocephalic and atraumatic.  Eyes:     General: Lids are normal.     Conjunctiva/sclera: Conjunctivae normal.     Pupils: Pupils are equal, round, and reactive to light.  Neck:     Thyroid: No thyroid mass.     Trachea: No tracheal deviation.  Cardiovascular:     Rate and Rhythm: Normal rate and regular rhythm.     Heart sounds: Normal heart sounds. No murmur heard.    No gallop.  Pulmonary:     Effort: Pulmonary effort is normal. No respiratory distress.     Breath sounds: Normal breath sounds. No stridor. No decreased breath sounds, wheezing, rhonchi or rales.  Abdominal:     General: There is no distension.     Palpations: Abdomen is soft.     Tenderness: There is no abdominal tenderness. There is no rebound.  Musculoskeletal:        General: No tenderness. Normal range of motion.     Cervical back: Normal range of motion and neck supple.  Skin:    General: Skin is warm and dry.     Findings: No abrasion or rash.  Neurological:     Mental Status: She is alert and oriented to person, place, and time. Mental status is at baseline.     GCS: GCS eye subscore is  4. GCS verbal subscore is 5. GCS motor subscore is 6.     Cranial Nerves: No dysarthria or facial asymmetry.     Sensory: No sensory deficit.     Motor: Motor function is intact.     Comments: Right-sided facial decree sensation.  Facial symmetry appreciated.  Psychiatric:        Attention and Perception: Attention normal.        Mood and Affect: Affect is blunt.        Speech: Speech normal.        Behavior: Behavior is withdrawn.     ED Results / Procedures / Treatments   Labs (all labs ordered are listed, but only abnormal results are displayed) Labs Reviewed  CBC WITH DIFFERENTIAL/PLATELET  COMPREHENSIVE METABOLIC PANEL    EKG EKG Interpretation  Date/Time:  Wednesday March 30 2022 12:33:27 EST Ventricular Rate:  65 PR Interval:  165 QRS Duration: 91 QT  Interval:  400 QTC Calculation: 416 R Axis:   70 Text Interpretation: Sinus rhythm Abnormal R-wave progression, early transition ST elevation, consider inferior injury Confirmed by Lacretia Leigh ([id number]) on 03/30/2022 1:47:56 PM  Radiology No results found.  Procedures Procedures    Medications Ordered in ED Medications  lactated ringers infusion (has no administration in time range)    ED Course/ Medical Decision Making/ A&P                             Medical Decision Making Amount and/or Complexity of Data Reviewed Labs: ordered. Radiology: ordered. ECG/medicine tests: ordered.  Risk Prescription drug management.   Patient is EKG per interpretation shows no acute ischemic findings.  Patient MRI of brain was negative for any acute findings.  Discussed with neurology who will come and see.        Final Clinical Impression(s) / ED Diagnoses Final diagnoses:  None    Rx / DC Orders ED Discharge Orders     None         Lacretia Leigh, MD 03/30/22 1513

## 2022-03-30 NOTE — Telephone Encounter (Signed)
Pt husband is calling back. Stated he needs to hear from the nurse on what to do with his wife. Stated Dr. Jaynee Eagles told him not to go to the Emergency Room but to call her first. I informed him she is with pt but I would forward another message to the nurse.
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
## 2022-03-30 NOTE — Telephone Encounter (Signed)
Husband is asking that the dr takes a look at patient notes because this in the 2nd time she has been to the hospital since her visit on 1/26. He is asking that he dr give them a call. Please advise

## 2022-03-31 ENCOUNTER — Encounter: Payer: Self-pay | Admitting: Cardiovascular Disease

## 2022-03-31 ENCOUNTER — Ambulatory Visit: Payer: BC Managed Care – PPO | Admitting: Neurology

## 2022-03-31 DIAGNOSIS — R4182 Altered mental status, unspecified: Secondary | ICD-10-CM

## 2022-03-31 NOTE — Procedures (Signed)
    History:  47 year old woman with seizure like activity   EEG classification: Awake and drowsy  Description of the recording: The background rhythms of this recording consists of a fairly well modulated medium amplitude alpha rhythm of 9 Hz that is reactive to eye opening and closure. Present in the anterior head region is a 15-20 Hz beta activity. Photic stimulation was performed, did not show any abnormalities. Hyperventilation was also performed, did not show any abnormalities. Drowsiness was manifested by background fragmentation. No abnormal epileptiform discharges seen during this recording. There was no focal slowing. There were no electrographic seizure identified.   Abnormality: None   Impression: This is a normal EEG recorded while drowsy and awake. No evidence of interictal epileptiform discharges. Normal EEGs, however, do not rule out epilepsy.    Alric Ran, MD [name]

## 2022-03-31 NOTE — Telephone Encounter (Signed)
error 

## 2022-04-01 ENCOUNTER — Encounter: Payer: Self-pay | Admitting: Neurology

## 2022-04-01 DIAGNOSIS — H53121 Transient visual loss, right eye: Secondary | ICD-10-CM | POA: Diagnosis not present

## 2022-04-01 NOTE — Telephone Encounter (Signed)
Called patient husband, LVM advised of message below from MD.  Left call back number.

## 2022-04-03 ENCOUNTER — Telehealth: Payer: Self-pay | Admitting: Neurology

## 2022-04-03 NOTE — Telephone Encounter (Signed)
Do I have any openings in the next few weeks for a follow up with patient sooner than 3/12? thanks

## 2022-04-03 NOTE — Telephone Encounter (Signed)
Angel/ Jillian, do I have any room for a sooner appointment with patient? We could do it over video to talk about what happened at her last emergency room visit (pod 4 fyi)

## 2022-04-04 ENCOUNTER — Telehealth: Payer: BC Managed Care – PPO

## 2022-04-04 NOTE — Telephone Encounter (Signed)
Pt called needing to speak to the MD regarding her being able to drive. Pt is stating that she needs to speak to her as soon as possible. Please advise.

## 2022-04-04 NOTE — Telephone Encounter (Signed)
Dr Jaynee Eagles sent patient a mychart message.

## 2022-04-04 NOTE — Telephone Encounter (Signed)
Scheduled for next available of 05/02/22 and added to wait list.

## 2022-04-04 NOTE — Telephone Encounter (Signed)
Addressed in previous phone note.  

## 2022-04-05 ENCOUNTER — Telehealth: Payer: Self-pay | Admitting: Neurology

## 2022-04-05 DIAGNOSIS — F4312 Post-traumatic stress disorder, chronic: Secondary | ICD-10-CM | POA: Diagnosis not present

## 2022-04-05 DIAGNOSIS — F411 Generalized anxiety disorder: Secondary | ICD-10-CM | POA: Diagnosis not present

## 2022-04-05 NOTE — Telephone Encounter (Signed)
Because she just started having "episodes" if you tell me what time she comes I'll walk out to the bus and tell them to add it thanks

## 2022-04-05 NOTE — Telephone Encounter (Signed)
We also now have an opening on Monday 2/12 we will call to get her scheduled.

## 2022-04-05 NOTE — Telephone Encounter (Signed)
Yes she needs with and without and seizure protocol as well can you get her set up please?

## 2022-04-05 NOTE — Telephone Encounter (Signed)
Pt is calling. Requesting a call back. Stated someone will call her back today.

## 2022-04-05 NOTE — Telephone Encounter (Signed)
I just called this patient to schedule her MRI and said said she had to hang up because "she's having an episode right now" FYI-not sure what that means.

## 2022-04-05 NOTE — Telephone Encounter (Signed)
Spoke with the patient. She has been scheduled for both MRI on 2/7 and appt with Dr Jaynee Eagles on Monday.

## 2022-04-05 NOTE — Telephone Encounter (Signed)
About an hour after this message, the patient called me back. She said the "episode" she was experiencing was feeling spacey and not aware of the time and place. She said this has been happening a lot and she can't think straight. She said she feels like she is going crazy and no one can figure out what is wrong with her. She is wondering if she needs the MRI brain w.wo contrast that Dr. Jaynee Eagles ordered on 1/30 still since she had a MRI brain wo contrast at the ED on 1/31. I talked with Romelle Starcher, she is going to discuss with Dr. Jaynee Eagles and call the patient back today and try to bring her in sooner than March 4 for an appointment. If she does still need the MRI brain w/wo contrast I can schedule her at our office for tomorrow 2/7.
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
## 2022-04-06 ENCOUNTER — Encounter: Payer: Self-pay | Admitting: Family Medicine

## 2022-04-06 ENCOUNTER — Ambulatory Visit: Payer: BC Managed Care – PPO | Admitting: Family Medicine

## 2022-04-06 ENCOUNTER — Ambulatory Visit: Payer: BC Managed Care – PPO

## 2022-04-06 VITALS — BP 118/70 | HR 77 | Temp 98.2°F | Ht 68.0 in | Wt 175.6 lb

## 2022-04-06 DIAGNOSIS — R531 Weakness: Secondary | ICD-10-CM

## 2022-04-06 DIAGNOSIS — R4182 Altered mental status, unspecified: Secondary | ICD-10-CM | POA: Diagnosis not present

## 2022-04-06 DIAGNOSIS — R200 Anesthesia of skin: Secondary | ICD-10-CM

## 2022-04-06 DIAGNOSIS — R410 Disorientation, unspecified: Secondary | ICD-10-CM | POA: Diagnosis not present

## 2022-04-06 DIAGNOSIS — G2581 Restless legs syndrome: Secondary | ICD-10-CM

## 2022-04-06 DIAGNOSIS — G459 Transient cerebral ischemic attack, unspecified: Secondary | ICD-10-CM | POA: Diagnosis not present

## 2022-04-06 DIAGNOSIS — R413 Other amnesia: Secondary | ICD-10-CM | POA: Diagnosis not present

## 2022-04-06 MED ORDER — GADOBENATE DIMEGLUMINE 529 MG/ML IV SOLN
15.0000 mL | Freq: Once | INTRAVENOUS | Status: AC | PRN
  Administered 2022-04-06: 15 mL via INTRAVENOUS

## 2022-04-06 MED ORDER — GABAPENTIN 300 MG PO CAPS: 300.0000 mg | ORAL_CAPSULE | Freq: Every day | ORAL | 0 refills | Status: DC

## 2022-04-06 NOTE — Assessment & Plan Note (Signed)
Sent a referral to [facility] at patients and spouses request for a second opinion. She is having an MR brain with contrast later today that was ordered by Dr. Ahern with [facility]. Recommend patient go back to ED if symptoms become worse or change significantly.  

## 2022-04-06 NOTE — Patient Instructions (Signed)
It was a pleasure to meet you and your husband today. I look forward to caring for you. A referral has been sent to [facility] for a second opinion. INCREASED Gabapentin to 300mg  from 100mg  at bedtime.  Follow up in 3 weeks.  Recommend if symptoms become worse or change significantly, please go to the closes ED.  If you have any questions or concerns, please send a message through [place].  Take Care!

## 2022-04-06 NOTE — Assessment & Plan Note (Signed)
Increased Gabapentin to 300mg  at bedtime from 100mg . Discussed about possible side effects of medication, such as dizziness and peripheral edema.

## 2022-04-06 NOTE — Assessment & Plan Note (Signed)
Sent a referral to [facility] at patients and spouses request for a second opinion. She is having an MR brain with contrast later today that was ordered by Dr. Jaynee Eagles with [facility]. Recommend patient go back to ED if symptoms become worse or change significantly.

## 2022-04-06 NOTE — Progress Notes (Signed)
New Patient Office Visit  Subjective    Patient ID: Kathryn Brown, female    DOB: 11/14/1975  Age: 47 y.o. MRN: [id number]  CC:  Chief Complaint  Patient presents with   Establish Care    In the last two weeks she has been to the ER twice for signs of a stroke. Ct scan and MRI showed no signs. She was dx with restless leg syndrome and they would like to discuss that and would like a second opinion from [place] [facility]. She was taking gabapentin for it which helped but she was on the lowest dose and was told that she can go higher   Memory Loss    Having some issues losing her thought process and losing track of time. Has dizzy spells, needs help forming her sentences, repeating herself a lot. She did have a fall the Sunday after Thanksgiving, she fell down stairs. She doesn't remember falling, it was reported that she did not hit her head.   Depression    PHQ-9 score was 20    HPI Kathryn Brown presents to establish care with new provider, discuss about neurological concerns, and restless leg syndrome.   Patient reports she has an appointment with [facility] with Dr. Viona Gilmore. O'Neal later this month.   Patient reports she initially fell 2 days after Thanksgiving down some stairs. She denies hitting her head, but had an injury to right foot and ankle. On 12/04/02023, she was seen at [facility] for her right foot/ankle injury. She was diagnosed with a sprain of the right foot and ankle. Since then she has experienced several other symptoms: such as dropping items in her right hand, increase in severity of losing track of time, having memory loss, waking up constantly during the night, brief seconds she will think she is in her past, altered vision with seeing halos around objects with/without prescription glasses, and episodes of blacking out with chest pain, having speech problems. With the combination of her symptoms she feels like she is slowly losing her mind.   She was seen at [facility] ED at [facility] for prolonged headache with neurological complaints on 03/26/2022. She had a CTA, CTV, and labs that were overall reassuring. She was diagnosed with a complicated migraine and given a headache cocktail with relief. She was referred to [facility].  On 03/29/2022, she had her initial visit with Dr. Loni Muse.  Jaynee Eagles with [facility].  A MR brain was ordered, labs completed, EEG ordered, echocardiogram complete bubble study ordered, and a referral to sleep study. Also, ASA and Topiramate was initiated for migraine prevention. She has since stopped taking Topiramate due to SI and Dr. Jaynee Eagles is aware. On 03/30/2022, she went to [facility] ED at [facility] for near syncope and seizure-like activity. She had a negative MRI at this visit.  Patients EEG returned normal. She is scheduled later today for an MRI head with contrast and has an appointment with Dr. Jaynee Eagles on 04/11/2022 at 13:00.  Patient and spouse, Aaron Edelman, are very concerned about patients symptoms and does not feel like their concerns are being addressed in a timely manner. They are in office today requesting for a second opinion to [facility].   Patient reports she had a recent eye exam last week with [facility] care.   Restless Leg Syndrome: Gabapentin was started initially by previous PCP for restless leg only in right leg. It occurs every night; has spasms at night  that she is unable to control; She is currently taking 100 mg at bedtime every night; been taking it for a couple of years. She requesting to see if medication can be increased.   Outpatient Encounter Medications as of 04/06/2022  Medication Sig   aspirin (ASPIRIN CHILDRENS) 81 MG chewable tablet Chew 1 tablet (81 mg total) by mouth daily.   gabapentin (NEURONTIN) 100 MG capsule Take 1 capsule (100 mg total) by mouth at bedtime.   topiramate (TOPAMAX) 50 MG tablet Take 2 tablets (100 mg total) by mouth 2 (two) times daily.   No  facility-administered encounter medications on file as of 04/06/2022.    Past Medical History:  Diagnosis Date   Chest pain    Heart murmur    Heart palpitations    History of stomach ulcers    as a child   MVP (mitral valve prolapse) 10/30/2011    Past Surgical History:  Procedure Laterality Date   TONSILECTOMY, ADENOIDECTOMY, BILATERAL MYRINGOTOMY AND TUBES     TONSILLECTOMY      Family History  Problem Relation Age of Onset   Heart disease Mother    Hypertension Mother    Heart disease Father    Hypertension Father    Breast cancer Neg Hx    Migraines Neg Hx     Social History   Socioeconomic History   Marital status: Married    Spouse name: Not on file   Number of children: Not on file   Years of education: Not on file   Highest education level: Not on file  Occupational History   Occupation: Youth worker  Tobacco Use   Smoking status: Former    Packs/day: 0.50    Years: 10.00    Total pack years: 5.00    Types: Cigarettes   Smokeless tobacco: Never  Vaping Use   Vaping Use: Every day   Substances: Flavoring  Substance and Sexual Activity   Alcohol use: Yes    Alcohol/week: 7.0 standard drinks of alcohol    Types: 7 Glasses of wine per week   Drug use: No   Sexual activity: Yes    Birth control/protection: Other-see comments    Comment: spouse-vasectomy  Other Topics Concern   Not on file  Social History Narrative   Not on file   Social Determinants of Health   Financial Resource Strain: Not on file  Food Insecurity: Not on file  Transportation Needs: Not on file  Physical Activity: Not on file  Stress: Not on file  Social Connections: Not on file  Intimate Partner Violence: Not on file    ROS See HPI above      Objective    BP 118/70   Pulse 77   Temp 98.2 F (36.8 C)   Ht 5\' 8"  (1.727 m)   Wt 175 lb 9.6 oz (79.7 kg)   SpO2 98%   BMI 26.70 kg/m   Physical Exam Vitals reviewed.  Constitutional:      General: She is  not in acute distress.    Appearance: Normal appearance. She is not ill-appearing or toxic-appearing.  HENT:     Head: Normocephalic and atraumatic.  Eyes:     General:        Right eye: No discharge.        Left eye: No discharge.     Extraocular Movements: Extraocular movements intact.     Right eye: No nystagmus.     Left eye: No nystagmus.  Conjunctiva/sclera: Conjunctivae normal.     Pupils: Pupils are equal, round, and reactive to light.  Cardiovascular:     Rate and Rhythm: Normal rate and regular rhythm.     Heart sounds: Normal heart sounds. No murmur heard.    No friction rub. No gallop.  Pulmonary:     Effort: Pulmonary effort is normal. No respiratory distress.     Breath sounds: Normal breath sounds.  Musculoskeletal:        General: Normal range of motion.  Skin:    General: Skin is warm and dry.     Comments: Healing scar noted on right lateral ankle from reported fall a few days after Thanksgiving  Neurological:     General: No focal deficit present.     Mental Status: She is alert and oriented to person, place, and time. Mental status is at baseline.     Cranial Nerves: Cranial nerves 2-12 are intact.     Sensory: Sensation is intact.     Motor: No weakness.  Psychiatric:        Mood and Affect: Mood normal.        Behavior: Behavior normal.        Judgment: Judgment normal.     Comments: When speaking, she often forgets what she was saying during a conversation        Assessment & Plan:  After appointment, spent time reviewing medical records in depth from her initial visit with [facility] on 01/31/2022 and to present time. On her next appointment, need to follow up with discussing treatment about her elevated lipids and decreased Ferritin level if Dr. Jaynee Eagles does not address this on 02/12 office visit; depression; and pap smear for her health maintenance.  There are no diagnoses linked to this encounter.  No follow-ups on file.   Valarie Merino, NP

## 2022-04-07 ENCOUNTER — Telehealth (INDEPENDENT_AMBULATORY_CARE_PROVIDER_SITE_OTHER): Payer: BC Managed Care – PPO | Admitting: Family

## 2022-04-07 ENCOUNTER — Encounter: Payer: Self-pay | Admitting: Family

## 2022-04-07 ENCOUNTER — Telehealth: Payer: Self-pay | Admitting: Family Medicine

## 2022-04-07 DIAGNOSIS — U071 COVID-19: Secondary | ICD-10-CM | POA: Diagnosis not present

## 2022-04-07 MED ORDER — BENZONATATE 100 MG PO CAPS: 100.0000 mg | ORAL_CAPSULE | Freq: Three times a day (TID) | ORAL | 0 refills | Status: DC | PRN

## 2022-04-07 MED ORDER — GUAIFENESIN-CODEINE 100-10 MG/5ML PO SYRP: 5.0000 mL | ORAL_SOLUTION | Freq: Three times a day (TID) | ORAL | 0 refills | Status: DC | PRN

## 2022-04-07 NOTE — Telephone Encounter (Signed)
Caller name: JAKYRAH HOLLADAY  On DPR?: Yes  Call back number: [phone] (mobile)  Provider they see: Evangeline Gula, NP  Reason for call:  Patient had a NP with [facility] yesterday. Patient called stating that she tested positive for Covid. Patient did have her mask on yesterday. Patient is asking for medication for this. Do she need to schedule another for medication?

## 2022-04-07 NOTE — Telephone Encounter (Signed)
LM for spouse

## 2022-04-07 NOTE — Progress Notes (Signed)
MyChart Video Visit    Virtual Visit via Video Note   This format is felt to be most appropriate for this patient at this time. Physical exam was limited by quality of the video and audio technology used for the visit. CMA was able to get the patient set up on a video visit.  Patient location: Home. Patient and provider in visit Provider location: Office  I discussed the limitations of evaluation and management by telemedicine and the availability of in person appointments. The patient expressed understanding and agreed to proceed.  Visit Date: 04/07/2022  Today's healthcare provider: Jeanie Sewer, NP     Subjective:   Patient ID: Kathryn Brown, female    DOB: 19-Oct-1975, 47 y.o.   MRN: [id number]  Chief Complaint  Patient presents with   Covid Positive    Tested positive last night. Symptoms include runny nose, cough with clear to yellow mucus, chills and headaches, Present since Saturday. Has tried tylenol , advil and halls which did help symptoms.     HPI URI sx:  reports worst sx is runny nose, irritated throat causing cough. Using cough drops, but not helping, worse when lying down. Reports she has restless legs and when she takes Nyquil which worsens her sx. Sx first started 5 days ago, tested positive for Covid last night. Also reports headaches, advil helps some with this;   Assessment & Plan:   Problem List Items Addressed This Visit   None Visit Diagnoses     COVID-19    -  Primary Pt out of tx window for antiviral. Sending tessalon perles and cough syrup for night time use, no driving if taken during day.  Advised of CDC guidelines for masking if out in public. OK to continue taking OTC sinus or pain meds. Encouraged to monitor & notify office of any worsening symptoms: increased shortness of breath, weakness, and signs of dehydration. Instructed to rest and hydrate well.     Relevant Medications   benzonatate (TESSALON) 100 MG capsule    guaiFENesin-codeine (ROBITUSSIN AC) 100-10 MG/5ML syrup   Past Medical History:  Diagnosis Date   Chest pain    Heart murmur    Heart palpitations    History of stomach ulcers    as a child   MVP (mitral valve prolapse) 10/30/2011    Past Surgical History:  Procedure Laterality Date   TONSILECTOMY, ADENOIDECTOMY, BILATERAL MYRINGOTOMY AND TUBES     TONSILLECTOMY      Outpatient Medications Prior to Visit  Medication Sig Dispense Refill   aspirin (ASPIRIN CHILDRENS) 81 MG chewable tablet Chew 1 tablet (81 mg total) by mouth daily.     gabapentin (NEURONTIN) 300 MG capsule Take 1 capsule (300 mg total) by mouth at bedtime. 90 capsule 0   No facility-administered medications prior to visit.    Allergies  Allergen Reactions   Sulfa Antibiotics Anaphylaxis   Compazine [Prochlorperazine]     Paradoxical reaction.   Topiramate Other (See Comments)    Suicidal thoughts       Objective:   Physical Exam Vitals and nursing note reviewed.  Constitutional:      General: Pt is not in acute distress.    Appearance: Normal appearance.  HENT:     Head: Normocephalic.  Pulmonary:     Effort: No respiratory distress.  Musculoskeletal:     Cervical back: Normal range of motion.  Skin:    General: Skin is dry.     Coloration: Skin is  not pale.  Neurological:     Mental Status: Pt is alert and oriented to person, place, and time.  Psychiatric:        Mood and Affect: Mood normal.   There were no vitals taken for this visit.  Wt Readings from Last 3 Encounters:  04/06/22 175 lb 9.6 oz (79.7 kg)  03/29/22 175 lb (79.4 kg)  01/31/22 150 lb (68 kg)      I discussed the assessment and treatment plan with the patient. The patient was provided an opportunity to ask questions and all were answered. The patient agreed with the plan and demonstrated an understanding of the instructions.   The patient was advised to call back or seek an in-person evaluation if the symptoms worsen or  if the condition fails to improve as anticipated.  Jeanie Sewer, NP [place] [phone] (phone) [phone] (fax)  [place]

## 2022-04-07 NOTE — Telephone Encounter (Signed)
Pt will need a virtual as this was not a topic discussed yesterday

## 2022-04-07 NOTE — Telephone Encounter (Signed)
Called and number did not go through, will try again to schedule

## 2022-04-11 ENCOUNTER — Encounter: Payer: Self-pay | Admitting: Neurology

## 2022-04-11 ENCOUNTER — Ambulatory Visit: Payer: BC Managed Care – PPO | Admitting: Neurology

## 2022-04-11 VITALS — BP 107/71 | HR 73 | Ht 68.0 in | Wt 177.4 lb

## 2022-04-11 DIAGNOSIS — R4789 Other speech disturbances: Secondary | ICD-10-CM

## 2022-04-11 DIAGNOSIS — E519 Thiamine deficiency, unspecified: Secondary | ICD-10-CM

## 2022-04-11 DIAGNOSIS — R404 Transient alteration of awareness: Secondary | ICD-10-CM | POA: Diagnosis not present

## 2022-04-11 DIAGNOSIS — R259 Unspecified abnormal involuntary movements: Secondary | ICD-10-CM

## 2022-04-11 DIAGNOSIS — E559 Vitamin D deficiency, unspecified: Secondary | ICD-10-CM | POA: Diagnosis not present

## 2022-04-11 DIAGNOSIS — R569 Unspecified convulsions: Secondary | ICD-10-CM

## 2022-04-11 DIAGNOSIS — E538 Deficiency of other specified B group vitamins: Secondary | ICD-10-CM | POA: Diagnosis not present

## 2022-04-11 NOTE — Progress Notes (Addendum)
[name]    Provider:  Dr Jaynee Eagles Requesting Provider: No ref. provider found Primary Care Provider:  Evangeline Gula, NP  CC:  episodes  04/11/2022: She is having episodes, EEG routine was normal and MRI x 2 was normal. She got sick last week, she was coughing at night and was prescribed for coughing. She had a feeling in left leg started there in bed and it felt like she couldn't stop it (not like RLS) like a convulsion, involuntary movement, it traveled to both arms left > right. Like a clenching. She was "30%" aware she remembered the episode, it woke her up. Her arm felt like a cramp on the left she couldn't unclench her hand. A really bad cramp and "let go" She took the codeine again and it happened. It has happened since she stopped taking the codeine. Husband is here and had noticed that she is pausing a lot inbetween and "going places" she just spaces out, she has spaced out for 4 years but worsening. As a child she saw a child counselor for seeing visions and she sees then when she spaces but not when falling asleep or getting up from sleep.She saw someone in the MRI with her and she was aware it wasn't true. She has been "off the deep end" as far as emotional stability, feeling where stomach falls like on a roller coaster and then an anxious feeling. As a child she was labeled as learning disabled with dyslexia but never seizures. She took gabapentin for RLS not for seizures. These episodes are NOT in the setting of headaches, do NOT think they are complcated migraines. Episodes can't get words out. "Spacing out".  Daily episodes.  No other focal neurologic deficits, associated symptoms, inciting events or modifiable factors.  Patient complains of symptoms per HPI as well as the following symptoms: multiple neurologic episodes . Pertinent negatives and positives per HPI. All others negative  MRI was normal: reviewed and agree  03/30/2022: addendum: Ldl is elevated.  Should be less than 70 if we believe this was a TIA vs migraine. I would start you on cholesterol medicine or have your pcp start you. Your Ferritin is very low 37, we recommend > 70 as low ferritin can worsen restless leg syndrome. Your hgba1c is normal, no diabetes. Let me know if you would like to discuss with your pcp or have [place] treat you for the high LDL and recommend iron supplementation. See phone note  HPI:  Kathryn Brown is a 47 y.o. female here as requested by No ref. provider found for migraine.  She has a past medical history of abnormal uterine bleeding, loop electrical excision procedure, vitamin D deficiency, upper airway cough disease syndrome, dyspnea next exertion, mitral valve prolapse, chest pain, palpitations, heart murmurs, stomach ulcers, small pfo on CT confirmed by echo.  I reviewed notes from the emergency room she was just recently there for headache, visual changes, dizziness, speech difficulty, numbness, tingling,  headache, confusion.   She is here alone. She has had migraines. Top of the head, tight and throbbing/pulsating, sound sensitivity, +nausea, hurts to move, a quiet place helps, advil and alleve helped in the past, she is not very close to biological mother so unknown history, stress is "her life" and a few weeks ago she went through the worst case of stress she has every gone through, constant stress, woke up stressed and had numbness on the right side of the body, weakness right side of the  body, lost vision in the right eye for 30 minutes and when it came back she had spots and sloly dissipated never went to the left, did not know she has had a migraine, she has been foggy ever since, getting lost in a place she knew off, she remembers having a headache but not normal migraines. She had a migraine cocktail in the ED temporarily helped but had side effects. Had a paradoxical reaction to compazine, was given toradol and benadryl. Her speech is still affected. She still has  right numbness and feels like the right side of her face abnormal. In the past she has had visual changes before her migraines, bright lights, both eyes. >15 headache days a month. All are migrainous. At least 8 moderate to severe and can last 24 hours. No medication overuse. No loss of consciousness. She loses time. She forgets things that happen. Will get an EEG as well.   Reviewed notes, labs and imaging from outside physicians, which showed:    From a thorough evaluation and review of notes medications tried that can be used in migraine management include: Tylenol, Lexapro, gabapentin, Prozac, ibuprofen, ketorolac, meclizine, metoprolol, naproxen, Zofran, prednisone, Lyrica, Compazine, propranolol, zoloft,   03/26/2022: COMPARISON:  MR head without contrast 07/28/2016   FINDINGS: CT HEAD FINDINGS   Brain: No acute infarct, hemorrhage, or mass lesion is present. No significant white matter lesions are present. Deep brain nuclei are within normal limits.   The ventricles are of normal size. No significant extraaxial fluid collection is present.   The brainstem and cerebellum are within normal limits. Midline structures are within normal limits.   Vascular: No hyperdense vessel or unexpected calcification.   Skull: Calvarium is intact. No focal lytic or blastic lesions are present. No significant extracranial soft tissue lesion is present.   Sinuses/Orbits: The paranasal sinuses and mastoid air cells are clear. The globes and orbits are within normal limits.   Review of the MIP images confirms the above findings   CTA NECK FINDINGS   Aortic arch: The contrast bolus is delayed. Significant venous contrast obscures the proximal great vessels. Visualized aortic arch is within normal limits.   Right carotid system: The right common carotid artery is within normal limits. Bifurcation is normal. Cervical right ICA is normal.   Left carotid system: The left common carotid artery is  within normal limits. Bifurcation is unremarkable. Cervical left ICA is normal.   Vertebral arteries: The left vertebral artery is the dominant vessel. Both vertebral arteries originate from the subclavian arteries without significant stenoses. No significant stenosis is present in either vertebral artery in the neck.   Skeleton: Vertebral body heights and alignment are normal. No acute or focal osseous lesions are present.   Other neck: Soft tissues the neck are otherwise unremarkable. Salivary glands are within normal limits. Thyroid is normal. No significant adenopathy is present. No focal mucosal or submucosal lesions are present.   Upper chest: Mild dependent atelectasis is present. The lungs are otherwise clear.   Review of the MIP images confirms the above findings   CTA HEAD FINDINGS   Anterior circulation: The internal carotid arteries are within normal limits through the ICA termini bilaterally. The A1 and M1 segments are normal. The anterior communicating artery is patent. The MCA bifurcations are within normal limits bilaterally. There is some attenuation of distal ACA and MCA branch vessels which is likely due to timing of the contrast bolus. No significant proximal stenosis or occlusion is present.  Posterior circulation: Left PICA origin is visualized and normal. AICA vessels are present bilaterally. The vertebrobasilar junction basilar artery normal. Both posterior cerebral arteries originate from basilar tip. Similar attenuation of distal PCA branch vessels is present. Proximal PCA vessels are normal.   Venous sinuses: Dural sinuses are not well visualized due to the early timing of the contrast bolus. A separate CT venogram was performed.   Anatomic variants: None   Review of the MIP images confirms the above findings   IMPRESSION: 1. Normal noncontrast CT of the head. 2. Normal CTA of the neck. 3. Normal CTA circle-of-Willis without significant  proximal stenosis, aneurysm, or branch vessel occlusion. 4. The study is somewhat degraded in the evaluation of distal vessels due to timing of the contrast bolus.    Review of Systems: Patient complains of symptoms per HPI as well as the following symptoms migraine. Pertinent negatives and positives per HPI. All others negative.   Social History   Socioeconomic History   Marital status: Married    Spouse name: Not on file   Number of children: Not on file   Years of education: Not on file   Highest education level: Not on file  Occupational History   Occupation: Youth worker  Tobacco Use   Smoking status: Former    Packs/day: 0.50    Years: 10.00    Total pack years: 5.00    Types: Cigarettes   Smokeless tobacco: Never   Tobacco comments:    Little vape   Vaping Use   Vaping Use: Every day   Substances: Flavoring  Substance and Sexual Activity   Alcohol use: Yes    Alcohol/week: 7.0 standard drinks of alcohol    Types: 7 Glasses of wine per week   Drug use: No   Sexual activity: Yes    Birth control/protection: Other-see comments    Comment: spouse-vasectomy  Other Topics Concern   Not on file  Social History Narrative   Not on file   Social Determinants of Health   Financial Resource Strain: Not on file  Food Insecurity: Not on file  Transportation Needs: Not on file  Physical Activity: Not on file  Stress: Not on file  Social Connections: Not on file  Intimate Partner Violence: Not on file    Family History  Problem Relation Age of Onset   Heart disease Mother    Hypertension Mother    Heart disease Father    Hypertension Father    Breast cancer Neg Hx    Migraines Neg Hx     Past Medical History:  Diagnosis Date   Chest pain    Heart murmur    Heart palpitations    History of stomach ulcers    as a child   MVP (mitral valve prolapse) 10/30/2011    Patient Active Problem List   Diagnosis Date Noted   Restless leg syndrome,  uncontrolled 04/06/2022   Altered mental status, unspecified 04/06/2022   Memory loss of unknown cause 04/06/2022   Abnormal uterine bleeding (AUB) 09/21/2021   History of loop electrical excision procedure (LEEP) 09/21/2021   Vitamin D deficiency 08/26/2019   MVP (mitral valve prolapse) 10/30/2011    Past Surgical History:  Procedure Laterality Date   TONSILECTOMY, ADENOIDECTOMY, BILATERAL MYRINGOTOMY AND TUBES     TONSILLECTOMY      Current Outpatient Medications  Medication Sig Dispense Refill   aspirin (ASPIRIN CHILDRENS) 81 MG chewable tablet Chew 1 tablet (81 mg total) by mouth daily.  gabapentin (NEURONTIN) 300 MG capsule Take 1 capsule (300 mg total) by mouth at bedtime. 90 capsule 0   guaiFENesin-codeine (ROBITUSSIN AC) 100-10 MG/5ML syrup Take 5 mLs by mouth 3 (three) times daily as needed for cough. (Patient taking differently: Take 5 mLs by mouth as needed for cough.) 118 mL 0   No current facility-administered medications for this visit.    Allergies as of 04/11/2022 - Review Complete 04/11/2022  Allergen Reaction Noted   Sulfa antibiotics Anaphylaxis 05/31/2010   Compazine [prochlorperazine]  03/29/2022   Topiramate Other (See Comments) 04/06/2022    Vitals: BP 107/71   Pulse 73   Ht 5' 8"$  (1.727 m)   Wt 177 lb 6.4 oz (80.5 kg)   BMI 26.97 kg/m  Last Weight:  Wt Readings from Last 1 Encounters:  04/11/22 177 lb 6.4 oz (80.5 kg)   Last Height:   Ht Readings from Last 1 Encounters:  04/11/22 5' 8"$  (1.727 m)     Physical exam: Exam: Gen: NAD, conversant, well nourised, well groomed                     CV: RRR, no MRG. No Carotid Bruits. No peripheral edema, warm, nontender Eyes: Conjunctivae clear without exudates or hemorrhage  Neuro: Detailed Neurologic Exam  Speech:    Speech is normal; fluent and spontaneous with normal comprehension.  Cognition:    The patient is oriented to person, place, and time;     recent and remote memory intact;      language fluent;     normal attention, concentration,     fund of knowledge Cranial Nerves:    The pupils are equal, round, and reactive to light. The fundi are normal and spontaneous venous pulsations are present. Visual fields are full to finger confrontation. Extraocular movements are intact. Trigeminal sensation is intact and the muscles of mastication are normal. The face is symmetric. The palate elevates in the midline. Hearing intact. Voice is normal. Shoulder shrug is normal. The tongue has normal motion without fasciculations.   Coordination:    Normal finger to nose and heel to shin. Normal rapid alternating movements.   Gait:    Heel-toe and tandem gait are normal.   Motor Observation:    No asymmetry, no atrophy, and no involuntary movements noted. Tone:    Normal muscle tone.    Posture:    Posture is normal. normal erect    Strength:    Strength is V/V in the upper and lower limbs.      Sensation: intact to LT     Reflex Exam:  DTR's:    Deep tendon reflexes in the upper and lower extremities are normal bilaterally.   Toes:    The toes are downgoing bilaterally.   Clonus:    Clonus is absent.   Assessment/Plan:  Patient with acute onset right-sided weakness and right eye vision loss was seen and diagnosed with complicatedmigraines. Since then having multiple episodes, been to the ED twice in last 2 weeks with unknown episodes;  She is having multiple other non-stereotyped issues such as hallucinations, delusions, "spacing out", not getting words out, confusion episodes, repetitive questions, word finding problems, sensory problems on the right side of face, vision changes, episodes of involuntary left leg and bilateral arm movement. Seen in the ED multiple times, offered long-term EEG monitoring by [facility] but they couldn;t stay in the hospital had already been there 10 hours and declined (see Dr. Lyn Records notes from 03/30/2022).  Neuro exam is normal.   Doesn't  correspond with TIA or stroke or "complicated migraine" may be seizures or PNES or psychiatric sue to stress?   Differential: seizure vs PNES?   Multiple neurologic symptoms - needs long-term eeg.    Evaluation to date: - Mri of the brain w/wo contrast - normal x 2 with seizure, stroke, MS protocols - Routine EEG - normal but did not have an episode - Test atrial fibrillation - 30 day heart monitor pending - Sleep apnea testing pending. Will take naps on weekends, snore, tired during the day, recent possible TIA/Stroke needs sleep evaluation also has very bad RLS and kicking all night - appt sleep doctor - also having hallucintaions/deltions could this be REM Sleep intrusion? Narcolepsy? - Echo with bubble study - seeing cardiologist in the next few weeks - Hgba1c 5.1 and cholesterol panel ldl 116 want it < 70 if TIA - Blood work - b12, b1, vitamin D - Topiramate made her suicidal - answered questions - ferritin was 37, taking iron for RLS - orthostatic VS checked, not orthostatic  Orders Placed This Encounter  Procedures   B12 and Folate Panel   Methylmalonic acid, serum   Vitamin B1   Vitamin D, 25-hydroxy   Ambulatory referral to Neurology     Cc: No ref. provider found,  Evangeline Gula, NP  Sarina Ill, MD  [facility] [address] [place], [place] [address]  Phone [phone] Fax [phone]  I spent 70 minutes of face-to-face and non-face-to-face time with patient on the  1. Seizure-like activity ([place])   2. Altered awareness, transient   3. Word finding difficulty   4. Involuntary movements   5. Staring episodes   6. screen for Vitamin B12 deficiency   7. screen for Vitamin B1 deficiency   8. Vitamin D deficiency    diagnosis.  This included previsit chart review, lab review, study review, order entry, electronic health record documentation, patient education on the different diagnostic and therapeutic options,  counseling and coordination of care, risks and benefits of management, compliance, or risk factor reduction

## 2022-04-11 NOTE — Patient Instructions (Addendum)
Please call Levada Dy at [phone] if you don;t hear bacl about "EMU" Epilepsy monitoring unit dr. Zeb Comfort.  Per [facility] statutes, patients with seizures are not allowed to drive until they have been seizure-free for six months.    Use caution when using heavy equipment or power tools. Avoid working on ladders or at heights. Take showers instead of baths. Ensure the water temperature is not too high on the home water heater. Do not go swimming alone. Do not lock yourself in a room alone (i.e. bathroom). When caring for infants or small children, sit down when holding, feeding, or changing them to minimize risk of injury to the child in the event you have a seizure. Maintain good sleep hygiene. Avoid alcohol.    If patient has another seizure, call 911 and bring them back to the ED if: A.  The seizure lasts longer than 5 minutes.      B.  The patient doesn't wake shortly after the seizure or has new problems such as difficulty seeing, speaking or moving following the seizure C.  The patient was injured during the seizure D.  The patient has a temperature over 102 F (39C) E.  The patient vomited during the seizure and now is having trouble breathing  Per [facility] statutes, patients with seizures are not allowed to drive until they have been seizure-free for six months.  Other recommendations include using caution when using heavy equipment or power tools. Avoid working on ladders or at heights. Take showers instead of baths.  Do not swim alone.  Ensure the water temperature is not too high on the home water heater. Do not go swimming alone. Do not lock yourself in a room alone (i.e. bathroom). When caring for infants or small children, sit down when holding, feeding, or changing them to minimize risk of injury to the child in the event you have a seizure. Maintain good sleep hygiene. Avoid alcohol.  Also recommend adequate sleep, hydration, good diet and minimize  stress.  During the Seizure  - First, ensure adequate ventilation and place patients on the floor on their left side  Loosen clothing around the neck and ensure the airway is patent. If the patient is clenching the teeth, do not force the mouth open with any object as this can cause severe damage - Remove all items from the surrounding that can be hazardous. The patient may be oblivious to what's happening and may not even know what he or she is doing. If the patient is confused and wandering, either gently guide him/her away and block access to outside areas - Reassure the individual and be comforting - Call 911. In most cases, the seizure ends before EMS arrives. However, there are cases when seizures may last over 3 to 5 minutes. Or the individual may have developed breathing difficulties or severe injuries. If a pregnant patient or a person with diabetes develops a seizure, it is prudent to call an ambulance. - Finally, if the patient does not regain full consciousness, then call EMS. Most patients will remain confused for about 45 to 90 minutes after a seizure, so you must use judgment in calling for help. - Avoid restraints but make sure the patient is in a bed with padded side rails - Place the individual in a lateral position with the neck slightly flexed; this will help the saliva drain from the mouth and prevent the tongue from falling backward - Remove all nearby furniture and other hazards  from the area - Provide verbal assurance as the individual is regaining consciousness - Provide the patient with privacy if possible - Call for help and start treatment as ordered by the caregiver   fter the Seizure (Postictal Stage)  After a seizure, most patients experience confusion, fatigue, muscle pain and/or a headache. Thus, one should permit the individual to sleep. For the next few days, reassurance is essential. Being calm and helping reorient the person is also of importance.  Most  seizures are painless and end spontaneously. Seizures are not harmful to others but can lead to complications such as stress on the lungs, brain and the heart. Individuals with prior lung problems may develop labored breathing and respiratory distress.

## 2022-04-12 ENCOUNTER — Telehealth: Payer: Self-pay | Admitting: Neurology

## 2022-04-12 MED ORDER — VITAMIN D3 1.25 MG (50000 UT) PO CAPS: 50000.0000 [IU] | ORAL_CAPSULE | ORAL | 4 refills | Status: DC

## 2022-04-12 NOTE — Addendum Note (Signed)
Addended by: Evangeline Gula on: 04/12/2022 02:41 PM   Modules accepted: Orders

## 2022-04-12 NOTE — Telephone Encounter (Signed)
Pod 4: Patient's vitamin D is the lowest I think I have ever seen 5.4. She should start high dose vitamin D and then daily supplementation. If she is ok with it I will send in several months of 50,000 IU Vit D but her pcp will have to monitor this going forward and patient will have to take OTC Vitamin when the 50000 IU prescription ends. CC Kathryn Brown. Please let patient know see telephone note. If she agrees I can send in script and tell her about the association between the brain and vitamin D as below thanks Vitamin D:  It helps the body maintain calcium and phosphorus levels. These are important minerals, It plays a role in bone health. It reduces inflammation, It improves the body's defense system (immune system). But as far as brain goes, Adult vitamin D (AVD) deficiency has also been linked with schizophrenia, Alzheimer disease (AD), dementias, and adult disorders of cognition   If patient is ok with it I can send prescription

## 2022-04-12 NOTE — Telephone Encounter (Signed)
Spoke to pt and relayed results of her lab, low Vit D at 5.4.  if ok will prescribe vit d (high dose) for several months 50,000 units once weekly then pcp to monitor after that .  I relayed information about vit d as :  t helps the body maintain calcium and phosphorus levels. These are important minerals, It plays a role in bone health.  It reduces inflammation, It improves the body's defense system (immune system).  But as far as brain goes, Adult vitamin D (AVD) deficiency has also been linked with schizophrenia, Alzheimer disease (AD), dementias, and adult disorders of cognition.  Pt verbalized understanding. Appreciated call back.

## 2022-04-12 NOTE — Addendum Note (Signed)
Addended by: Brandon Melnick on: 04/12/2022 05:22 PM   Modules accepted: Orders

## 2022-04-12 NOTE — Addendum Note (Signed)
Addended by: Sarina Ill B on: 04/12/2022 05:25 PM   Modules accepted: Orders

## 2022-04-13 ENCOUNTER — Other Ambulatory Visit: Payer: BC Managed Care – PPO | Admitting: *Deleted

## 2022-04-13 ENCOUNTER — Encounter (HOSPITAL_COMMUNITY): Payer: Self-pay | Admitting: Neurology

## 2022-04-15 NOTE — Telephone Encounter (Signed)
Appointment 02/23 to discuss further.  Thanks!

## 2022-04-18 LAB — VITAMIN B1: Thiamine: 123.2 nmol/L (ref 66.5–200.0)

## 2022-04-18 LAB — B12 AND FOLATE PANEL
Folate: 6.1 ng/mL (ref >3.0)
Vitamin B-12: 409 pg/mL (ref 232–1245)

## 2022-04-18 LAB — METHYLMALONIC ACID, SERUM: Methylmalonic Acid: 135 nmol/L (ref 0–378)

## 2022-04-18 LAB — VITAMIN D 25 HYDROXY (VIT D DEFICIENCY, FRACTURES): Vit D, 25-Hydroxy: 5.4 ng/mL — ABNORMAL LOW (ref 30.0–100.0)

## 2022-04-19 NOTE — Progress Notes (Signed)
Cardiology Office Note:   Date:  04/22/2022  NAME:  Kathryn Brown    MRN: [id number] DOB:  January 14, 1976   PCP:  Evangeline Gula, NP  Cardiologist:  None  Electrophysiologist:  None   Referring MD: No ref. provider found   Chief Complaint  Patient presents with   Follow-up         History of Present Illness:   Kathryn Brown is a 47 y.o. female with a hx of anxiety who presents for follow-up. Seen in ER 03/30/2022 for complex migraine.  She reports for the last 4 weeks she has had a constellation of symptoms including right-sided numbness, concern for seizure, chest pain, shortness of breath and palpitations.  She was seen in the emergency room on 03/30/2022.  Brain MRI was negative.  She did undergo an EEG which was negative for seizure.  Neurology would like for her to complete a stay in the epilepsy ward.  She has a known history of PFO.  No documented stroke.  We discussed that she is unlikely to have atrial fibrillation or any negative consequence of her PFO.  Again she would have evidence of a stroke.  All of her laboratory data was normal in the hospital.  She really has only been found to be vitamin D deficient.  We did discuss that she is under a significant amount of stress.  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  She reports it has been stressful.  She presents with her husband.  There is no stress in the relationship.  She is not anemic.  She had a CT venogram that was negative.  CTA head and neck was negative for any large vessel occlusion.  She has had 2 brain MRIs that are normal.  Recent TSH 0.61.  All of her labs are really within limits.  Her CV examination is normal.  EKG demonstrates an RSR prime pattern.  This is benign.  All of her cardiac workup several years ago was negative.   Problem List PFO Normal coronary arteries   Past Medical History: Past Medical History:  Diagnosis Date   Chest pain    Heart murmur    Heart palpitations    History  of stomach ulcers    as a child   MVP (mitral valve prolapse) 10/30/2011    Past Surgical History: Past Surgical History:  Procedure Laterality Date   TONSILECTOMY, ADENOIDECTOMY, BILATERAL MYRINGOTOMY AND TUBES     TONSILLECTOMY      Current Medications: Current Meds  Medication Sig   aspirin (ASPIRIN CHILDRENS) 81 MG chewable tablet Chew 1 tablet (81 mg total) by mouth daily.   Cholecalciferol (VITAMIN D3) 1.25 MG (50000 UT) capsule Take 1 capsule (50,000 Units total) by mouth once a week.   gabapentin (NEURONTIN) 300 MG capsule Take 1 capsule (300 mg total) by mouth at bedtime.     Allergies:    Sulfa antibiotics, Compazine [prochlorperazine], and Topiramate   Social History: Social History   Socioeconomic History   Marital status: Married    Spouse name: Not on file   Number of children: Not on file   Years of education: Not on file   Highest education level: Not on file  Occupational History   Occupation: Youth worker  Tobacco Use   Smoking status: Former    Packs/day: 0.50    Years: 10.00    Total pack years: 5.00    Types: Cigarettes   Smokeless tobacco: Never  Tobacco comments:    Little vape   Vaping Use   Vaping Use: Every day   Substances: Flavoring  Substance and Sexual Activity   Alcohol use: Yes    Alcohol/week: 7.0 standard drinks of alcohol    Types: 7 Glasses of wine per week   Drug use: No   Sexual activity: Yes    Birth control/protection: Other-see comments    Comment: spouse-vasectomy  Other Topics Concern   Not on file  Social History Narrative   Not on file   Social Determinants of Health   Financial Resource Strain: Not on file  Food Insecurity: Not on file  Transportation Needs: Not on file  Physical Activity: Not on file  Stress: Not on file  Social Connections: Not on file     Family History: The patient's family history includes Heart disease in her father and mother; Hypertension in her father and mother. There  is no history of Breast cancer or Migraines.  ROS:   All other ROS reviewed and negative. Pertinent positives noted in the HPI.     EKGs/Labs/Other Studies Reviewed:   The following studies were personally reviewed by me today:  Recent Labs: 03/26/2022: TSH 0.603 03/30/2022: ALT 21; BUN 9; Creatinine, Ser 0.89; Hemoglobin 14.2; Platelets 259; Potassium 3.9; Sodium 137   Recent Lipid Panel    Component Value Date/Time   CHOL 227 (H) 03/29/2022 1145   TRIG 116 03/29/2022 1145   HDL 91 03/29/2022 1145   CHOLHDL 2.5 03/29/2022 1145   CHOLHDL 3 07/05/2021 1453   VLDL 18.6 07/05/2021 1453   LDLCALC 116 (H) 03/29/2022 1145    Physical Exam:   VS:  BP 118/76   Pulse 71   Ht '5\' 9"'$  (1.753 m)   Wt 176 lb 3.2 oz (79.9 kg)   LMP  (Within Weeks)   SpO2 99%   BMI 26.02 kg/m    Wt Readings from Last 3 Encounters:  04/22/22 176 lb 3.2 oz (79.9 kg)  04/11/22 177 lb 6.4 oz (80.5 kg)  04/06/22 175 lb 9.6 oz (79.7 kg)    General: Well nourished, well developed, in no acute distress Head: Atraumatic, normal size  Eyes: PEERLA, EOMI  Neck: Supple, no JVD Endocrine: No thryomegaly Cardiac: Normal S1, S2; RRR; no murmurs, rubs, or gallops Lungs: Clear to auscultation bilaterally, no wheezing, rhonchi or rales  Abd: Soft, nontender, no hepatomegaly  Ext: No edema, pulses 2+ Musculoskeletal: No deformities, BUE and BLE strength normal and equal Skin: Warm and dry, no rashes   Neuro: Alert and oriented to person, place, time, and situation, CNII-XII grossly intact, no focal deficits  Psych: Normal mood and affect   ASSESSMENT:   Kathryn Brown is a 47 y.o. female who presents for the following: 1. Other migraine without status migrainosus, not intractable   2. PFO (patent foramen ovale)     PLAN:   1. Other migraine without status migrainosus, not intractable 2. PFO (patent foramen ovale) -She presents with a constellation of symptoms including strokelike illness, seizure activity,  chest pain and shortness of breath.  She has had 2 brain MRI that are negative.  CT head and CTA neck negative.  Recent thyroid studies are normal.  Her EKGs are normal.  She has a known history of PFO but no evidence of stroke.  It appears to me there is no definitive stroke based on her workup thus far.  It is unclear what is causing her symptoms.  I suspect stress could  be contributing.  She describes a lot of work recently.  This clearly can contribute to a multitude of symptoms without a clear explanation.  I have asked her to work on this with her therapist.  Her echocardiogram in 2021 was normal.  She has no evidence of coronary disease on coronary CTA in 2021.  EKG continues to be normal.  I think it is unlikely for her PFO to explain her symptoms.  I also think arrhythmia is unlikely as well.  She has a very normal heart.  We will proceed with a 30-day event monitor as well as echocardiogram with bubble study however is unclear to me that the PFO is of any consequence to her at this time.  I have recommended she follow-up with her neurologist for further management including admission to the epilepsy ward to fully exclude epilepsy diagnosis.  He will also require vitamin D supplementation.  She will see [place] back on a yearly basis.  I will follow-up on her monitor and echocardiogram.      Disposition: Return in about 1 year (around 04/23/2023).  Medication Adjustments/Labs and Tests Ordered: Current medicines are reviewed at length with the patient today.  Concerns regarding medicines are outlined above.  No orders of the defined types were placed in this encounter.  No orders of the defined types were placed in this encounter.   Patient Instructions  Medication Instructions:  The current medical regimen is effective;  continue present plan and medications.  *If you need a refill on your cardiac medications before your next appointment, please call your pharmacy*   Follow-Up: At [facility], you and your health needs are our priority.  As part of our continuing mission to provide you with exceptional heart care, we have created designated Provider Care Teams.  These Care Teams include your primary Cardiologist (physician) and Advanced Practice Providers (APPs -  Physician Assistants and Nurse Practitioners) who all work together to provide you with the care you need, when you need it.  We recommend signing up for the patient portal called "MyChart".  Sign up information is provided on this After Visit Summary.  MyChart is used to connect with patients for Virtual Visits (Telemedicine).  Patients are able to view lab/test results, encounter notes, upcoming appointments, etc.  Non-urgent messages can be sent to your provider as well.   To learn more about what you can do with MyChart, go to [email or web address].    Your next appointment:   12 month(s)  Provider:   Eleonore Chiquito, MD    Time Spent with Patient: I have spent a total of 35 minutes with patient reviewing hospital notes, telemetry, EKGs, labs and examining the patient as well as establishing an assessment and plan that was discussed with the patient.  > 50% of time was spent in direct patient care.  Signed, Addison Naegeli. Audie Box, MD, [place]  [address], [place], [place] [address] [phone]  04/22/2022 8:49 PM

## 2022-04-22 ENCOUNTER — Encounter: Payer: Self-pay | Admitting: Cardiovascular Disease

## 2022-04-22 ENCOUNTER — Ambulatory Visit: Payer: BC Managed Care – PPO | Attending: Cardiovascular Disease | Admitting: Cardiovascular Disease

## 2022-04-22 VITALS — BP 118/76 | HR 71 | Ht 69.0 in | Wt 176.2 lb

## 2022-04-22 DIAGNOSIS — Q2112 Patent foramen ovale: Secondary | ICD-10-CM

## 2022-04-22 DIAGNOSIS — G43809 Other migraine, not intractable, without status migrainosus: Secondary | ICD-10-CM

## 2022-04-22 NOTE — Patient Instructions (Signed)
Medication Instructions:  The current medical regimen is effective;  continue present plan and medications.  *If you need a refill on your cardiac medications before your next appointment, please call your pharmacy*   Follow-Up: At [facility], you and your health needs are our priority.  As part of our continuing mission to provide you with exceptional heart care, we have created designated Provider Care Teams.  These Care Teams include your primary Cardiologist (physician) and Advanced Practice Providers (APPs -  Physician Assistants and Nurse Practitioners) who all work together to provide you with the care you need, when you need it.  We recommend signing up for the patient portal called "MyChart".  Sign up information is provided on this After Visit Summary.  MyChart is used to connect with patients for Virtual Visits (Telemedicine).  Patients are able to view lab/test results, encounter notes, upcoming appointments, etc.  Non-urgent messages can be sent to your provider as well.   To learn more about what you can do with MyChart, go to [email or web address].    Your next appointment:   12 month(s)  Provider:   Eleonore Chiquito, MD

## 2022-04-26 DIAGNOSIS — F4312 Post-traumatic stress disorder, chronic: Secondary | ICD-10-CM | POA: Diagnosis not present

## 2022-04-26 DIAGNOSIS — F411 Generalized anxiety disorder: Secondary | ICD-10-CM | POA: Diagnosis not present

## 2022-05-02 ENCOUNTER — Telehealth: Payer: BC Managed Care – PPO | Admitting: Neurology

## 2022-05-05 ENCOUNTER — Institutional Professional Consult (permissible substitution): Payer: BC Managed Care – PPO | Admitting: Neurology

## 2022-05-10 ENCOUNTER — Encounter: Payer: Self-pay | Admitting: Neurology

## 2022-05-10 ENCOUNTER — Ambulatory Visit: Payer: BC Managed Care – PPO | Admitting: Neurology

## 2022-05-10 ENCOUNTER — Telehealth: Payer: Self-pay

## 2022-05-10 VITALS — BP 110/75 | HR 80 | Ht 69.0 in | Wt 169.1 lb

## 2022-05-10 DIAGNOSIS — F439 Reaction to severe stress, unspecified: Secondary | ICD-10-CM

## 2022-05-10 DIAGNOSIS — R569 Unspecified convulsions: Secondary | ICD-10-CM

## 2022-05-10 MED ORDER — GABAPENTIN 300 MG PO CAPS: 600.0000 mg | ORAL_CAPSULE | Freq: Every day | ORAL | 1 refills | Status: DC

## 2022-05-10 MED ORDER — LAMOTRIGINE ER 25 MG PO TB24: ORAL_TABLET | ORAL | 1 refills | Status: DC

## 2022-05-10 NOTE — Progress Notes (Signed)
[name]    Provider:  Dr Jaynee Eagles Requesting Provider: No ref. provider found Primary Care Provider:  Evangeline Gula, NP  CC:  episodes  05/10/2022: yesterday started feeling worse, cognitive changes, unable to think of words, "zoning out". Side of her face is numb. She is supposed to be admitted to the EMU unit but now she is having a problem at work. She is having difficulty sleeping. She is having stress at work. Her cat starts acting very strangely when this happens. Then the cat starts licking her arm. She wakes up clenched. She can't do it because of work. She doesn't think she has migraines. She thnks she has seizures at night but she can't go to the EMU. She went to her cardiologist and doesn't think it has anything to do with her heart. We can start Lamictal and they can stop that when she goes to the inpatient EMU but in the meantime may help with stress if PNES and if focal seizures (I think PNES).  Sent message: Patient cannot get time off of work for the inpatient EMU. She will eventually have that completed(is in touch with Dr. Hortense Ramal) unless we find something in the meantime, please order weekend 48 hour ambulatory EEG for patient for focal seizure. STAT if possible. Dr. April Manson, can we get a second opinion on possible seizures please? She sent to me for migraines, unlikely, never had a migraine, evaluating for TIA vs seizures, very unlikely "complicated migraines"  Patient complains of symptoms per HPI as well as the following symptoms: altered awareness . Pertinent negatives and positives per HPI. All others negative  04/11/2022: She is having episodes, EEG routine was normal and MRI x 2 was normal. She got sick last week, she was coughing at night and was prescribed for coughing. She had a feeling in left leg started there in bed and it felt like she couldn't stop it (not like RLS) like a convulsion, involuntary movement, it traveled to both arms left >  right. Like a clenching. She was "30%" aware she remembered the episode, it woke her up. Her arm felt like a cramp on the left she couldn't unclench her hand. A really bad cramp and "let go" She took the codeine again and it happened. It has happened since she stopped taking the codeine. Husband is here and had noticed that she is pausing a lot inbetween and "going places" she just spaces out, she has spaced out for 4 years but worsening. As a child she saw a child counselor for seeing visions and she sees then when she spaces but not when falling asleep or getting up from sleep.She saw someone in the MRI with her and she was aware it wasn't true. She has been "off the deep end" as far as emotional stability, feeling where stomach falls like on a roller coaster and then an anxious feeling. As a child she was labeled as learning disabled with dyslexia but never seizures. She took gabapentin for RLS not for seizures. These episodes are NOT in the setting of headaches, do NOT think they are complcated migraines. Episodes can't get words out. "Spacing out".  Daily episodes.  No other focal neurologic deficits, associated symptoms, inciting events or modifiable factors.  Patient complains of symptoms per HPI as well as the following symptoms: multiple neurologic episodes . Pertinent negatives and positives per HPI. All others negative  MRI was normal: reviewed and agree  03/30/2022: addendum: Ldl is elevated. Should be less than  70 if we believe this was a TIA vs migraine. I would start you on cholesterol medicine or have your pcp start you. Your Ferritin is very low 37, we recommend > 70 as low ferritin can worsen restless leg syndrome. Your hgba1c is normal, no diabetes. Let me know if you would like to discuss with your pcp or have [place] treat you for the high LDL and recommend iron supplementation. See phone note  HPI:  Kathryn Brown is a 47 y.o. female here as requested by No ref. provider found for migraine.   She has a past medical history of abnormal uterine bleeding, loop electrical excision procedure, vitamin D deficiency, upper airway cough disease syndrome, dyspnea next exertion, mitral valve prolapse, chest pain, palpitations, heart murmurs, stomach ulcers, small pfo on CT confirmed by echo.  I reviewed notes from the emergency room she was just recently there for headache, visual changes, dizziness, speech difficulty, numbness, tingling,  headache, confusion.   She is here alone. She has had migraines. Top of the head, tight and throbbing/pulsating, sound sensitivity, +nausea, hurts to move, a quiet place helps, advil and alleve helped in the past, she is not very close to biological mother so unknown history, stress is "her life" and a few weeks ago she went through the worst case of stress she has every gone through, constant stress, woke up stressed and had numbness on the right side of the body, weakness right side of the body, lost vision in the right eye for 30 minutes and when it came back she had spots and sloly dissipated never went to the left, did not know she has had a migraine, she has been foggy ever since, getting lost in a place she knew off, she remembers having a headache but not normal migraines. She had a migraine cocktail in the ED temporarily helped but had side effects. Had a paradoxical reaction to compazine, was given toradol and benadryl. Her speech is still affected. She still has right numbness and feels like the right side of her face abnormal. In the past she has had visual changes before her migraines, bright lights, both eyes. >15 headache days a month. All are migrainous. At least 8 moderate to severe and can last 24 hours. No medication overuse. No loss of consciousness. She loses time. She forgets things that happen. Will get an EEG as well.   Reviewed notes, labs and imaging from outside physicians, which showed:    From a thorough evaluation and review of notes  medications tried that can be used in migraine management include: Tylenol, Lexapro, gabapentin, Prozac, ibuprofen, ketorolac, meclizine, metoprolol, naproxen, Zofran, prednisone, Lyrica, Compazine, propranolol, zoloft,   03/26/2022: COMPARISON:  MR head without contrast 07/28/2016   FINDINGS: CT HEAD FINDINGS   Brain: No acute infarct, hemorrhage, or mass lesion is present. No significant white matter lesions are present. Deep brain nuclei are within normal limits.   The ventricles are of normal size. No significant extraaxial fluid collection is present.   The brainstem and cerebellum are within normal limits. Midline structures are within normal limits.   Vascular: No hyperdense vessel or unexpected calcification.   Skull: Calvarium is intact. No focal lytic or blastic lesions are present. No significant extracranial soft tissue lesion is present.   Sinuses/Orbits: The paranasal sinuses and mastoid air cells are clear. The globes and orbits are within normal limits.   Review of the MIP images confirms the above findings   CTA NECK FINDINGS  Aortic arch: The contrast bolus is delayed. Significant venous contrast obscures the proximal great vessels. Visualized aortic arch is within normal limits.   Right carotid system: The right common carotid artery is within normal limits. Bifurcation is normal. Cervical right ICA is normal.   Left carotid system: The left common carotid artery is within normal limits. Bifurcation is unremarkable. Cervical left ICA is normal.   Vertebral arteries: The left vertebral artery is the dominant vessel. Both vertebral arteries originate from the subclavian arteries without significant stenoses. No significant stenosis is present in either vertebral artery in the neck.   Skeleton: Vertebral body heights and alignment are normal. No acute or focal osseous lesions are present.   Other neck: Soft tissues the neck are otherwise  unremarkable. Salivary glands are within normal limits. Thyroid is normal. No significant adenopathy is present. No focal mucosal or submucosal lesions are present.   Upper chest: Mild dependent atelectasis is present. The lungs are otherwise clear.   Review of the MIP images confirms the above findings   CTA HEAD FINDINGS   Anterior circulation: The internal carotid arteries are within normal limits through the ICA termini bilaterally. The A1 and M1 segments are normal. The anterior communicating artery is patent. The MCA bifurcations are within normal limits bilaterally. There is some attenuation of distal ACA and MCA branch vessels which is likely due to timing of the contrast bolus. No significant proximal stenosis or occlusion is present.   Posterior circulation: Left PICA origin is visualized and normal. AICA vessels are present bilaterally. The vertebrobasilar junction basilar artery normal. Both posterior cerebral arteries originate from basilar tip. Similar attenuation of distal PCA branch vessels is present. Proximal PCA vessels are normal.   Venous sinuses: Dural sinuses are not well visualized due to the early timing of the contrast bolus. A separate CT venogram was performed.   Anatomic variants: None   Review of the MIP images confirms the above findings   IMPRESSION: 1. Normal noncontrast CT of the head. 2. Normal CTA of the neck. 3. Normal CTA circle-of-Willis without significant proximal stenosis, aneurysm, or branch vessel occlusion. 4. The study is somewhat degraded in the evaluation of distal vessels due to timing of the contrast bolus.    Review of Systems: Patient complains of symptoms per HPI as well as the following symptoms migraine. Pertinent negatives and positives per HPI. All others negative.   Social History   Socioeconomic History   Marital status: Married    Spouse name: Not on file   Number of children: Not on file   Years of  education: Not on file   Highest education level: Not on file  Occupational History   Occupation: Youth worker  Tobacco Use   Smoking status: Former    Packs/day: 0.50    Years: 10.00    Total pack years: 5.00    Types: Cigarettes   Smokeless tobacco: Never   Tobacco comments:    Little vape   Vaping Use   Vaping Use: Every day   Substances: Flavoring  Substance and Sexual Activity   Alcohol use: Yes    Alcohol/week: 7.0 standard drinks of alcohol    Types: 7 Glasses of wine per week   Drug use: No   Sexual activity: Yes    Birth control/protection: Other-see comments    Comment: spouse-vasectomy  Other Topics Concern   Not on file  Social History Narrative   Not on file   Social Determinants of Health  Financial Resource Strain: Not on file  Food Insecurity: Not on file  Transportation Needs: Not on file  Physical Activity: Not on file  Stress: Not on file  Social Connections: Not on file  Intimate Partner Violence: Not on file    Family History  Problem Relation Age of Onset   Heart disease Mother    Hypertension Mother    Heart disease Father    Hypertension Father    Breast cancer Neg Hx    Migraines Neg Hx     Past Medical History:  Diagnosis Date   Chest pain    Heart murmur    Heart palpitations    History of stomach ulcers    as a child   MVP (mitral valve prolapse) 10/30/2011    Patient Active Problem List   Diagnosis Date Noted   Restless leg syndrome, uncontrolled 04/06/2022   Altered mental status, unspecified 04/06/2022   Memory loss of unknown cause 04/06/2022   Abnormal uterine bleeding (AUB) 09/21/2021   History of loop electrical excision procedure (LEEP) 09/21/2021   Vitamin D deficiency 08/26/2019   MVP (mitral valve prolapse) 10/30/2011    Past Surgical History:  Procedure Laterality Date   TONSILECTOMY, ADENOIDECTOMY, BILATERAL MYRINGOTOMY AND TUBES     TONSILLECTOMY      Current Outpatient Medications   Medication Sig Dispense Refill   aspirin (ASPIRIN CHILDRENS) 81 MG chewable tablet Chew 1 tablet (81 mg total) by mouth daily.     Cholecalciferol (VITAMIN D3) 1.25 MG (50000 UT) capsule Take 1 capsule (50,000 Units total) by mouth once a week. 8 capsule 4   LamoTRIgine (LAMICTAL XR) 25 MG TB24 24 hour tablet First week: '25mg'$  bedtime(1 pill). 2ndweek:'50mg'$  bedtime(2 pills). 3rd week:'75mg'$  bedtime(3 pills). 4th week: '100mg'$  at bedtime(4 pills) 120 tablet 1   gabapentin (NEURONTIN) 300 MG capsule Take 2-3 capsules (600-900 mg total) by mouth at bedtime. 270 capsule 1   No current facility-administered medications for this visit.    Allergies as of 05/10/2022 - Review Complete 05/10/2022  Allergen Reaction Noted   Sulfa antibiotics Anaphylaxis 05/31/2010   Compazine [prochlorperazine]  03/29/2022   Topiramate Other (See Comments) 04/06/2022    Vitals: BP 110/75   Pulse 80   Ht '5\' 9"'$  (1.753 m)   Wt 169 lb 1.6 oz (76.7 kg)   BMI 24.97 kg/m  Last Weight:  Wt Readings from Last 1 Encounters:  05/10/22 169 lb 1.6 oz (76.7 kg)   Last Height:   Ht Readings from Last 1 Encounters:  05/10/22 '5\' 9"'$  (1.753 m)   Physical exam: Exam: Gen: NAD, conversant, well nourised, well groomed                     CV: RRR, no MRG. No Carotid Bruits. No peripheral edema, warm, nontender Eyes: Conjunctivae clear without exudates or hemorrhage  Neuro: Detailed Neurologic Exam  Speech:    Speech is normal; fluent and spontaneous with normal comprehension.  Cognition:    The patient is oriented to person, place, and time;     recent and remote memory intact;     language fluent;     normal attention, concentration,     fund of knowledge Cranial Nerves:    The pupils are equal, round, and reactive to light. The fundi are normal and spontaneous venous pulsations are present. Visual fields are full to finger confrontation. Extraocular movements are intact. Trigeminal sensation is intact and the muscles  of mastication are normal. The face  is symmetric. The palate elevates in the midline. Hearing intact. Voice is normal. Shoulder shrug is normal. The tongue has normal motion without fasciculations.   Coordination:    Normal finger to nose and heel to shin. Normal rapid alternating movements.   Gait:    Heel-toe and tandem gait are normal.   Motor Observation:    No asymmetry, no atrophy, and no involuntary movements noted. Tone:    Normal muscle tone.    Posture:    Posture is normal. normal erect    Strength:    Strength is V/V in the upper and lower limbs.      Sensation: intact to LT     Reflex Exam:  DTR's:    Deep tendon reflexes in the upper and lower extremities are normal bilaterally.   Toes:    The toes are downgoing bilaterally.   Clonus:    Clonus is absent.    Assessment/Plan:  Patient with acute onset right-sided weakness and right eye vision loss was seen and diagnosed with complicatedmigraines which I think is unlikely, I suspect PNES. Since then having multiple episodes, been to the ED multiple times with unknown episodes;  She is having multiple other non-stereotyped issues such as hallucinations, delusions, "spacing out", not getting words out, confusion episodes, repetitive questions, word finding problems, sensory problems on the right side of face, vision changes, episodes of involuntary left leg and bilateral arm movement. Seen in the ED multiple times, offered long-term EEG monitoring by [facility] but they couldn;t stay in the hospital had already been there 10 hours and declined (see Dr. Lyn Records notes from 03/30/2022). Neuro exam is normal. Routine EEGs normal.  05/10/2022: yesterday started feeling worse, cognitive changes, unable to think of words, "zoning out". Side of her face is numb. She is supposed to be admitted to the EMU unit but now she is having a problem at work. She is having difficulty sleeping. She is having stress at work. Her cat starts acting  very strangely when this happens. Then the cat starts licking her arm. She wakes up clenched. She can't do it because of work. She doesn't think she has migraines. She thnks she has seizures at night but she can't go to the EMU. She went to her cardiologist and doesn't think it has anything to do with her heart. We can start Lamictal and they can stop that when she goes to the inpatient EMU but in the meantime may help with stress if PNES and if focal seizures (I think PNES).  Start Lamictal - may help if PNES or seizures Increase Gabapentin at bedtime for sleep and may help if nocturnal seizures and with RLS  Sent message: Patient cannot get time off of work for the inpatient EMU. She will eventually have that completed(is in touch with Dr. Hortense Ramal) unless we find something in the meantime, please order weekend 48 hour ambulatory EEG for patient for focal seizure. STAT if possible. Dr. April Manson, can we get a second opinion on possible seizures please? She sent to me for migraines, unlikely, never had a migraine, evaluating for TIA vs seizures, very unlikely "complicated migraines"  Appreciate my colleague Dr. April Manson, he will try and get her extended ambulatory eeg and give a second opinion within a few weeks  Evaluation to date: - Mri of the brain w/wo contrast - normal x 2 with seizure, stroke, MS protocols - Routine EEG - normal but did not have an episode - Test atrial fibrillation - 30 day heart  monitor pending - saw [facility] yesterday is following up with them  - Sleep apnea testing pending. Will take naps on weekends, snore, tired during the day, recent possible TIA/Stroke needs sleep evaluation also has very bad RLS and kicking all night - appt sleep doctor - also having hallucintaions/deltions could this be REM Sleep intrusion? Narcolepsy? - Echo with bubble study  pending this week has known pfo- seeing cardiologist in the next few weeks - Hgba1c 5.1 and cholesterol panel ldl 116 want it < 70  if TIA - Blood work - b12, b1, vitamin D - Topiramate made her suicidal - answered questions - ferritin was 37, taking iron for RLS - orthostatic VS checked, not orthostatic  Meds ordered this encounter  Medications   DISCONTD: gabapentin (NEURONTIN) 300 MG capsule    Sig: Take 2-3 capsules (600-900 mg total) by mouth at bedtime.    Dispense:  270 capsule    Refill:  1   LamoTRIgine (LAMICTAL XR) 25 MG TB24 24 hour tablet    Sig: First week: '25mg'$  bedtime(1 pill). 2ndweek:'50mg'$  bedtime(2 pills). 3rd week:'75mg'$  bedtime(3 pills). 4th week: '100mg'$  at bedtime(4 pills)    Dispense:  120 tablet    Refill:  1   gabapentin (NEURONTIN) 300 MG capsule    Sig: Take 2-3 capsules (600-900 mg total) by mouth at bedtime.    Dispense:  270 capsule    Refill:  1      Cc: No ref. provider found,  Evangeline Gula, NP  Sarina Ill, MD  [facility] [address] [place],  [address]  Phone [phone] Fax [phone]  I spent over 55 minutes of face-to-face and non-face-to-face time with patient on the  1. Seizure-like activity (HCC)   2. Stress     diagnosis.  This included previsit chart review, lab review, study review, order entry, electronic health record documentation, patient education on the different diagnostic and therapeutic options, counseling and coordination of care, risks and benefits of management, compliance, or risk factor reduction

## 2022-05-10 NOTE — Telephone Encounter (Signed)
LVM for patient to schedule an appointment with Dr April Manson per Dr Ferdinand Lango request. Per Dr April Manson, ok to double book at 11:45. Can offer 3/25 at 11:45

## 2022-05-10 NOTE — Patient Instructions (Addendum)
Start Lamictal - may help with mood stabilizer and seizures Increase Gabapentin at bedtime for sleep and may help if nocturnal seizures and with RLS  Lamotrigine Tablets What is this medication? LAMOTRIGINE (la MOE Hendricks Limes) prevents and controls seizures in people with epilepsy. It may also be used to treat bipolar disorder. It works by calming overactive nerves in your body. This medicine may be used for other purposes; ask your health care provider or pharmacist if you have questions. COMMON BRAND NAME(S): Lamictal, Subvenite What should I tell my care team before I take this medication? They need to know if you have any of these conditions: Heart disease History of irregular heartbeat Immune system problems Kidney disease Liver disease Low levels of folic acid in the blood Lupus Mental health condition Suicidal thoughts, plans, or attempt by you or a family member An unusual or allergic reaction to lamotrigine, other medications, foods, dyes, or preservatives Pregnant or trying to get pregnant Breastfeeding How should I use this medication? Take this medication by mouth with a glass of water. Follow the directions on the prescription label. Do not chew these tablets. If this medication upsets your stomach, take it with food or milk. Take your doses at regular intervals. Do not take your medication more often than directed. A special MedGuide will be given to you by the pharmacist with each new prescription and refill. Be sure to read this information carefully each time. Talk to your care team about the use of this medication in children. While this medication may be prescribed for children as young as 2 years for selected conditions, precautions do apply. Overdosage: If you think you have taken too much of this medicine contact a poison control center or emergency room at once. NOTE: This medicine is only for you. Do not share this medicine with others. What if I miss a dose? If you  miss a dose, take it as soon as you can. If it is almost time for your next dose, take only that dose. Do not take double or extra doses. What may interact with this medication? Atazanavir Certain medications for irregular heartbeat Certain medications for seizures, such as carbamazepine, phenobarbital, phenytoin, primidone, or valproic acid Estrogen or progestin hormones Lopinavir Rifampin Ritonavir This list may not describe all possible interactions. Give your health care provider a list of all the medicines, herbs, non-prescription drugs, or dietary supplements you use. Also tell them if you smoke, drink alcohol, or use illegal drugs. Some items may interact with your medicine. What should I watch for while using this medication? Visit your care team for regular checks on your progress. If you take this medication for seizures, wear a Medic Alert bracelet or necklace. Carry an identification card with information about your condition, medications, and care team. It is important to take this medication exactly as directed. When first starting treatment, your dose will need to be adjusted slowly. It may take weeks or months before your dose is stable. You should contact your care team if your seizures get worse or if you have any new types of seizures. Do not stop taking this medication unless instructed by your care team. Stopping your medication suddenly can increase your seizures or their severity. This medication may cause serious skin reactions. They can happen weeks to months after starting the medication. Contact your care team right away if you notice fevers or flu-like symptoms with a rash. The rash may be red or purple and then turn into blisters or  peeling of the skin. You may also notice a red rash with swelling of the face, lips, or lymph nodes in your neck or under your arms. This medication may affect your coordination, reaction time, or judgment. Do not drive or operate machinery  until you know how this medication affects you. Sit up or stand slowly to reduce the risk of dizzy or fainting spells. Drinking alcohol with this medication can increase the risk of these side effects. If you are taking this medication for bipolar disorder, it is important to report any changes in your mood to your care team. If your condition gets worse, you get mentally depressed, feel very hyperactive or manic, have difficulty sleeping, or have thoughts of hurting yourself or committing suicide, you need to get help from your care team right away. If you are a caregiver for someone taking this medication for bipolar disorder, you should also report these behavioral changes right away. The use of this medication may increase the chance of suicidal thoughts or actions. Pay special attention to how you are responding while on this medication. Your mouth may get dry. Chewing sugarless gum or sucking hard candy and drinking plenty of water may help. Contact your care team if the problem does not go away or is severe. If you become pregnant while using this medication, you may enroll in the [place] Pregnancy Registry by calling [phone]. This registry collects information about the safety of antiepileptic medication use during pregnancy. This medication may cause a decrease in folic acid. You should make sure that you get enough folic acid while you are taking this medication. Discuss the foods you eat and the vitamins you take with your care team. What side effects may I notice from receiving this medication? Side effects that you should report to your care team as soon as possible: Allergic reactions--skin rash, itching, hives, swelling of the face, lips, tongue, or throat Change in vision Fever, neck pain or stiffness, sensitivity to light, headache, nausea, vomiting, confusion Heart rhythm changes--fast or irregular heartbeat, dizziness, feeling faint or lightheaded, chest  pain, trouble breathing Infection--fever, chills, cough, or sore throat Liver injury--right upper belly pain, loss of appetite, nausea, light-colored stool, dark yellow or brown urine, yellowing skin or eyes, unusual weakness or fatigue Low red blood cell count--unusual weakness or fatigue, dizziness, headache, trouble breathing Rash, fever, and swollen lymph nodes Redness, blistering, peeling or loosening of the skin, including inside the mouth Thoughts of suicide or self-harm, worsening mood, or feelings of depression Unusual bruising or bleeding Side effects that usually do not require medical attention (report to your care team if they continue or are bothersome): Diarrhea Dizziness Drowsiness Headache Nausea Stomach pain Tremors or shaking This list may not describe all possible side effects. Call your doctor for medical advice about side effects. You may report side effects to FDA at 1-800-FDA-1088. Where should I keep my medication? Keep out of the reach of children and pets. Store at [employer] C (77 degrees F). Protect from light. Get rid of any unused medication after the expiration date. To get rid of medications that are no longer needed or have expired: Take the medication to a medication take-back program. Check with your pharmacy or law enforcement to find a location. If you cannot return the medication, check the label or package insert to see if the medication should be thrown out in the garbage or flushed down the toilet. If you are not sure, ask your care team.  If it is safe to put it in the trash, empty the medication out of the container. Mix the medication with cat litter, dirt, coffee grounds, or other unwanted substance. Seal the mixture in a bag or container. Put it in the trash. NOTE: This sheet is a summary. It may not cover all possible information. If you have questions about this medicine, talk to your doctor, pharmacist, or health care provider.  2023  Elsevier/Gold Standard (2021-08-23 00:00:00)  Gabapentin Capsules or Tablets What is this medication? GABAPENTIN (GA ba pen tin) treats nerve pain. It may also be used to prevent and control seizures in people with epilepsy. It works by calming overactive nerves in your body. This medicine may be used for other purposes; ask your health care provider or pharmacist if you have questions. COMMON BRAND NAME(S): Active-PAC with Gabapentin, Orpha Bur, Gralise, Neurontin What should I tell my care team before I take this medication? They need to know if you have any of these conditions: Alcohol or substance use disorder Kidney disease Lung or breathing disease Suicidal thoughts, plans, or attempt; a previous suicide attempt by you or a family member An unusual or allergic reaction to gabapentin, other medications, foods, dyes, or preservatives Pregnant or trying to get pregnant Breast-feeding How should I use this medication? Take this medication by mouth with a glass of water. Follow the directions on the prescription label. You can take it with or without food. If it upsets your stomach, take it with food. Take your medication at regular intervals. Do not take it more often than directed. Do not stop taking except on your care team's advice. If you are directed to break the 600 or 800 mg tablets in half as part of your dose, the extra half tablet should be used for the next dose. If you have not used the extra half tablet within 28 days, it should be thrown away. A special MedGuide will be given to you by the pharmacist with each prescription and refill. Be sure to read this information carefully each time. Talk to your care team about the use of this medication in children. While this medication may be prescribed for children as young as 3 years for selected conditions, precautions do apply. Overdosage: If you think you have taken too much of this medicine contact a poison control center or  emergency room at once. NOTE: This medicine is only for you. Do not share this medicine with others. What if I miss a dose? If you miss a dose, take it as soon as you can. If it is almost time for your next dose, take only that dose. Do not take double or extra doses. What may interact with this medication? Alcohol Antihistamines for allergy, cough, and cold Certain medications for anxiety or sleep Certain medications for depression like amitriptyline, fluoxetine, sertraline Certain medications for seizures like phenobarbital, primidone Certain medications for stomach problems General anesthetics like halothane, isoflurane, methoxyflurane, propofol Local anesthetics like lidocaine, pramoxine, tetracaine Medications that relax muscles for surgery Opioid medications for pain Phenothiazines like chlorpromazine, mesoridazine, prochlorperazine, thioridazine This list may not describe all possible interactions. Give your health care provider a list of all the medicines, herbs, non-prescription drugs, or dietary supplements you use. Also tell them if you smoke, drink alcohol, or use illegal drugs. Some items may interact with your medicine. What should I watch for while using this medication? Visit your care team for regular checks on your progress. You may want to keep a record at  home of how you feel your condition is responding to treatment. You may want to share this information with your care team at each visit. You should contact your care team if your seizures get worse or if you have any new types of seizures. Do not stop taking this medication or any of your seizure medications unless instructed by your care team. Stopping your medication suddenly can increase your seizures or their severity. This medication may cause serious skin reactions. They can happen weeks to months after starting the medication. Contact your care team right away if you notice fevers or flu-like symptoms with a rash. The  rash may be red or purple and then turn into blisters or peeling of the skin. Or, you might notice a red rash with swelling of the face, lips or lymph nodes in your neck or under your arms. Wear a medical identification bracelet or chain if you are taking this medication for seizures. Carry a card that lists all your medications. This medication may affect your coordination, reaction time, or judgment. Do not drive or operate machinery until you know how this medication affects you. Sit up or stand slowly to reduce the risk of dizzy or fainting spells. Drinking alcohol with this medication can increase the risk of these side effects. Your mouth may get dry. Chewing sugarless gum or sucking hard candy, and drinking plenty of water may help. Watch for new or worsening thoughts of suicide or depression. This includes sudden changes in mood, behaviors, or thoughts. These changes can happen at any time but are more common in the beginning of treatment or after a change in dose. Call your care team right away if you experience these thoughts or worsening depression. If you become pregnant while using this medication, you may enroll in the [place] Pregnancy Registry by calling [phone]. This registry collects information about the safety of antiepileptic medication use during pregnancy. What side effects may I notice from receiving this medication? Side effects that you should report to your care team as soon as possible: Allergic reactions or angioedema--skin rash, itching, hives, swelling of the face, eyes, lips, tongue, arms, or legs, trouble swallowing or breathing Rash, fever, and swollen lymph nodes Thoughts of suicide or self harm, worsening mood, feelings of depression Trouble breathing Unusual changes in mood or behavior in children after use such as difficulty concentrating, hostility, or restlessness Side effects that usually do not require medical attention (report  to your care team if they continue or are bothersome): Dizziness Drowsiness Nausea Swelling of ankles, feet, or hands Vomiting This list may not describe all possible side effects. Call your doctor for medical advice about side effects. You may report side effects to FDA at 1-800-FDA-1088. Where should I keep my medication? Keep out of reach of children and pets. Store at room temperature between 15 and 30 degrees C (59 and 86 degrees F). Get rid of any unused medication after the expiration date. This medication may cause accidental overdose and death if taken by other adults, children, or pets. To get rid of medications that are no longer needed or have expired: Take the medication to a medication take-back program. Check with your pharmacy or law enforcement to find a location. If you cannot return the medication, check the label or package insert to see if the medication should be thrown out in the garbage or flushed down the toilet. If you are not sure, ask your care team. If it is safe to  put it in the trash, empty the medication out of the container. Mix the medication with cat litter, dirt, coffee grounds, or other unwanted substance. Seal the mixture in a bag or container. Put it in the trash. NOTE: This sheet is a summary. It may not cover all possible information. If you have questions about this medicine, talk to your doctor, pharmacist, or health care provider.  2023 Elsevier/Gold Standard (2020-08-17 00:00:00)

## 2022-05-11 ENCOUNTER — Telehealth: Payer: Self-pay

## 2022-05-11 NOTE — Telephone Encounter (Signed)
EEG orders Faxed to [facility]

## 2022-05-12 ENCOUNTER — Ambulatory Visit (HOSPITAL_COMMUNITY): Payer: BC Managed Care – PPO | Attending: Internal Medicine

## 2022-05-12 DIAGNOSIS — G459 Transient cerebral ischemic attack, unspecified: Secondary | ICD-10-CM | POA: Diagnosis not present

## 2022-05-12 LAB — ECHOCARDIOGRAM COMPLETE BUBBLE STUDY
Area-P 1/2: 2.93 cm2
S' Lateral: 3.1 cm

## 2022-05-13 ENCOUNTER — Ambulatory Visit: Payer: BC Managed Care – PPO | Admitting: Family Medicine

## 2022-05-13 ENCOUNTER — Telehealth: Payer: Self-pay

## 2022-05-13 ENCOUNTER — Encounter: Payer: Self-pay | Admitting: Family Medicine

## 2022-05-13 VITALS — BP 116/70 | HR 84 | Temp 98.4°F | Ht 69.0 in | Wt 176.2 lb

## 2022-05-13 DIAGNOSIS — Z13220 Encounter for screening for lipoid disorders: Secondary | ICD-10-CM

## 2022-05-13 DIAGNOSIS — G2581 Restless legs syndrome: Secondary | ICD-10-CM | POA: Diagnosis not present

## 2022-05-13 DIAGNOSIS — Z09 Encounter for follow-up examination after completed treatment for conditions other than malignant neoplasm: Secondary | ICD-10-CM | POA: Diagnosis not present

## 2022-05-13 DIAGNOSIS — E559 Vitamin D deficiency, unspecified: Secondary | ICD-10-CM

## 2022-05-13 DIAGNOSIS — Z23 Encounter for immunization: Secondary | ICD-10-CM | POA: Diagnosis not present

## 2022-05-13 NOTE — Progress Notes (Unsigned)
Established Patient Office Visit   Subjective:  Patient ID: Kathryn Brown, female    DOB: 30-Aug-1975  Age: 47 y.o. MRN: [id number]  Chief Complaint  Patient presents with   Follow-up    Pt is here today to follow-up. Pt reports no concerns today. Pt states she has been dx seizure.    HPI Restless leg syndrome: On previous visit in February, Gabapentin was increased to 300mg  from 100mg . Since visit, patient has seen Dr. Jaynee Eagles, neurologist. She has increased Gabapentin further to 300mg  2-3 tablets at bedtime. Still not controlled, but starting Lamictal also for seizures. Also, taking an iron supplement for RLS since Ferritin levels were on the lower normal side in January.   LDL were elevated in January. Concern about starting cholesterol   medication to get LDL below 70 with thoughts of TIA verses migraines. At this point, it looks like her symptoms are more related to seizures. She is currently not taking any cholesterol medication. Her ASCVD risk score is 0.5%.  Vitamin D Deficiency: Patients vitamin D was 5.4 on 02/12. She is taking vitamin D supplement. Needs redraw on 05/15.  ROS See HPI above    Objective:    BP 116/70   Pulse 84   Temp 98.4 F (36.9 C)   Ht 5\' 9"  (1.753 m)   Wt 176 lb 4 oz (79.9 kg)   SpO2 95%   BMI 26.03 kg/m    Physical Exam Vitals reviewed.  Constitutional:      General: She is not in acute distress.    Appearance: Normal appearance. She is not ill-appearing, toxic-appearing or diaphoretic.  Eyes:     General:        Right eye: No discharge.        Left eye: No discharge.     Conjunctiva/sclera: Conjunctivae normal.  Cardiovascular:     Rate and Rhythm: Normal rate and regular rhythm.     Heart sounds: Normal heart sounds. No murmur heard.    No friction rub. No gallop.  Pulmonary:     Effort: Pulmonary effort is normal. No respiratory distress.     Breath sounds: Normal breath sounds.  Musculoskeletal:        General: Normal range of  motion.  Skin:    General: Skin is warm and dry.  Neurological:     General: No focal deficit present.     Mental Status: She is alert and oriented to person, place, and time. Mental status is at baseline.  Psychiatric:        Mood and Affect: Mood normal.        Behavior: Behavior normal.        Thought Content: Thought content normal.        Judgment: Judgment normal.    The 10-year ASCVD risk score (Arnett DK, et al., 2019) is: 0.5%    Assessment & Plan:  Follow-up examination  Restless leg syndrome, uncontrolled -     Iron, TIBC and Ferritin Panel; Future  Vitamin D deficiency -     VITAMIN D 25 Hydroxy (Vit-D Deficiency, Fractures); Future  Lipid screening -     Lipid panel; Future  Diphtheria, tetanus, acellular pertussis, and inactivated poliovirus vaccine (DTaP-IPV) administered -     Tdap vaccine greater than or equal to 7yo IM  1.Patient scored high on PHQ-9 depression screening. Patient reports she is currently in counseling once a week with [facility]. Denies SI and HI. She reports her depression is more  related to her weight gain. Recommend to start a regular exercise regiment, such as walking at at steady pace for 30 minutes, 4-5 times a week. Recommend to decrease greasy, fatty foods. Limit fast food. This will also help with cholesterol.  2.Tetanus vaccine administered.  3.Ordered future labs, vitamin D for deficiency, lipid screening for elevated LDL, and iron panel to assess ferritin level that could be related to restless leg syndrome, to be completed around May 15th.  Return in about 6 months (around 11/13/2022) for physical.   Valarie Merino, NP

## 2022-05-13 NOTE — Patient Instructions (Signed)
-  Ordered labs for May (Vitamin D, iron panel, and lipids). Be fasting for these labs. Only water and black coffee 6 hours before.  -Recommend to decrease greasy, fatty foods. Limit fast food. Recommend to exercise on regular regiment, such as walking 30 minutes a day, 4-5 times a day.  -Tetanus vaccine was prescribed.

## 2022-05-16 ENCOUNTER — Inpatient Hospital Stay (HOSPITAL_COMMUNITY): Admission: RE | Admit: 2022-05-16 | Payer: BC Managed Care – PPO | Source: Ambulatory Visit | Admitting: Neurology

## 2022-05-16 NOTE — Telephone Encounter (Signed)
error 

## 2022-05-23 ENCOUNTER — Encounter: Payer: Self-pay | Admitting: Neurology

## 2022-05-23 ENCOUNTER — Ambulatory Visit: Payer: BC Managed Care – PPO | Admitting: Neurology

## 2022-05-23 VITALS — BP 109/59 | HR 79 | Ht 69.0 in | Wt 177.5 lb

## 2022-05-23 DIAGNOSIS — R569 Unspecified convulsions: Secondary | ICD-10-CM | POA: Diagnosis not present

## 2022-05-23 MED ORDER — LAMOTRIGINE ER 100 MG PO TB24: 100.0000 mg | ORAL_TABLET | Freq: Every day | ORAL | 11 refills | Status: DC

## 2022-05-23 NOTE — Progress Notes (Unsigned)
[name]  PATIENT: Kathryn Brown DOB: Nov 06, 1975  REQUESTING CLINICIAN: Evangeline Gula, NP HISTORY FROM: Patient  REASON FOR VISIT: Events concerning for seizures    HISTORICAL  CHIEF COMPLAINT:  Chief Complaint  Patient presents with   Follow-up    Rm 12, alone Seizure: no recent seizures, lamictal has made it better, cognitive issues "spaced out"    HISTORY OF PRESENT ILLNESS:  This is a 47 year old woman with history of restless leg syndrome who is presenting with paroxysmal events described as spacing out, unexplained fall and convulsion during sleep.  She reports on Mar 09 2021, fell out of office chair at work, found by daughter, went to ED. She cannot recall how she fell of the chair. Her work up was negative.  She reports a long history of getting trouble in school for not paying attention, she was even put in Special Ed course but does not have any history of intellectual disability. She did well in school because in order to focus she must write everything down or else she will lose her attention. She has never been work up for ADD.  In her adult life, she reports that she tends to pause during conversation for up to 5 second and she is able to pick back up where she left off. These episodes tend to be worse when she is fatigue, stressed out or sick.   The week of Thanksgiving, she had a unexplained fall at home, and bruises her right ankle. She just found herself on the floor, does not remember how she got there.   In January 2024, she experienced right side of face tingling sensation, then followed by tiredness, no loss of consciousness. She had visual disturbances, disorded sound, was very confused. She was seen in the ED, was given a migraine cocktail and then experienced an episode of convulsion.   Reports that her husband has told her in the past she had convulsion during her sleep, she has no memory of it.   Her work up so far including  MRI brain and EEG were normal, she is pending an ambulatory EEG.   She has seen Dr. Jaynee Eagles who started her on Lamotrigine and she reports feeling better on this medication, currently she is on Lamotrigine 75 mg and plan is to go up to 100 mg.     OTHER MEDICAL CONDITIONS: Restless leg syndrome   REVIEW OF SYSTEMS: Full 14 system review of systems performed and negative with exception of: As noted in the HPI   ALLERGIES: Allergies  Allergen Reactions   Sulfa Antibiotics Anaphylaxis   Compazine [Prochlorperazine]     Paradoxical reaction.   Topiramate Other (See Comments)    Suicidal thoughts    HOME MEDICATIONS: Outpatient Medications Prior to Visit  Medication Sig Dispense Refill   Ascorbic Acid (VITAMIN C) POWD Take 1,000 mg by mouth daily.     aspirin (ASPIRIN CHILDRENS) 81 MG chewable tablet Chew 1 tablet (81 mg total) by mouth daily.     Cholecalciferol (VITAMIN D3) 1.25 MG (50000 UT) capsule Take 1 capsule (50,000 Units total) by mouth once a week. 8 capsule 4   Ferrous Sulfate (IRON SUPPLEMENT PO) Take 1 tablet by mouth daily.     gabapentin (NEURONTIN) 300 MG capsule Take 2-3 capsules (600-900 mg total) by mouth at bedtime. 270 capsule 1   LamoTRIgine (LAMICTAL XR) 25 MG TB24 24 hour tablet First week: 25mg  bedtime(1 pill). 2ndweek:50mg  bedtime(2 pills). 3rd week:75mg  bedtime(3  pills). 4th week: 100mg  at bedtime(4 pills) 120 tablet 1   No facility-administered medications prior to visit.    PAST MEDICAL HISTORY: Past Medical History:  Diagnosis Date   Chest pain    Heart murmur    Heart palpitations    History of stomach ulcers    as a child   MVP (mitral valve prolapse) 10/30/2011   Seizure ([place])     PAST SURGICAL HISTORY: Past Surgical History:  Procedure Laterality Date   TONSILECTOMY, ADENOIDECTOMY, BILATERAL MYRINGOTOMY AND TUBES     TONSILLECTOMY      FAMILY HISTORY: Family History  Problem Relation Age of Onset   Heart disease Mother     Hypertension Mother    Heart disease Father    Hypertension Father    Breast cancer Neg Hx    Migraines Neg Hx     SOCIAL HISTORY: Social History   Socioeconomic History   Marital status: Married    Spouse name: Not on file   Number of children: Not on file   Years of education: Not on file   Highest education level: Not on file  Occupational History   Occupation: Youth worker  Tobacco Use   Smoking status: Former    Packs/day: 0.50    Years: 10.00    Additional pack years: 0.00    Total pack years: 5.00    Types: Cigarettes   Smokeless tobacco: Never   Tobacco comments:    Little vape   Vaping Use   Vaping Use: Every day   Substances: Flavoring  Substance and Sexual Activity   Alcohol use: Yes    Alcohol/week: 7.0 standard drinks of alcohol    Types: 7 Glasses of wine per week   Drug use: No   Sexual activity: Yes    Birth control/protection: Other-see comments    Comment: spouse-vasectomy  Other Topics Concern   Not on file  Social History Narrative   Not on file   Social Determinants of Health   Financial Resource Strain: Not on file  Food Insecurity: Not on file  Transportation Needs: Not on file  Physical Activity: Not on file  Stress: Not on file  Social Connections: Not on file  Intimate Partner Violence: Not on file    PHYSICAL EXAM  GENERAL EXAM/CONSTITUTIONAL: Vitals:  Vitals:   05/23/22 1129  BP: (!) 109/59  Pulse: 79  Weight: 177 lb 8 oz (80.5 kg)  Height: 5\' 9"  (1.753 m)   Body mass index is 26.21 kg/m. Wt Readings from Last 3 Encounters:  05/23/22 177 lb 8 oz (80.5 kg)  05/13/22 176 lb 4 oz (79.9 kg)  05/10/22 169 lb 1.6 oz (76.7 kg)   Patient is in no distress; well developed, nourished and groomed; neck is supple  EYES: Visual fields full to confrontation, Extraocular movements intacts,  No results found.  MUSCULOSKELETAL: Gait, strength, tone, movements noted in Neurologic exam below  NEUROLOGIC: MENTAL  STATUS:      No data to display         awake, alert, oriented to person, place and time recent and remote memory intact normal attention and concentration language fluent, comprehension intact, naming intact fund of knowledge appropriate  CRANIAL NERVE:  2nd, 3rd, 4th, 6th - Visual fields full to confrontation, extraocular muscles intact, no nystagmus 5th - facial sensation symmetric 7th - facial strength symmetric 8th - hearing intact 9th - palate elevates symmetrically, uvula midline 11th - shoulder shrug symmetric 12th - tongue protrusion midline  MOTOR:  normal bulk and tone, full strength in the BUE, BLE  SENSORY:  normal and symmetric to light touch  COORDINATION:  finger-nose-finger, fine finger movements normal  REFLEXES:  deep tendon reflexes present and symmetric  GAIT/STATION:  normal     DIAGNOSTIC DATA (LABS, IMAGING, TESTING) - I reviewed patient records, labs, notes, testing and imaging myself where available.  Lab Results  Component Value Date   WBC 6.2 03/30/2022   HGB 14.2 03/30/2022   HCT 42.2 03/30/2022   MCV 90.9 03/30/2022   PLT 259 03/30/2022      Component Value Date/Time   NA 137 03/30/2022 1119   NA 140 08/19/2019 1446   K 3.9 03/30/2022 1119   CL 103 03/30/2022 1119   CO2 21 (L) 03/30/2022 1119   GLUCOSE 91 03/30/2022 1119   BUN 9 03/30/2022 1119   BUN 12 08/19/2019 1446   CREATININE 0.89 03/30/2022 1119   CREATININE 0.81 10/29/2011 1046   CALCIUM 9.3 03/30/2022 1119   PROT 7.0 03/30/2022 1119   PROT 7.3 05/25/2017 1851   ALBUMIN 4.1 03/30/2022 1119   ALBUMIN 4.7 05/25/2017 1851   AST 20 03/30/2022 1119   ALT 21 03/30/2022 1119   ALKPHOS 51 03/30/2022 1119   BILITOT 0.4 03/30/2022 1119   BILITOT 0.3 05/25/2017 1851   GFRNONAA >60 03/30/2022 1119   GFRAA 100 08/19/2019 1446   Lab Results  Component Value Date   CHOL 227 (H) 03/29/2022   HDL 91 03/29/2022   LDLCALC 116 (H) 03/29/2022   TRIG 116 03/29/2022    Lab Results  Component Value Date   HGBA1C 5.1 03/29/2022   Lab Results  Component Value Date   VITAMINB12 409 04/11/2022   Lab Results  Component Value Date   TSH 0.603 03/26/2022    MRI Brain with and without contrast 04/06/2022 This is a normal MRI of the brain with and without contrast.    EEG 03/31/2022 This is a normal EEG recorded while drowsy and awake. No evidence of interictal epileptiform discharges. Normal EEGs, however, do not rule out epilepsy    I personally reviewed brain Images and previous EEG reports.   ASSESSMENT AND PLAN  47 y.o. year old female  with history of restless leg syndrome on gabapentin who is presenting with multiple complaints of unexplained fall, convulsion, losing track of time.  So far her workup including EEG and an MRI has been normal. She has been put on lamotrigine and it seems the medication has been helping.  Plan is for patient to continue titration up to 100 mg daily and will obtain a 3-day ambulatory EEG.  Follow-up in 3 months.   1. Seizure-like activity ([place])     Patient Instructions  Continue same medications, currently she is on Lamotrigine XR 75 mg daily, plan will be to increase to 100 mg daily. New Rx for 100 tablets sent to pharmacy Follow with ambulatory EEG  Please video tape all events if possible  Return in 3 months or sooner if worse      Orders Placed This Encounter  Procedures   AMBULATORY EEG    Meds ordered this encounter  Medications   lamoTRIgine (LAMICTAL XR) 100 MG 24 hour tablet    Sig: Take 1 tablet (100 mg total) by mouth daily.    Dispense:  30 tablet    Refill:  11    Return in about 3 months (around 08/23/2022).  I have spent a total of 55 minutes dedicated to  this patient today, preparing to see patient, performing a medically appropriate examination and evaluation, ordering tests and/or medications and procedures, and counseling and educating the patient/family/caregiver; independently  interpreting result and communicating results to the family/patient/caregiver; and documenting clinical information in the electronic medical record.   Alric Ran, MD 05/24/2022, 7:49 AM  [facility] [address], [place], [place] [address] [phone]

## 2022-05-24 NOTE — Patient Instructions (Addendum)
Continue same medications, currently she is on Lamotrigine XR 75 mg daily, plan will be to increase to 100 mg daily. New Rx for 100 tablets sent to pharmacy Follow with ambulatory EEG  Please video tape all events if possible  Return in 3 months or sooner if worse

## 2022-05-26 ENCOUNTER — Ambulatory Visit (INDEPENDENT_AMBULATORY_CARE_PROVIDER_SITE_OTHER): Payer: BC Managed Care – PPO | Admitting: Neurology

## 2022-05-26 ENCOUNTER — Encounter: Payer: Self-pay | Admitting: Neurology

## 2022-05-26 VITALS — BP 106/68 | HR 83 | Ht 67.0 in | Wt 175.0 lb

## 2022-05-26 DIAGNOSIS — R233 Spontaneous ecchymoses: Secondary | ICD-10-CM | POA: Diagnosis not present

## 2022-05-26 DIAGNOSIS — F518 Other sleep disorders not due to a substance or known physiological condition: Secondary | ICD-10-CM | POA: Insufficient documentation

## 2022-05-26 DIAGNOSIS — G4769 Other sleep related movement disorders: Secondary | ICD-10-CM | POA: Insufficient documentation

## 2022-05-26 DIAGNOSIS — M7918 Myalgia, other site: Secondary | ICD-10-CM | POA: Diagnosis not present

## 2022-05-26 DIAGNOSIS — G253 Myoclonus: Secondary | ICD-10-CM | POA: Diagnosis not present

## 2022-05-26 DIAGNOSIS — G4753 Recurrent isolated sleep paralysis: Secondary | ICD-10-CM | POA: Diagnosis not present

## 2022-05-26 HISTORY — DX: Spontaneous ecchymoses: R23.3

## 2022-05-26 NOTE — Patient Instructions (Addendum)
Component Ref Range & Units 1 mo ago  Total Iron Binding Capacity 250 - 450 ug/dL 415  UIBC 131 - 425 ug/dL 348  Iron 27 - 159 ug/dL 67  Iron Saturation 15 - 55 % 16  Ferritin 15 - 150 ng/mL 37     Myoclonus Myoclonus is the sudden and quick movement of the muscles. It may include muscle jerks, twitches, or spasms that happen without a person's control (involuntary). There are different types of myoclonus. One type, called physiologic myoclonus, occurs normally in most people and rarely needs treatment. This type includes: Hiccups. Sleep starts, or twitching during sleep. Other types are caused by an underlying condition and may require treatment. What are the causes? The cause of this condition varies depending on the type of myoclonus. Physiologic myoclonus, such as hiccups, results from normal actions of the body. Other causes include: Genetic condition or disease. This causes a type of myoclonus called essential myoclonus. Epilepsy. This causes epileptic myoclonus. Other underlying conditions cause secondary or symptomatic myoclonus. These include: Side effects of certain medicines. Metabolic conditions. Head, brain, or spinal injuries. Stroke. Nervous system conditions such as dementia, Parkinson's disease, and Alzheimer's disease. Infections. Poisoning. Brain tumors. What are the signs or symptoms? The main symptom of this condition is sudden spasms, jerking, or uncontrollable movements. Symptoms may: Occur in only one area of the body or over the entire body. Come and go. Vary in intensity. Make it hard for you to eat, speak, or walk. How is this diagnosed?  This condition is diagnosed based on your medical history, a review of your symptoms, and a physical exam. You may also have tests, including: Electroencephalogram (EEG). This test checks the electrical activity of the brain. Electromyogram (EMG). This test measures electrical activity in the muscles. Imaging  tests such as an MRI or CT scan. Lumbar puncture (spinal tap) to check spinal fluid for infection or inflammation. Blood tests. Urine tests. How is this treated? Treatment for this condition depends on the underlying condition and the severity of your symptoms. Treatment may include: Medicines, such as tranquilizer and anti-seizure medicines. Treating the underlying cause, such as having a tumor removed or taking antibiotic medicine for an infection. Injections of a substance called botulinum toxin. Physiologic myoclonus does not require treatment. Follow these instructions at home: Take over-the-counter and prescription medicines only as told by your health care provider. If you are taking tranquilizer or anti-seizure medicines, do not drive or use machinery until your body adjusts to the medicine. These medicines may cause drowsiness. Keep a journal of your symptoms and the things that seem to trigger them or make them worse. Also, try to identity the things that make them better. Share this information with your health care provider. Keep all follow-up visits. This is important. Where to find more information [employer] of Neurological Disorders and Stroke: [employer] Contact a health care provider if: You have severe pain and medicines do not help. You have problems taking your medicines. Your twitches, jerks, or spasms get worse. Get help right away if: You have a seizure. You have a reaction to your medicines, such as a rash, trouble breathing, or swelling. You have trouble staying alert. These symptoms may be an emergency. Get help right away. Call 911. Do not wait to see if the symptoms will go away. Do not drive yourself to the hospital. Summary Myoclonus is the sudden and quick movement of the muscles. It may include muscle jerks, twitches, or spasms that happen without  a person's control (involuntary). There are different types of myoclonus. Physiologic  myoclonus occurs normally in most people and rarely needs treatment. Other types of myoclonus are caused by an underlying condition. Treatment for this condition depends on the underlying condition and the severity of your symptoms. If you are taking tranquilizer or anti-seizure medicines, do not drive or use machinery until your body adjusts to the medicine. These medicines may cause drowsiness. This information is not intended to replace advice given to you by your health care provider. Make sure you discuss any questions you have with your health care provider. Document Revised: 12/04/2020 Document Reviewed: 12/04/2020 Elsevier Patient Education  [place]. Fatigue If you have fatigue, you feel tired all the time and have a lack of energy or a lack of motivation. Fatigue may make it difficult to start or complete tasks because of exhaustion. Occasional or mild fatigue is often a normal response to activity or life. However, long-term (chronic) or extreme fatigue may be a symptom of a medical condition such as: Depression. Not having enough red blood cells or hemoglobin in the blood (anemia). A problem with a small gland located in the lower front part of the neck (thyroid disorder). Rheumatologic conditions. These are problems related to the body's defense system (immune system). Infections, especially certain viral infections. Fatigue can also lead to negative health outcomes over time. Follow these instructions at home: Medicines Take over-the-counter and prescription medicines only as told by your health care provider. Take a multivitamin if told by your health care provider. Do not use herbal or dietary supplements unless they are approved by your health care provider. Eating and drinking  Avoid heavy meals in the evening. Eat a well-balanced diet, which includes lean proteins, whole grains, plenty of fruits and vegetables, and low-fat dairy products. Avoid eating or drinking  too many products with caffeine in them. Avoid alcohol. Drink enough fluid to keep your urine pale yellow. Activity  Exercise regularly, as told by your health care provider. Use or practice techniques to help you relax, such as yoga, tai chi, meditation, or massage therapy. Lifestyle Change situations that cause you stress. Try to keep your work and personal schedules in balance. Do not use recreational or illegal drugs. General instructions Monitor your fatigue for any changes. Go to bed and get up at the same time every day. Avoid fatigue by pacing yourself during the day and getting enough sleep at night. Maintain a healthy weight. Contact a health care provider if: Your fatigue does not get better. You have a fever. You suddenly lose or gain weight. You have headaches. You have trouble falling asleep or sleeping through the night. You feel angry, guilty, anxious, or sad. You have swelling in your legs or another part of your body. Get help right away if: You feel confused, feel like you might faint, or faint. Your vision is blurry or you have a severe headache. You have severe pain in your abdomen, your back, or the area between your waist and hips (pelvis). You have chest pain, shortness of breath, or an irregular or fast heartbeat. You are unable to urinate, or you urinate less than normal. You have abnormal bleeding from the rectum, nose, lungs, nipples, or, if you are female, the vagina. You vomit blood. You have thoughts about hurting yourself or others. These symptoms may be an emergency. Get help right away. Call 911. Do not wait to see if the symptoms will go away. Do not drive yourself  to the hospital. Get help right away if you feel like you may hurt yourself or others, or have thoughts about taking your own life. Go to your nearest emergency room or: Call 911. Call the [facility] at [phone] or [phone]. This is open 24 hours a  day. Text the Crisis Text Line at 928 288 2441. Summary If you have fatigue, you feel tired all the time and have a lack of energy or a lack of motivation. Fatigue may make it difficult to start or complete tasks because of exhaustion. Long-term (chronic) or extreme fatigue may be a symptom of a medical condition. Exercise regularly, as told by your health care provider. Change situations that cause you stress. Try to keep your work and personal schedules in balance. This information is not intended to replace advice given to you by your health care provider. Make sure you discuss any questions you have with your health care provider. Document Revised: 12/07/2020 Document Reviewed: 12/07/2020 Elsevier Patient Education  [place].

## 2022-05-26 NOTE — Progress Notes (Addendum)
SLEEP MEDICINE CLINIC    Provider:  Larey Seat, MD  Primary Care Physician:  Evangeline Gula, NP [id number] [place] [address] [place] [address]     Referring Provider: Melvenia Beam, [place],  [place] [address]          Chief Complaint according to patient   Patient presents with:     New Patient (Initial Visit)           HISTORY OF PRESENT ILLNESS:  [name] is a 47 y.o. female patient who is seen upon referral on 05/26/2022 from Dr Jaynee Eagles, MD for a Sleep Medicine Consultation.  Chief concern according to patient :  Daytime fatigue and snoring for about 3 years. Also possible RLS for about 30 years that comes and goes. She is starting to have jerking as well in upper extremities.- restless limbs , not just legs. Onset with pregnancy at age 65- was anemic at the time. Walking, massaging does not give relief, however- its seems to further trigger activity.    I have the pleasure of seeing Kathryn Brown on 05/26/22 who is a right-handed Caucasian, married female with a possible sleep disorder. The patient never had a sleep study.   Sleep relevant medical history: on Lamictal for spells and had been in the past on gabapentin for limbs-  anemia in her pregnancy 29 years ago. Lucid dreams, Sleep paralysis. falling in her dreams, myoclonic jerk    Nocturia 1 time but woken up by RLS and not the urge to urinate.  Childhood Night terrors, other Parasomnia , no Tonsillectomy, cervical spine surgery/** deviated septum repair? UPPP?    Family medical /sleep history: father on CPAP with OSA, he had agent orange exposure- PTSD- no sleep walkers.    Social history:  Patient is working as Arboriculturist , and lives in a household with spouse,  adult daughter is living independently.   The patient currently works with on call duties-  night. . Pets are present. 2 cats.  Tobacco use- was vaping, quit in 2022  ETOH use 7/ week,  Caffeine intake in form  of Coffee( 1 mug/ AM and one in PM) Soda( /) Tea ( /) or energy drinks Exercise in form of walking- .        Sleep habits are as follows: The patient's dinner time is between 6-8 PM. The patient goes to bed at 9.30 PM and continues to sleep for one hour, wakes due to episodes: "leg jerking, clenching  fists,  bilaterally- and the bed sheets will be all over the bed- and she is only woken by her cat or her husband , she sleeps through the episodes for most of it. When she wakes she feels sore." No episodes in daytime, only in sleep- " bedroom is cool, quiet and dark.     The preferred sleep position is laterally, with the support of 3-4 pillows. Sleeps elevated .   Dreams are reportedly very frequent/vivid/ and she has lucid and vivid dreams. The patient wakes up with an alarm. 5.30  AM is the usual rise time. Husband has sleep inertia and sets his alarm at 5 AM.   She reports not feeling refreshed or restored in AM, groggy feeling- with symptoms such as dry mouth,  and residual fatigue.  Naps are taken infrequently, on weekends- lasting from 20 to 45 minutes and are even less refreshing than nocturnal sleep.   Dr Jaynee Eagles  s notes : 05/10/2022: yesterday started feeling worse, cognitive changes, unable to think of words, "zoning out". Side of her face is numb. She is supposed to be admitted to the EMU unit but now she is having a problem at work. She is having difficulty sleeping. She is having stress at work. Her cat starts acting very strangely when this happens. Then the cat starts licking her arm. She wakes up clenched. She can't do it because of work. She doesn't think she has migraines. She thnks she has seizures at night but she can't go to the EMU. She went to her cardiologist and doesn't think it has anything to do with her heart. We can start Lamictal and they can stop that when she goes to the inpatient EMU but in the meantime may help with stress if PNES and if focal seizures (I think PNES).    Sent message: Patient cannot get time off of work for the inpatient EMU. She will eventually have that completed(is in touch with Dr. Hortense Ramal) unless we find something in the meantime, please order weekend 48 hour ambulatory EEG for patient for focal seizure. STAT if possible. Dr. April Manson, can we get a second opinion on possible seizures please? She sent to me for migraines, unlikely, never had a migraine, evaluating for TIA vs seizures, very unlikely "complicated migraines"   Patient complains of symptoms per HPI as well as the following symptoms: altered awareness . Pertinent negatives and positives per HPI. All others negative   04/11/2022: She is having episodes, EEG routine ( Dr Buckner [place])  was normal and MRI brain x 2 was normal. She got sick last week, she was coughing at night and was prescribed for coughing. She had a feeling in left leg started while in bed and it felt like she couldn't stop it (not like RLS), more  like a convulsion, an involuntary movement, it traveled to both arms left > right side. Like a clenching. She was "30%" aware she remembered the episode, it woke her up. Her arm felt like a cramp , on the left she couldn't unclench her hand. A really bad cramp and "let go" She took the codeine again and it happened. It has happened since she stopped taking the codeine. Husband is here and had noticed that she is pausing a lot inbetween and "going places" she just spaces out, she has spaced out for 4 years but worsening.  As a child she saw a child counselor for seeing visions and she sees then when she spaces but not when falling asleep or getting up from sleep.She saw someone in the MRI with her and she was aware it wasn't true. She has been "off the deep end" as far as emotional stability, feeling where stomach falls like on a roller coaster and then an anxious feeling. As a child she was labeled as learning disabled with dyslexia but never seizures. She took gabapentin for RLS not for  seizures. These episodes are NOT in the setting of headaches, do NOT think they are complcated migraines. Episodes can't get words out. "Spacing out".  Daily episodes.  No other focal neurologic deficits, associated symptoms, inciting events or modifiable factors.   Patient complains of symptoms per HPI as well as the following symptoms: multiple neurologic episodes . Pertinent negatives and positives per HPI. All others negative   MRI was normal: reviewed and agree   03/30/2022: addendum: Ldl is elevated. Should be less than 70 if we believe this was a TIA  vs migraine. I would start you on cholesterol medicine or have your pcp start you. Your Ferritin is very low 37, we recommend > 70 as low ferritin can worsen restless leg syndrome. Your hgba1c is normal, no diabetes. Let me know if you would like to discuss with your pcp or have [place] treat you for the high LDL and recommend iron supplementation. See phone note   HPI:  CORLENE WISECARVER is a 46 y.o. female here as requested by No ref. provider found for migraine.  She has a past medical history of abnormal uterine bleeding, loop electrical excision procedure, vitamin D deficiency, upper airway cough disease syndrome, dyspnea next exertion, mitral valve prolapse, chest pain, palpitations, heart murmurs, stomach ulcers, small pfo on CT confirmed by echo.  I reviewed notes from the emergency room she was just recently there for headache, visual changes, dizziness, speech difficulty, numbness, tingling,  headache, confusion.    She is here alone. She has had migraines. Top of the head, tight and throbbing/pulsating, sound sensitivity, +nausea, hurts to move, a quiet place helps, advil and alleve helped in the past, she is not very close to biological mother so unknown history, stress is "her life" and a few weeks ago she went through the worst case of stress she has every gone through, constant stress, woke up stressed and had numbness on the right side of the  body, weakness right side of the body, lost vision in the right eye for 30 minutes and when it came back she had spots and sloly dissipated never went to the left, did not know she has had a migraine, she has been foggy ever since, getting lost in a place she knew off, she remembers having a headache but not normal migraines. She had a migraine cocktail in the ED temporarily helped but had side effects. Had a paradoxical reaction to compazine, was given toradol and benadryl. Her speech is still affected. She still has right numbness and feels like the right side of her face abnormal. In the past she has had visual changes before her migraines, bright lights, both eyes. >15 headache days a month. All are migrainous. At least 8 moderate to severe and can last 24 hours. No medication overuse. No loss of consciousness. She loses time. She forgets things that happen. Will get an EEG as well.      Review of Systems: Out of a complete 14 system review, the patient complains of only the following symptoms, and all other reviewed systems are negative.:   Spells, cramps, involuntary movements.    Fatigue, sleepiness , snoring  RLS,    GAD anxiety disorder  Anemia.  How likely are you to doze in the following situations: 0 = not likely, 1 = slight chance, 2 = moderate chance, 3 = high chance   Sitting and Reading? Watching Television? Sitting inactive in a public place (theater or meeting)? As a passenger in a car for an hour without a break? Lying down in the afternoon when circumstances permit? Sitting and talking to someone? Sitting quietly after lunch without alcohol? In a car, while stopped for a few minutes in traffic?   Total = 9/ 24 points   FSS endorsed at 51/ 63 points.   Social History   Socioeconomic History   Marital status: Married    Spouse name: Not on file   Number of children: One daughter   Years of education: HS and 3 year of college-    Highest education  level: Not on  file  Occupational History   Occupation: Youth worker  Tobacco Use   Smoking status: Former    Packs/day: 0.50    Years: 10.00    Additional pack years: 0.00    Total pack years: 5.00    Types: Cigarettes   Smokeless tobacco: Never   Tobacco comments:    Little vape   Vaping Use   Vaping Use: Every day   Substances: Flavoring  Substance and Sexual Activity   Alcohol use: Yes    Alcohol/week: 7.0 standard drinks of alcohol    Types: 7 Glasses of wine per week   Drug use: No   Sexual activity: Yes    Birth control/protection: Other-see comments    Comment: spouse-vasectomy  Other Topics Concern   Not on file  Social History Narrative   Not on file   Social Determinants of Health   Financial Resource Strain: Not on file  Food Insecurity: Not on file  Transportation Needs: Not on file  Physical Activity: Not on file  Stress: Not on file  Social Connections: Not on file    Family History  Problem Relation Age of Onset   Heart disease Mother    Hypertension Mother    Heart disease Father    Hypertension Father    Breast cancer Neg Hx    Migraines Neg Hx     Past Medical History:  Diagnosis Date   Chest pain    Heart murmur    Heart palpitations    History of stomach ulcers    as a child   MVP (mitral valve prolapse) 10/30/2011   Seizure ([place])     Past Surgical History:  Procedure Laterality Date   TONSILECTOMY, ADENOIDECTOMY, BILATERAL MYRINGOTOMY AND TUBES     TONSILLECTOMY       Current Outpatient Medications on File Prior to Visit  Medication Sig Dispense Refill   Ascorbic Acid (VITAMIN C) POWD Take 1,000 mg by mouth daily.     aspirin (ASPIRIN CHILDRENS) 81 MG chewable tablet Chew 1 tablet (81 mg total) by mouth daily.     Cholecalciferol (VITAMIN D3) 1.25 MG (50000 UT) capsule Take 1 capsule (50,000 Units total) by mouth once a week. 8 capsule 4   Ferrous Sulfate (IRON SUPPLEMENT PO) Take 1 tablet by mouth daily.     gabapentin  (NEURONTIN) 300 MG capsule Take 2-3 capsules (600-900 mg total) by mouth at bedtime. 270 capsule 1   lamoTRIgine (LAMICTAL XR) 100 MG 24 hour tablet Take 1 tablet (100 mg total) by mouth daily. 30 tablet 11   No current facility-administered medications on file prior to visit.    Allergies  Allergen Reactions   Sulfa Antibiotics Anaphylaxis   Compazine [Prochlorperazine]     Paradoxical reaction.   Topiramate Other (See Comments)    Suicidal thoughts     DIAGNOSTIC DATA (LABS, IMAGING, TESTING) - I reviewed patient records, labs, notes, testing and imaging myself where available.  Lab Results  Component Value Date   WBC 6.2 03/30/2022   HGB 14.2 03/30/2022   HCT 42.2 03/30/2022   MCV 90.9 03/30/2022   PLT 259 03/30/2022      Component Value Date/Time   NA 137 03/30/2022 1119   NA 140 08/19/2019 1446   K 3.9 03/30/2022 1119   CL 103 03/30/2022 1119   CO2 21 (L) 03/30/2022 1119   GLUCOSE 91 03/30/2022 1119   BUN 9 03/30/2022 1119   BUN 12 08/19/2019 1446  CREATININE 0.89 03/30/2022 1119   CREATININE 0.81 10/29/2011 1046   CALCIUM 9.3 03/30/2022 1119   PROT 7.0 03/30/2022 1119   PROT 7.3 05/25/2017 1851   ALBUMIN 4.1 03/30/2022 1119   ALBUMIN 4.7 05/25/2017 1851   AST 20 03/30/2022 1119   ALT 21 03/30/2022 1119   ALKPHOS 51 03/30/2022 1119   BILITOT 0.4 03/30/2022 1119   BILITOT 0.3 05/25/2017 1851   GFRNONAA >60 03/30/2022 1119   GFRAA 100 08/19/2019 1446   Lab Results  Component Value Date   CHOL 227 (H) 03/29/2022   HDL 91 03/29/2022   LDLCALC 116 (H) 03/29/2022   TRIG 116 03/29/2022   CHOLHDL 2.5 03/29/2022   Lab Results  Component Value Date   HGBA1C 5.1 03/29/2022   Lab Results  Component Value Date   VITAMINB12 409 04/11/2022   Lab Results  Component Value Date   TSH 0.603 03/26/2022     Component Ref Range & Units 1 mo ago  Total Iron Binding Capacity 250 - 450 ug/dL 415  UIBC 131 - 425 ug/dL 348  Iron 27 - 159 ug/dL 67  Iron  Saturation 15 - 55 % 16  Ferritin 15 - 150 ng/mL 37   PHYSICAL EXAM:  There were no vitals filed for this visit. There is no height or weight on file to calculate BMI.   Wt Readings from Last 3 Encounters:  05/23/22 177 lb 8 oz (80.5 kg)  05/13/22 176 lb 4 oz (79.9 kg)  05/10/22 169 lb 1.6 oz (76.7 kg)     Ht Readings from Last 3 Encounters:  05/23/22 5\' 9"  (1.753 m)  05/13/22 5\' 9"  (1.753 m)  05/10/22 5\' 9"  (1.753 m)      General: The patient is awake, alert and appears not in acute distress. The patient is well groomed. Head: Normocephalic, atraumatic. Neck is supple. Mallampati 2,  neck circumference:15 inches . Nasal airflow  patent.  Retrognathia is mild.  Dental status: biological, no bruxism.  Cardiovascular:  Regular rate and cardiac rhythm by pulse,  without distended neck veins. Respiratory: Lungs are clear to auscultation.  Skin:  Without evidence of ankle edema, or rash. Trunk: The patient's posture is erect.   NEUROLOGIC EXAM: The patient is awake and alert, oriented to place and time.   Memory subjective described as intact.  Attention span & concentration ability appears normal.  Speech is fluent,  without  dysarthria, dysphonia or aphasia.  Mood and affect are appropriate.   Cranial nerves: no loss of smell or taste reported  Pupils are equal and briskly reactive to light.  Extraocular movements in vertical and horizontal planes were intact and without nystagmus. No Diplopia. Visual fields by finger perimetry are intact. Hearing was intact to soft voice and finger rubbing.   Facial sensation intact to fine touch.  Facial motor strength is symmetric and tongue and uvula move midline.  Neck ROM : rotation, tilt and flexion extension were normal for age and shoulder shrug was symmetrical.    Motor exam:  Symmetric bulk, tone and ROM.   Normal tone without cog- wheeling, and with symmetric grip strength .   Sensory:  Fine touch, pinprick and vibration  were tested  and  normal.  Proprioception tested in the upper extremities was normal.   Coordination: Rapid alternating movements in the fingers/hands were of normal speed.  The Finger-to-nose maneuver was intact on the left without evidence of ataxia, dysmetria or tremor. She was slower on the right side, missed  the target- no tremor. No pronator drift    Gait and station: Patient could rise unassisted from a seated position, walked without assistive device.  Stance is of normal width/ base .  Toe and heel walk were deferred.  Deep tendon reflexes: in the upper and lower extremities are symmetric and intact.  Babinski response was deferred .    ASSESSMENT AND PLAN: Mrs HILIANA POITRA is a 47  year old female presenting  here with unexplained spells of alteration of awareness, nocturnal cramps or spasms, involuntary muscle movements : EEG and MRIs were negative.  An EMU evaluation has been cancelled by provider.  She had been concerned abut MS one time, afe ter loss if vision, amaurosis in one right OD for 30 minutes.  This is one of many spells with no explanation today.     1)  lets investigate for nocturnal seizures versus other confusional arousals : full EEG PSG ordered.   2) leg cramping and myoclonus-  lets try hot shower before bedtime, tonic, magnesium.   3) no excessive daytime sleepiness , but some of the narcolepsy associated symptoms were endorsed- this can be part of a nocturnal seizure. 4) low Ferritin , under 50 is high risk for RLS. Also very high TIBC and very low normal iron saturation ay only 16%   Parasomnia/ RLS ? Involuntary movements, Myoclonus.  All in the first hour of sleep- PAVOR NOCTURNUS.   Wil order a special montage EEG and PSG and implement the magnesium po.    I plan to follow up either personally or through our NP within 6 months.   I would like to thank Evangeline Gula, NP and Melvenia Beam, [place],  [place] [address]  for allowing me to meet with and to take care of this pleasant patient.   CC: I will share my notes with dr Jaynee Eagles.  After spending a total time of  45  minutes face to face and additional time for physical and neurologic examination, review of laboratory studies,  personal review of imaging studies, reports and results of other testing and review of referral information / records as far as provided in visit,   Electronically signed by: Larey Seat, MD 05/26/2022 12:30 PM  [name] [place] certified by The [employer] of Sleep Medicine and Diplomate of the [employer] of Sleep Medicine. Board certified In Neurology through the [place], Fellow of the [employer] of Neurology. Medical Director of [employer].

## 2022-06-08 ENCOUNTER — Telehealth: Payer: Self-pay | Admitting: Neurology

## 2022-06-08 NOTE — Telephone Encounter (Signed)
BCBS pending uploaded notes 

## 2022-06-13 ENCOUNTER — Encounter (INDEPENDENT_AMBULATORY_CARE_PROVIDER_SITE_OTHER): Payer: BC Managed Care – PPO | Admitting: Neurology

## 2022-06-13 DIAGNOSIS — R569 Unspecified convulsions: Secondary | ICD-10-CM | POA: Diagnosis not present

## 2022-06-13 NOTE — Telephone Encounter (Signed)
[employer]BS denied the NPSG please see below. Do you want to order a HST or do a p2p?

## 2022-06-14 ENCOUNTER — Telehealth: Payer: Self-pay | Admitting: Neurology

## 2022-06-14 NOTE — Telephone Encounter (Signed)
Thank you for your message seeking medical advice.* My assessment and recommendation are as follows: Kathryn Brown, I will increase your lamictal but you really need a prolonged eeg to ensure these are seizures. Have you scheduled or been contacted for the at- home 48-72 hour test yet? I placed an order for an at home eeg prolonged test. you need to be admitted into the hospital if that does not show anything to the monitoring unit as we discussed this is non negotiable we need to figure out whether these are seizures or not. And you should seek medical help if this happens again, call 911.   Please remember: You cannot drive for 6 months after your last seizure-like event. Per [facility] statutes, patients with seizures are not allowed to drive until they have been seizure-free for six months.    Use caution when using heavy equipment or power tools. Avoid working on ladders or at heights. Take showers instead of baths. Ensure the water temperature is not too high on the home water heater. Do not go swimming alone. Do not lock yourself in a room alone (i.e. bathroom). When caring for infants or small children, sit down when holding, feeding, or changing them to minimize risk of injury to the child in the event you have a seizure. Maintain good sleep hygiene. Avoid alcohol.    If patient has another seizure, call 911 and bring them back to the ED if: A.  The seizure lasts longer than 5 minutes.      B.  The patient doesn't wake shortly after the seizure or has new problems such as difficulty seeing, speaking or moving following the seizure C.  The patient was injured during the seizure D.  The patient has a temperature over 102 F (39C) E.  The patient vomited during the seizure and now is having trouble breathing  Per [facility] statutes, patients with seizures are not allowed to drive until they have been seizure-free for six months.  Other recommendations include using caution when using  heavy equipment or power tools. Avoid working on ladders or at heights. Take showers instead of baths.  Do not swim alone.  Ensure the water temperature is not too high on the home water heater. Do not go swimming alone. Do not lock yourself in a room alone (i.e. bathroom). When caring for infants or small children, sit down when holding, feeding, or changing them to minimize risk of injury to the child in the event you have a seizure. Maintain good sleep hygiene. Avoid alcohol.  Also recommend adequate sleep, hydration, good diet and minimize stress.  During the Seizure  - First, ensure adequate ventilation and place patients on the floor on their left side  Loosen clothing around the neck and ensure the airway is patent. If the patient is clenching the teeth, do not force the mouth open with any object as this can cause severe damage - Remove all items from the surrounding that can be hazardous. The patient may be oblivious to what's happening and may not even know what he or she is doing. If the patient is confused and wandering, either gently guide him/her away and block access to outside areas - Reassure the individual and be comforting - Call 911. In most cases, the seizure ends before EMS arrives. However, there are cases when seizures may last over 3 to 5 minutes. Or the individual may have developed breathing difficulties or severe injuries. If a pregnant patient or a  person with diabetes develops a seizure, it is prudent to call an ambulance. - Finally, if the patient does not regain full consciousness, then call EMS. Most patients will remain confused for about 45 to 90 minutes after a seizure, so you must use judgment in calling for help. - Avoid restraints but make sure the patient is in a bed with padded side rails - Place the individual in a lateral position with the neck slightly flexed; this will help the saliva drain from the mouth and prevent the tongue from falling backward - Remove  all nearby furniture and other hazards from the area - Provide verbal assurance as the individual is regaining consciousness - Provide the patient with privacy if possible - Call for help and start treatment as ordered by the caregiver   fter the Seizure (Postictal Stage)  After a seizure, most patients experience confusion, fatigue, muscle pain and/or a headache. Thus, one should permit the individual to sleep. For the next few days, reassurance is essential. Being calm and helping reorient the person is also of importance.  Most seizures are painless and end spontaneously. Seizures are not harmful to others but can lead to complications such as stress on the lungs, brain and the heart. Individuals with prior lung problems may develop labored breathing and respiratory distress.   Sincerely,  Anson Fret, MD    *This exchange required the expertise of a doctor, nurse practitioner, physician assistant, optometrist or certified nurse midwife and qualifies as a Medical Advice Message, please visit [email or web address] for more details.  will bill your insurance on your behalf; copays and deductibles may apply. Questions? Reply to this message.

## 2022-06-14 NOTE — Telephone Encounter (Signed)
Pod 4: I requested a prolomged ambulatory eeg for patient. Has that been submitted? Will you call the company and see if she has responded please?

## 2022-06-15 NOTE — Telephone Encounter (Signed)
From the fax that we received from [employer] they have tried to contact pt to schedule and she has not been responsive.  I will send the # [phone] to pt.  She is scheduled next Friday for this.  I called pt and she told me this.

## 2022-06-16 ENCOUNTER — Other Ambulatory Visit: Payer: Self-pay | Admitting: Neurology

## 2022-06-16 DIAGNOSIS — R404 Transient alteration of awareness: Secondary | ICD-10-CM

## 2022-06-16 DIAGNOSIS — G2581 Restless legs syndrome: Secondary | ICD-10-CM

## 2022-06-16 DIAGNOSIS — G4753 Recurrent isolated sleep paralysis: Secondary | ICD-10-CM

## 2022-06-16 DIAGNOSIS — F518 Other sleep disorders not due to a substance or known physiological condition: Secondary | ICD-10-CM

## 2022-06-16 DIAGNOSIS — G4719 Other hypersomnia: Secondary | ICD-10-CM

## 2022-06-16 NOTE — Telephone Encounter (Signed)
Order placed

## 2022-06-16 NOTE — Telephone Encounter (Signed)
Noted, HST [employer] Berkley Harvey: [phone] (exp. 06/16/22 to 08/14/22)- NPSG denied

## 2022-06-24 DIAGNOSIS — G4089 Other seizures: Secondary | ICD-10-CM | POA: Diagnosis not present

## 2022-06-24 DIAGNOSIS — R259 Unspecified abnormal involuntary movements: Secondary | ICD-10-CM | POA: Diagnosis not present

## 2022-06-25 DIAGNOSIS — R259 Unspecified abnormal involuntary movements: Secondary | ICD-10-CM | POA: Diagnosis not present

## 2022-06-25 DIAGNOSIS — G4089 Other seizures: Secondary | ICD-10-CM | POA: Diagnosis not present

## 2022-06-26 DIAGNOSIS — R259 Unspecified abnormal involuntary movements: Secondary | ICD-10-CM | POA: Diagnosis not present

## 2022-06-26 DIAGNOSIS — G4089 Other seizures: Secondary | ICD-10-CM | POA: Diagnosis not present

## 2022-07-08 ENCOUNTER — Encounter: Payer: Self-pay | Admitting: Neurology

## 2022-07-08 DIAGNOSIS — R569 Unspecified convulsions: Secondary | ICD-10-CM

## 2022-07-08 NOTE — Procedures (Signed)
Clinical History : This is a 47 y/o F who presents with events of spacing out. She reports she had an episode in bed of a convulsion like movement which was involuntary and it traveled up both arms, like a clenching. Patient and husband remarked that she has been 'spacing out' for the past four years, but seems to be getting worse.  INTERMITTENT MONITORING with VIDEO TECHNICAL SUMMARY: This AVEEG was performed using equipment provided by [employer] utilizing Bluetooth ( Trackit ) amplifiers with continuous EEGT attended video collection using encrypted remote transmission via [employer] secured cellular tower network with data rates for each AVEEG performed. This is a Therapist, music AVEEG, obtained, according to the 10-20 international electrode placement system, reformatted digitally into referential and bipolar montages. Data was acquired with a minimum of 21 bipolar connections and sampled at a minimum rate of 250 cycles per second per channel, maximum rate of 450 cycles per second per channel and two channels for EKG. The entire VEEG study was recorded through cable and or radio telemetry for subsequent analysis. Specified epochs of the AVEEG data were identified at the direction of the subject by the depression of a push button by the patient. Each patients event file included data acquired two minutes prior to the push button activation and continuing until two minutes afterwards. AVEEG files were reviewed on [facility] server, Licensed Software provided by [employer] with a digital high frequency filter set at 70 Hz and a low frequency filter set at 1 Hz with a paper speed of 86mm/s resulting in 10 seconds per digital page. This entire AVEEG was reviewed by the EEG Technologist. Random time samples, random sleep samples, clips, patient initiated push button files with included patient daily diary logs, EEG Technologist pruned data was reviewed and verified for accuracy and  validity by the governing reading neurologist in full details. This AEEGV was fully compliant with all requirements for CPT 97500 for setup, patient education, take down and administered by an EEG technologist. Long-Term EEG with Video was monitored intermittently by a qualified EEG technologist for the entirety of the recording; quality check-ins were performed at a minimum of every two hours, checking and documenting real-time data and video to assure the integrity and quality of the recording (e.g., camera position, electrode integrity and impedance), and identify the need for maintenance. For intermittent monitoring, an EEG Technologist monitored no more than 12 patients concurrently. Diagnostic video was captured at least 80% of the time during the recording.  PATIENT EVENTS: There were no patient events noted or captured during this recording.  TECHNOLOGIST EVENTS: No clear epileptiform activity was detected by the reviewing neurodiagnostic technologist during the recording for further evaluation.  TIME SAMPLES: 10-minutes of every 2 hours recorded are reviewed as random time samples.  SLEEP SAMPLES: 5-minutes of every 24 hours recorded are reviewed as random sleep samples.  AWAKE: At maximal level of alertness, the posterior dominant background activity was continuous, reactive, low voltage rhythm of 8.5 Hz. This was symmetric, well-modulated, and attenuated with eye opening. Diffuse, symmetric, frontocentral beta range activity was present.  SLEEP: N1 Sleep (Stage 1) was observed and characterized by the disappearance of alpha rhythm and the appearance of vertex activity. N2 Sleep (Stage 2) was observed and characterized by vertex waves, K-complexes, and sleep spindles. N3 (Stage 3) sleep was observed and characterized by high amplitude Delta activity of 20%. REM sleep was observed. EKG: There were no arrhythmias or abnormalities noted during this recording.  Impression: This is  a normal 48 hours ambulatory video EEG. There were no seizures, epileptiform discharge or events captured during this recording. Normal EEGs, however do not rule out epilepsy.    Windell Norfolk, MD [name]

## 2022-07-12 ENCOUNTER — Telehealth: Payer: Self-pay

## 2022-07-12 NOTE — Telephone Encounter (Signed)
07/13/2022 is suppose to be a lab visit, unless pt would like a appt.for anything else.

## 2022-07-12 NOTE — Telephone Encounter (Signed)
Changed.

## 2022-07-13 ENCOUNTER — Ambulatory Visit: Payer: BC Managed Care – PPO | Admitting: Family Medicine

## 2022-07-13 ENCOUNTER — Other Ambulatory Visit: Payer: BC Managed Care – PPO

## 2022-07-15 ENCOUNTER — Other Ambulatory Visit: Payer: BC Managed Care – PPO

## 2022-07-15 ENCOUNTER — Other Ambulatory Visit: Payer: Self-pay

## 2022-07-15 DIAGNOSIS — Z13220 Encounter for screening for lipoid disorders: Secondary | ICD-10-CM | POA: Diagnosis not present

## 2022-07-15 DIAGNOSIS — E559 Vitamin D deficiency, unspecified: Secondary | ICD-10-CM

## 2022-07-15 DIAGNOSIS — E785 Hyperlipidemia, unspecified: Secondary | ICD-10-CM

## 2022-07-15 DIAGNOSIS — G2581 Restless legs syndrome: Secondary | ICD-10-CM

## 2022-07-15 DIAGNOSIS — E1169 Type 2 diabetes mellitus with other specified complication: Secondary | ICD-10-CM

## 2022-07-15 DIAGNOSIS — R790 Abnormal level of blood mineral: Secondary | ICD-10-CM

## 2022-07-15 NOTE — Addendum Note (Signed)
Addended by: Eldred Manges on: 07/15/2022 04:30 PM   Modules accepted: Orders

## 2022-07-16 LAB — IRON,TIBC AND FERRITIN PANEL
%SAT: 19 % (calc) (ref 16–45)
Ferritin: 20 ng/mL (ref 16–232)
Iron: 79 ug/dL (ref 40–190)
TIBC: 425 mcg/dL (calc) (ref 250–450)

## 2022-07-16 LAB — LIPID PANEL
Cholesterol: 236 mg/dL — ABNORMAL HIGH (ref <200)
HDL: 77 mg/dL (ref >=50)
LDL Cholesterol (Calc): 140 mg/dL (calc) — ABNORMAL HIGH
Non-HDL Cholesterol (Calc): 159 mg/dL (calc) — ABNORMAL HIGH (ref <130)
Total CHOL/HDL Ratio: 3.1 (calc) (ref <5.0)
Triglycerides: 91 mg/dL (ref <150)

## 2022-07-16 LAB — VITAMIN D 25 HYDROXY (VIT D DEFICIENCY, FRACTURES): Vit D, 25-Hydroxy: 47 ng/mL (ref 30–100)

## 2022-07-18 ENCOUNTER — Telehealth: Payer: Self-pay

## 2022-07-18 NOTE — Telephone Encounter (Signed)
-----   Message from Alveria Apley, NP sent at 07/17/2022  5:40 PM EDT ----- Bad (LDL) and total cholesterol are elevated, diet encouraged - increase intake of fresh fruits and vegetables, increase intake of lean proteins. Bake, broil, or grill foods. Avoid fried, greasy, and fatty foods. Avoid fast foods. Increase intake of fiber-rich whole grains. Exercise encouraged - at least 150 minutes per week and advance as tolerated. Levels are not high enough to need medication.  Vitamin D has improved to normal limits. Recommend over  the counter vitamin D3 1,000IU daily. All other labs are stable. Recommend to continue with iron supplement.

## 2022-07-20 ENCOUNTER — Telehealth: Payer: Self-pay | Admitting: Neurology

## 2022-07-20 NOTE — Telephone Encounter (Signed)
LVM and sent mychart msg informing pt of need to reschedule 08/29/22 appt - MD out 

## 2022-08-03 DIAGNOSIS — M62830 Muscle spasm of back: Secondary | ICD-10-CM | POA: Diagnosis not present

## 2022-08-03 DIAGNOSIS — M9903 Segmental and somatic dysfunction of lumbar region: Secondary | ICD-10-CM | POA: Diagnosis not present

## 2022-08-03 DIAGNOSIS — M9901 Segmental and somatic dysfunction of cervical region: Secondary | ICD-10-CM | POA: Diagnosis not present

## 2022-08-03 DIAGNOSIS — M9905 Segmental and somatic dysfunction of pelvic region: Secondary | ICD-10-CM | POA: Diagnosis not present

## 2022-08-03 DIAGNOSIS — M9902 Segmental and somatic dysfunction of thoracic region: Secondary | ICD-10-CM | POA: Diagnosis not present

## 2022-08-03 NOTE — Telephone Encounter (Signed)
Please see the MyChart message reply(ies) for my assessment and plan.    This patient gave consent for this Medical Advice Message and is aware that it may result in a bill to [employer], as well as the possibility of receiving a bill for a co-payment or deductible. They are an established patient, but are not seeking medical advice exclusively about a problem treated during an in person or video visit in the last seven days. I did not recommend an in person or video visit within seven days of my reply.    I spent a total of 21 minutes cumulative time within 7 days through [employer].   Anson Fret, MD

## 2022-08-03 NOTE — Telephone Encounter (Signed)
Please see the MyChart message reply(ies) for my assessment and plan.    This patient gave consent for this Medical Advice Message and is aware that it may result in a bill to their insurance company, as well as the possibility of receiving a bill for a co-payment or deductible. They are an established patient, but are not seeking medical advice exclusively about a problem treated during an in person or video visit in the last seven days. I did not recommend an in person or video visit within seven days of my reply.    I spent a total of 21 minutes cumulative time within 7 days through MyChart messaging.   Dayne Chait B, MD  

## 2022-08-05 ENCOUNTER — Telehealth: Payer: Self-pay

## 2022-08-05 NOTE — Telephone Encounter (Signed)
Error

## 2022-08-15 DIAGNOSIS — Z1330 Encounter for screening examination for mental health and behavioral disorders, unspecified: Secondary | ICD-10-CM | POA: Diagnosis not present

## 2022-08-15 DIAGNOSIS — R2990 Unspecified symptoms and signs involving the nervous system: Secondary | ICD-10-CM | POA: Diagnosis not present

## 2022-08-19 ENCOUNTER — Encounter: Payer: Self-pay | Admitting: Family Medicine

## 2022-08-19 ENCOUNTER — Ambulatory Visit: Payer: BC Managed Care – PPO | Admitting: Family Medicine

## 2022-08-19 VITALS — BP 122/64 | HR 76 | Temp 98.5°F | Ht 67.0 in | Wt 177.0 lb

## 2022-08-19 DIAGNOSIS — F32A Depression, unspecified: Secondary | ICD-10-CM

## 2022-08-19 DIAGNOSIS — N926 Irregular menstruation, unspecified: Secondary | ICD-10-CM

## 2022-08-19 DIAGNOSIS — R413 Other amnesia: Secondary | ICD-10-CM | POA: Diagnosis not present

## 2022-08-19 NOTE — Patient Instructions (Signed)
-  It was a pleasure to see you today. -Recommend to make an appointment with [facility] for [facility] [place].  -

## 2022-08-19 NOTE — Progress Notes (Signed)
   Established Patient Office Visit   Subjective:  Patient ID: Kathryn Brown, female    DOB: 05/18/1975  Age: 47 y.o. MRN: [id number]  Chief Complaint  Patient presents with   Menopause    Pt is here today to talk about menopause    HPI Patient is complaining of abnormal cycle, severe menstrual cramps, changing mood swings, and memory fog. Menstrual cycle lasting 5-7 days, having a menstrual cycle every month, but verifying of how often it occurs.   Patient is being seen by [facility] and had a second opinion with [facility] and [facility]. Patient is concerned that her symptoms she has been having could be related to premenopausal.   ROS See HPI above     Objective:   BP 122/64   Pulse 76   Temp 98.5 F (36.9 C)   Ht 5\' 7"  (1.702 m)   Wt 177 lb (80.3 kg)   SpO2 95%   BMI 27.72 kg/m    Physical Exam Vitals reviewed.  Constitutional:      General: She is not in acute distress.    Appearance: Normal appearance. She is not ill-appearing, toxic-appearing or diaphoretic.  Eyes:     General:        Right eye: No discharge.        Left eye: No discharge.     Conjunctiva/sclera: Conjunctivae normal.  Cardiovascular:     Rate and Rhythm: Normal rate and regular rhythm.     Heart sounds: Normal heart sounds. No murmur heard.    No friction rub. No gallop.  Pulmonary:     Effort: Pulmonary effort is normal. No respiratory distress.     Breath sounds: Normal breath sounds.  Musculoskeletal:        General: Normal range of motion.  Skin:    General: Skin is warm and dry.  Neurological:     General: No focal deficit present.     Mental Status: She is alert and oriented to person, place, and time. Mental status is at baseline.      Assessment & Plan:  Abnormal menstrual cycle  Depression, unspecified depression type  Memory loss of unknown cause  1.Recommend patient make an appointment with [facility] [place]  with Dr. Sharen Counter. Advised if she wanted another provider or practice, to call or message the office for a new provider.  2.Offered patient a new referral with [facility] with neurology for her memory concerns, but she is going to follow through with [facility]. 3.Patient scored high on the PHQ-9 depression screening. Declines medication and/or therapy referral.   No follow-ups on file.   Zandra Abts, NP

## 2022-08-29 ENCOUNTER — Ambulatory Visit: Payer: BC Managed Care – PPO | Admitting: Neurology

## 2022-08-30 ENCOUNTER — Other Ambulatory Visit (HOSPITAL_BASED_OUTPATIENT_CLINIC_OR_DEPARTMENT_OTHER): Payer: BC Managed Care – PPO

## 2022-08-30 ENCOUNTER — Telehealth (INDEPENDENT_AMBULATORY_CARE_PROVIDER_SITE_OTHER): Payer: BC Managed Care – PPO | Admitting: Obstetrics & Gynecology

## 2022-08-30 ENCOUNTER — Encounter (HOSPITAL_BASED_OUTPATIENT_CLINIC_OR_DEPARTMENT_OTHER): Payer: Self-pay | Admitting: Obstetrics & Gynecology

## 2022-08-30 ENCOUNTER — Other Ambulatory Visit: Payer: Self-pay | Admitting: Obstetrics & Gynecology

## 2022-08-30 DIAGNOSIS — N951 Menopausal and female climacteric states: Secondary | ICD-10-CM | POA: Diagnosis not present

## 2022-08-30 NOTE — Progress Notes (Unsigned)
Virtual Visit via Video Note  I connected with Kathryn Brown on 08/30/22 at 11:15 AM EDT by a video enabled telemedicine application and verified that I am speaking with the correct person using two identifiers.  Location: Patient: work Provider: office   I discussed the limitations of evaluation and management by telemedicine and the availability of in person appointments. The patient expressed understanding and agreed to proceed.  History of Present Illness: 47 yo G2P2 MWF with concerns about perimenopausal symptoms.    H/o possible TIA back in the fall.  Was seen in the ER.  CT and MRI were normal.  EEG normal.   She has restless leg syndrome that is pretty significant.  Gabapentin and propranolol has not helped.  She is questioning whether any of her symptoms are hormonal related/perimenopausal related.  She isn't really having a lot of hot flashes but did have an episode last week that started as feeling overheated, with sweating.  She has some accompanying dizziness and felt like she was going to pass out.  This lasted about 5 minutes, at least the most severe part of this episode.  It did resolve but this was new for her.    She is frustrate by her weight gain.  She feels her diet is good and she is active as well.    There is a metallic taste in her mouth.  She also feels she doesn't have great memory.  She has fatigue all of the time.  Feels like she is exhausted by 2pm.    Having increase ankle and feet swelling as well.  Menstrual cycles are fairly regualr and last about 7 days.  She is having increased menstrual pain/cramping but has improved.  Flow is heavier than it used to be.   Observations/Objective:   Assessment and Plan:   Follow Up Instructions:    I discussed the assessment and treatment plan with the patient. The patient was provided an opportunity to ask questions and all were answered. The patient agreed with the plan and demonstrated an understanding of  the instructions.   The patient was advised to call back or seek an in-person evaluation if the symptoms worsen or if the condition fails to improve as anticipated.  I provided *** minutes of non-face-to-face time during this encounter.   Jerene Bears, MD

## 2022-08-31 LAB — FOLLICLE STIMULATING HORMONE: FSH: 3 m[IU]/mL

## 2022-08-31 LAB — ESTRADIOL: Estradiol: 117 pg/mL

## 2022-09-23 ENCOUNTER — Telehealth: Payer: Self-pay | Admitting: Family Medicine

## 2022-09-23 NOTE — Telephone Encounter (Signed)
Pt has requested to transfer care from Zandra Abts, NP to Dr. Casimiro Needle. Is this transfer okay with both providers?

## 2022-09-23 NOTE — Telephone Encounter (Signed)
Ok with me if ok with Kathryn Brown

## 2022-09-29 ENCOUNTER — Ambulatory Visit: Payer: BC Managed Care – PPO | Admitting: Neurology

## 2022-09-29 ENCOUNTER — Encounter: Payer: Self-pay | Admitting: Neurology

## 2022-09-29 VITALS — BP 117/73 | HR 81 | Ht 68.0 in | Wt 182.5 lb

## 2022-09-29 DIAGNOSIS — R569 Unspecified convulsions: Secondary | ICD-10-CM

## 2022-09-29 MED ORDER — LAMOTRIGINE ER 50 MG PO TB24: 50.0000 mg | ORAL_TABLET | Freq: Every day | ORAL | 0 refills | Status: DC

## 2022-09-29 NOTE — Progress Notes (Signed)
[name]  PATIENT: Kathryn Brown DOB: 01/26/1976  REQUESTING CLINICIAN: Alveria Apley, NP HISTORY FROM: Patient  REASON FOR VISIT: Events concerning for seizures    HISTORICAL  CHIEF COMPLAINT:  Chief Complaint  Patient presents with   Follow-up    Rm 12. Juanetta with husband. Patient reports everything in terms of night is about the same, maybe a little worse. GYN hormones showed she is not in premenopause.     INTERVAL HISTORY 09/29/2022:  Patient presents today for follow-up, he is accompanied by her husband.  Since last visit, she reports that her symptoms are the same, she has not seen any changes despite being on gabapentin 1200 mg at night and lamotrigine XR 100 mg nightly.  She has completed her ambulatory EEG and was negative.  Husband reports that events usually occur at night sometimes she will have arm jerk like myoclonus, sometimes the jerks are sustained.  They do not have any video of these events.  They are wondering if these events can be secondary to autosomal dominant nocturnal frontal lobe epilepsy since both grandparents had the same type of issue with sleep    HISTORY OF PRESENT ILLNESS:  This is a 47 year old woman with history of restless leg syndrome who is presenting with paroxysmal events described as spacing out, unexplained fall and convulsion during sleep.  She reports on Mar 09 2021, fell out of office chair at work, found by daughter, went to ED. She cannot recall how she fell of the chair. Her work up was negative.  She reports a long history of getting trouble in school for not paying attention, she was even put in Special Ed course but does not have any history of intellectual disability. She did well in school because in order to focus she must write everything down or else she will lose her attention. She has never been work up for ADD.  In her adult life, she reports that she tends to pause during conversation for up to 5  second and she is able to pick back up where she left off. These episodes tend to be worse when she is fatigue, stressed out or sick.   The week of Thanksgiving, she had a unexplained fall at home, and bruises her right ankle. She just found herself on the floor, does not remember how she got there.   In January 2024, she experienced right side of face tingling sensation, then followed by tiredness, no loss of consciousness. She had visual disturbances, disorded sound, was very confused. She was seen in the ED, was given a migraine cocktail and then experienced an episode of convulsion.   Reports that her husband has told her in the past she had convulsion during her sleep, she has no memory of it.   Her work up so far including MRI brain and EEG were normal, she is pending an ambulatory EEG.   She has seen Dr. Lucia Gaskins who started her on Lamotrigine and she reports feeling better on this medication, currently she is on Lamotrigine 75 mg and plan is to go up to 100 mg.     OTHER MEDICAL CONDITIONS: Restless leg syndrome   REVIEW OF SYSTEMS: Full 14 system review of systems performed and negative with exception of: As noted in the HPI   ALLERGIES: Allergies  Allergen Reactions   Sulfa Antibiotics Anaphylaxis   Compazine [Prochlorperazine]     Paradoxical reaction.   Topiramate Other (See Comments)  Suicidal thoughts    HOME MEDICATIONS: Outpatient Medications Prior to Visit  Medication Sig Dispense Refill   Ascorbic Acid (VITAMIN C) POWD Take 1,000 mg by mouth daily.     gabapentin (NEURONTIN) 300 MG capsule Take 2-3 capsules (600-900 mg total) by mouth at bedtime. 270 capsule 1   lamoTRIgine (LAMICTAL XR) 100 MG 24 hour tablet Take 1 tablet (100 mg total) by mouth daily. 30 tablet 11   rOPINIRole (REQUIP) 0.25 MG tablet Take by mouth.     No facility-administered medications prior to visit.    PAST MEDICAL HISTORY: Past Medical History:  Diagnosis Date   Chest pain    Heart  murmur    Heart palpitations    History of stomach ulcers    as a child   MVP (mitral valve prolapse) 10/30/2011   Seizure (HCC)     PAST SURGICAL HISTORY: Past Surgical History:  Procedure Laterality Date   TONSILECTOMY, ADENOIDECTOMY, BILATERAL MYRINGOTOMY AND TUBES     TONSILLECTOMY      FAMILY HISTORY: Family History  Problem Relation Age of Onset   Heart disease Mother    Hypertension Mother    Heart disease Father    Hypertension Father    Breast cancer Neg Hx    Migraines Neg Hx     SOCIAL HISTORY: Social History   Socioeconomic History   Marital status: Married    Spouse name: Not on file   Number of children: Not on file   Years of education: Not on file   Highest education level: Some college, no degree  Occupational History   Occupation: Secretary/administrator  Tobacco Use   Smoking status: Former    Current packs/day: 0.50    Average packs/day: 0.5 packs/day for 10.0 years (5.0 ttl pk-yrs)    Types: Cigarettes   Smokeless tobacco: Never   Tobacco comments:    Little vape   Vaping Use   Vaping status: Every Day   Substances: Flavoring  Substance and Sexual Activity   Alcohol use: Yes    Alcohol/week: 7.0 standard drinks of alcohol    Types: 7 Glasses of wine per week   Drug use: No   Sexual activity: Yes    Birth control/protection: Other-see comments    Comment: spouse-vasectomy  Other Topics Concern   Not on file  Social History Narrative   Not on file   Social Determinants of Health   Financial Resource Strain: Low Risk  (08/18/2022)   Overall Financial Resource Strain (CARDIA)    Difficulty of Paying Living Expenses: Not hard at all  Food Insecurity: No Food Insecurity (08/18/2022)   Hunger Vital Sign    Worried About Running Out of Food in the Last Year: Never true    Ran Out of Food in the Last Year: Never true  Transportation Needs: No Transportation Needs (08/18/2022)   PRAPARE - Administrator, Civil Service (Medical):  No    Lack of Transportation (Non-Medical): No  Physical Activity: Insufficiently Active (08/18/2022)   Exercise Vital Sign    Days of Exercise per Week: 3 days    Minutes of Exercise per Session: 30 min  Stress: Stress Concern Present (08/18/2022)   [employer] of Occupational Health - Occupational Stress Questionnaire    Feeling of Stress : Very much  Social Connections: Moderately Isolated (08/18/2022)   Social Connection and Isolation Panel [NHANES]    Frequency of Communication with Friends and Family: More than three times a week  Frequency of Social Gatherings with Friends and Family: Three times a week    Attends Religious Services: Never    Active Member of Clubs or Organizations: No    Attends Banker Meetings: Not on file    Marital Status: Married  Intimate Partner Violence: Unknown (05/21/2022)   Received from [facility]   HITS    Physically Hurt: Not on file    Insult or Talk Down To: Not on file    Threaten Physical Harm: Not on file    Scream or Curse: Not on file    PHYSICAL EXAM  GENERAL EXAM/CONSTITUTIONAL: Vitals:  Vitals:   09/29/22 1353  BP: 117/73  Pulse: 81  Weight: 182 lb 8 oz (82.8 kg)  Height: 5\' 8"  (1.727 m)   Body mass index is 27.75 kg/m. Wt Readings from Last 3 Encounters:  09/29/22 182 lb 8 oz (82.8 kg)  08/19/22 177 lb (80.3 kg)  05/26/22 175 lb (79.4 kg)   Patient is in no distress; well developed, nourished and groomed; neck is supple  MUSCULOSKELETAL: Gait, strength, tone, movements noted in Neurologic exam below  NEUROLOGIC: MENTAL STATUS:      No data to display         awake, alert, oriented to person, place and time recent and remote memory intact normal attention and concentration language fluent, comprehension intact, naming intact fund of knowledge appropriate  CRANIAL NERVE:  2nd, 3rd, 4th, 6th - Visual fields full to confrontation, extraocular muscles intact, no nystagmus 5th - facial  sensation symmetric 7th - facial strength symmetric 8th - hearing intact 9th - palate elevates symmetrically, uvula midline 11th - shoulder shrug symmetric 12th - tongue protrusion midline  MOTOR:  normal bulk and tone, full strength in the BUE, BLE  SENSORY:  normal and symmetric to light touch  COORDINATION:  finger-nose-finger, fine finger movements normal  REFLEXES:  deep tendon reflexes present and symmetric  GAIT/STATION:  normal     DIAGNOSTIC DATA (LABS, IMAGING, TESTING) - I reviewed patient records, labs, notes, testing and imaging myself where available.  Lab Results  Component Value Date   WBC 6.2 03/30/2022   HGB 14.2 03/30/2022   HCT 42.2 03/30/2022   MCV 90.9 03/30/2022   PLT 259 03/30/2022      Component Value Date/Time   NA 137 03/30/2022 1119   NA 140 08/19/2019 1446   K 3.9 03/30/2022 1119   CL 103 03/30/2022 1119   CO2 21 (L) 03/30/2022 1119   GLUCOSE 91 03/30/2022 1119   BUN 9 03/30/2022 1119   BUN 12 08/19/2019 1446   CREATININE 0.89 03/30/2022 1119   CREATININE 0.81 10/29/2011 1046   CALCIUM 9.3 03/30/2022 1119   PROT 7.0 03/30/2022 1119   PROT 7.3 05/25/2017 1851   ALBUMIN 4.1 03/30/2022 1119   ALBUMIN 4.7 05/25/2017 1851   AST 20 03/30/2022 1119   ALT 21 03/30/2022 1119   ALKPHOS 51 03/30/2022 1119   BILITOT 0.4 03/30/2022 1119   BILITOT 0.3 05/25/2017 1851   GFRNONAA >60 03/30/2022 1119   GFRAA 100 08/19/2019 1446   Lab Results  Component Value Date   CHOL 236 (H) 07/15/2022   HDL 77 07/15/2022   LDLCALC 140 (H) 07/15/2022   TRIG 91 07/15/2022   Lab Results  Component Value Date   HGBA1C 5.1 03/29/2022   Lab Results  Component Value Date   VITAMINB12 409 04/11/2022   Lab Results  Component Value Date   TSH 0.603 03/26/2022  MRI Brain with and without contrast 04/06/2022 This is a normal MRI of the brain with and without contrast.    EEG 03/31/2022 This is a normal EEG recorded while drowsy and awake. No  evidence of interictal epileptiform discharges. Normal EEGs, however, do not rule out epilepsy   48Hours amb eeg 07/08/2022 This is a normal 48 hours ambulatory video EEG. There were no seizures, epileptiform discharge or events captured during this recording. Normal EEGs, however do not rule out epilepsy.   I personally reviewed brain Images and previous EEG reports.   ASSESSMENT AND PLAN  47 y.o. year old female  with history of restless leg syndrome on gabapentin and requip who is presenting for follow up for her jerk like activity. She had a normal MRI, normal routine and ambulatory EEG.  Plan for now is to increase Lamotrigine to 150 mg and ask husband to video tape the events since they are concerned about nocturnal frontal lobe seizures. I will see them in 6 months for follow up or sooner if worse.    1. Seizure-like activity (HCC)      Patient Instructions  Increase Lamotrigine to 150 mg nightly  Continue your other medications  Please video tape the events  Follow up in 6 months or sooner if worse     No orders of the defined types were placed in this encounter.   Meds ordered this encounter  Medications   lamoTRIgine (LAMICTAL XR) 50 MG 24 hour tablet    Sig: Take 1 tablet (50 mg total) by mouth daily.    Dispense:  30 tablet    Refill:  0    Return in about 6 months (around 04/01/2023).   I have spent a total of 40 minutes dedicated to this patient today, preparing to see patient, performing a medically appropriate examination and evaluation, ordering tests and/or medications and procedures, and counseling and educating the patient/family/caregiver; independently interpreting result and communicating results to the family/patient/caregiver; and documenting clinical information in the electronic medical record.    Windell Norfolk, MD 09/29/2022, 4:46 PM  [name] [address] [place], [place] [address] [phone]

## 2022-09-29 NOTE — Patient Instructions (Signed)
Increase Lamotrigine to 150 mg nightly  Continue your other medications  Please video tape the events  Follow up in 6 months or sooner if worse

## 2022-09-30 ENCOUNTER — Encounter (HOSPITAL_COMMUNITY): Payer: Self-pay

## 2022-09-30 ENCOUNTER — Ambulatory Visit (HOSPITAL_COMMUNITY)
Admission: RE | Admit: 2022-09-30 | Discharge: 2022-09-30 | Disposition: A | Discharge status: Home / Self Care | Payer: BC Managed Care – PPO | Source: Ambulatory Visit | Attending: Family Medicine | Admitting: Family Medicine

## 2022-09-30 ENCOUNTER — Ambulatory Visit (HOSPITAL_COMMUNITY): Payer: BC Managed Care – PPO

## 2022-09-30 ENCOUNTER — Telehealth: Payer: BC Managed Care – PPO | Admitting: Physician Assistant

## 2022-09-30 VITALS — BP 112/66 | HR 66 | Temp 98.8°F | Resp 16 | Ht 68.5 in | Wt 170.0 lb

## 2022-09-30 DIAGNOSIS — K047 Periapical abscess without sinus: Secondary | ICD-10-CM | POA: Diagnosis not present

## 2022-09-30 DIAGNOSIS — M273 Alveolitis of jaws: Secondary | ICD-10-CM

## 2022-09-30 MED ORDER — KETOROLAC TROMETHAMINE 10 MG PO TABS: 10.0000 mg | ORAL_TABLET | Freq: Four times a day (QID) | ORAL | 0 refills | Status: DC | PRN

## 2022-09-30 MED ORDER — AMOXICILLIN-POT CLAVULANATE 875-125 MG PO TABS: 1.0000 | ORAL_TABLET | Freq: Two times a day (BID) | ORAL | 0 refills | Status: AC

## 2022-09-30 MED ORDER — KETOROLAC TROMETHAMINE 30 MG/ML IJ SOLN
INTRAMUSCULAR | Status: AC
  Filled 2022-09-30: qty 1

## 2022-09-30 MED ORDER — KETOROLAC TROMETHAMINE 30 MG/ML IJ SOLN
30.0000 mg | Freq: Once | INTRAMUSCULAR | Status: AC
  Administered 2022-09-30: 30 mg via INTRAMUSCULAR

## 2022-09-30 NOTE — Discharge Instructions (Signed)
Take amoxicillin-clavulanate 875 mg--1 tab twice daily with food for 7 days  You have been given a shot of Toradol 30 mg today.  Ketorolac 10 mg tablets--take 1 tablet every 6 hours as needed for pain.  This is the same medicine that is in the shot we just gave you  Please go to the emergency room if you are worsening in any way

## 2022-09-30 NOTE — Progress Notes (Signed)
Because of having dry socket with severe symptoms, I feel your condition warrants further evaluation and I recommend that you be seen in a face to face visit.   NOTE: There will be NO CHARGE for this eVisit   If you are having a true medical emergency please call 911.      For an urgent face to face visit, [facility] has eight urgent care centers for your convenience:   NEW!! [facility] at [facility] Get Driving Directions [phone] [address] [place], [address]    [facility] at [facility] Get Driving Directions [phone] [address] [place], [place] [address]   [facility]) Get Driving Directions [phone] [address] [place], [place] [address]  [facility] Health Urgent Care Center [facility]) Get Driving Directions [phone] [address] [place],  [place]  [address]  [facility] Health [facility] Care Center [facility] - at [employer]  [phone] [address] [place],  [place] [address]   [facility] Care at [facility] Get Driving Directions [phone] [address] [place], [place] [address]   [facility] Health Urgent Care at [facility] Get Driving Directions  [phone] [address] [place], [place] [address]   [facility] Health Urgent Care at [facility] Directions [phone] [address] [place], [place] [address]  Your MyChart E-visit questionnaire answers were reviewed by a board certified advanced clinical practitioner to complete your personal care plan based on your specific symptoms.  Thank you for using e-Visits.   I have spent 5 minutes in review of e-visit questionnaire, review and updating patient chart, medical decision making and response to patient.   [name], PA-C

## 2022-09-30 NOTE — ED Provider Notes (Signed)
[facility]    CSN: [id number] Arrival date & time: 09/30/22  1749      History   Chief Complaint Chief Complaint  Patient presents with   Dental Problem    Entered by patient    HPI Kathryn Brown is a 47 y.o. female.   HPI Here for pain and swelling in her left mouth and upper dental ridge. She had a tooth removed on July 29.  On July 30 she contacted the dentist because she was having a lot of pain.  They saw her yesterday and prescribed rinses and some oral sooth gel.  Nothing is helping the pain.  She is taking Tylenol and ibuprofen.  No fever or chills. Last menstrual cycle was about 3 weeks ago.  She is allergic to sulfa, Compazine, and Topamax.   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   Abnormal dreams 05/26/2022   Myalgia, multiple sites 05/26/2022   Sleep paralysis, recurrent isolated 05/26/2022   Abnormal bruising 05/26/2022   Restless leg syndrome, uncontrolled 04/06/2022   Altered mental status, unspecified 04/06/2022   Memory loss of unknown cause 04/06/2022   Abnormal uterine bleeding (AUB) 09/21/2021   History of loop electrical excision procedure (LEEP) 09/21/2021   Vitamin D deficiency 08/26/2019   MVP (mitral valve prolapse) 10/30/2011    Past Surgical History:  Procedure Laterality Date   TONSILECTOMY, ADENOIDECTOMY, BILATERAL MYRINGOTOMY AND TUBES     TONSILLECTOMY      OB History     Gravida  2   Para  2   Term      Preterm      AB      Living  2      SAB      IAB      Ectopic      Multiple      Live Births               Home Medications    Prior to Admission medications   Medication Sig Start Date End Date Taking? Authorizing Provider  amoxicillin-clavulanate  (AUGMENTIN) 875-125 MG tablet Take 1 tablet by mouth 2 (two) times daily for 7 days. 09/30/22 10/07/22 Yes Kalijah Zeiss, Janace Aris, MD  ketorolac (TORADOL) 10 MG tablet Take 1 tablet (10 mg total) by mouth every 6 (six) hours as needed (pain). 09/30/22  Yes Zenia Resides, MD  Ascorbic Acid (VITAMIN C) POWD Take 1,000 mg by mouth daily.    [provider]  gabapentin (NEURONTIN) 300 MG capsule Take 2-3 capsules (600-900 mg total) by mouth at bedtime. 05/10/22   Anson Fret, MD  lamoTRIgine (LAMICTAL XR) 100 MG 24 hour tablet Take 1 tablet (100 mg total) by mouth daily. 05/23/22 05/18/23  Windell Norfolk, MD  lamoTRIgine (LAMICTAL XR) 50 MG 24 hour tablet Take 1 tablet (50 mg total) by mouth daily. 09/29/22   Windell Norfolk, MD  rOPINIRole (REQUIP) 0.25 MG tablet Take by mouth. 05/24/22   [provider]    Family History Family History  Problem Relation Age of Onset   Heart disease Mother    Hypertension Mother    Heart disease Father    Hypertension  Father    Breast cancer Neg Hx    Migraines Neg Hx     Social History Social History   Tobacco Use   Smoking status: Former    Current packs/day: 0.50    Average packs/day: 0.5 packs/day for 10.0 years (5.0 ttl pk-yrs)    Types: Cigarettes   Smokeless tobacco: Never   Tobacco comments:    Little vape   Vaping Use   Vaping status: Every Day   Substances: Flavoring  Substance Use Topics   Alcohol use: Yes    Alcohol/week: 7.0 standard drinks of alcohol    Types: 7 Glasses of wine per week   Drug use: No     Allergies   Sulfa antibiotics, Compazine [prochlorperazine], and Topiramate   Review of Systems Review of Systems   Physical Exam Triage Vital Signs ED Triage Vitals  Encounter Vitals Group     BP 09/30/22 1809 112/66     Systolic BP Percentile --      Diastolic BP Percentile --      Pulse Rate 09/30/22 1809 66     Resp 09/30/22 1809 16     Temp 09/30/22 1809 98.8 F (37.1 C)     Temp Source 09/30/22  1809 Oral     SpO2 09/30/22 1809 97 %     Weight 09/30/22 1809 170 lb (77.1 kg)     Height 09/30/22 1809 5' 8.5" (1.74 m)     Head Circumference --      Peak Flow --      Pain Score 09/30/22 1808 10     Pain Loc --      Pain Education --      Exclude from Growth Chart --    No data found.  Updated Vital Signs BP 112/66 (BP Location: Right Arm)   Pulse 66   Temp 98.8 F (37.1 C) (Oral)   Resp 16   Ht 5' 8.5" (1.74 m)   Wt 77.1 kg   LMP 09/09/2022 (Approximate)   SpO2 97%   BMI 25.47 kg/m   Visual Acuity Right Eye Distance:   Left Eye Distance:   Bilateral Distance:    Right Eye Near:   Left Eye Near:    Bilateral Near:     Physical Exam Vitals reviewed.  Constitutional:      General: She is not in acute distress.    Appearance: She is not ill-appearing, toxic-appearing or diaphoretic.  HENT:     Mouth/Throat:     Mouth: Mucous membranes are moist.     Pharynx: No oropharyngeal exudate or posterior oropharyngeal erythema.     Comments: The device pocket is evident at the inferior left upper dental ridge.  There is a little bit of swelling on the medial side.  No drainage on exam, but she does show me a little bit of purulent material on the gauze she had in the socket.   Cardiovascular:     Rate and Rhythm: Normal rate and regular rhythm.     Heart sounds: No murmur heard. Pulmonary:     Effort: Pulmonary effort is normal.     Breath sounds: Normal breath sounds.  Musculoskeletal:     Cervical back: Neck supple.  Lymphadenopathy:     Cervical: No cervical adenopathy.  Skin:    Coloration: Skin is not pale.  Neurological:     General: No focal deficit present.     Mental Status: She is alert and oriented to person, place, and time.  Psychiatric:        Behavior: Behavior normal.      UC Treatments / Results  Labs (all labs ordered are listed, but only abnormal results are displayed) Labs Reviewed - No data to display  EKG   Radiology No  results found.  Procedures Procedures (including critical care time)  Medications Ordered in UC Medications  ketorolac (TORADOL) 30 MG/ML injection 30 mg (has no administration in time range)    Initial Impression / Assessment and Plan / UC Course  I have reviewed the triage vital signs and the nursing notes.  Pertinent labs & imaging results that were available during my care of the patient were reviewed by me and considered in my medical decision making (see chart for details).        Augmentin is sent in to treat infection, Toradol is given here as an injection and pills of Toradol are sent to the pharmacy.  I have asked her to proceed to the emergency room if this is worsening Final Clinical Impressions(s) / UC Diagnoses   Final diagnoses:  Dental infection     Discharge Instructions      Take amoxicillin-clavulanate 875 mg--1 tab twice daily with food for 7 days  You have been given a shot of Toradol 30 mg today.  Ketorolac 10 mg tablets--take 1 tablet every 6 hours as needed for pain.  This is the same medicine that is in the shot we just gave you  Please go to the emergency room if you are worsening in any way     ED Prescriptions     Medication Sig Dispense Auth. Provider   ketorolac (TORADOL) 10 MG tablet Take 1 tablet (10 mg total) by mouth every 6 (six) hours as needed (pain). 20 tablet Zenia Resides, MD   amoxicillin-clavulanate (AUGMENTIN) 875-125 MG tablet Take 1 tablet by mouth 2 (two) times daily for 7 days. 14 tablet Lamica Mccart, Janace Aris, MD      PDMP not reviewed this encounter.   Zenia Resides, MD 09/30/22 [phone]

## 2022-09-30 NOTE — ED Triage Notes (Signed)
Patient here today with c/o upper left side dental pain since Tuesday after having wisdom teeth removed on Monday. Patient went into the dental office and was informed that she had a dry socket. They used Ora Soothe gel and gave her the remaining syringe of the gel to use at home for comfort but not helping. Patient states that she has noticed some discharge.

## 2022-10-13 ENCOUNTER — Encounter (INDEPENDENT_AMBULATORY_CARE_PROVIDER_SITE_OTHER): Payer: Self-pay

## 2022-10-28 ENCOUNTER — Other Ambulatory Visit: Payer: Self-pay | Admitting: Neurology

## 2022-11-10 ENCOUNTER — Encounter: Payer: BC Managed Care – PPO | Admitting: Family Medicine

## 2022-11-15 ENCOUNTER — Encounter: Payer: BC Managed Care – PPO | Admitting: Family Medicine

## 2022-12-28 ENCOUNTER — Telehealth: Payer: BC Managed Care – PPO | Admitting: Physician Assistant

## 2022-12-28 ENCOUNTER — Other Ambulatory Visit: Payer: Self-pay | Admitting: Physician Assistant

## 2022-12-28 ENCOUNTER — Encounter: Payer: Self-pay | Admitting: Physician Assistant

## 2022-12-28 DIAGNOSIS — J069 Acute upper respiratory infection, unspecified: Secondary | ICD-10-CM

## 2022-12-28 MED ORDER — BENZONATATE 100 MG PO CAPS: 100.0000 mg | ORAL_CAPSULE | Freq: Three times a day (TID) | ORAL | 0 refills | Status: DC | PRN

## 2022-12-28 MED ORDER — ALBUTEROL SULFATE HFA 108 (90 BASE) MCG/ACT IN AERS: 2.0000 | INHALATION_SPRAY | Freq: Four times a day (QID) | RESPIRATORY_TRACT | 0 refills | Status: DC | PRN

## 2022-12-28 MED ORDER — AZITHROMYCIN 250 MG PO TABS: ORAL_TABLET | ORAL | 0 refills | Status: AC

## 2022-12-28 NOTE — Patient Instructions (Signed)
Kathryn Brown, thank you for joining [name], PA-C for today's virtual visit.  While this provider is not your primary care provider (PCP), if your PCP is located in our provider database this encounter information will be shared with them immediately following your visit.   A [facility] MyChart account gives you access to today's visit and all your visits, tests, and labs performed at [facility] " click here if you don't have a [facility] MyChart account or go to [email or web address]  Consent: (Patient) Kathryn Brown provided verbal consent for this virtual visit at the beginning of the encounter.  Current Medications:  Current Outpatient Medications:    Ascorbic Acid (VITAMIN C) POWD, Take 1,000 mg by mouth daily., Disp: , Rfl:    gabapentin (NEURONTIN) 300 MG capsule, Take 2-3 capsules (600-900 mg total) by mouth at bedtime., Disp: 270 capsule, Rfl: 1   ketorolac (TORADOL) 10 MG tablet, Take 1 tablet (10 mg total) by mouth every 6 (six) hours as needed (pain)., Disp: 20 tablet, Rfl: 0   lamoTRIgine (LAMICTAL XR) 100 MG 24 hour tablet, Take 1 tablet (100 mg total) by mouth daily., Disp: 30 tablet, Rfl: 11   lamoTRIgine (LAMICTAL XR) 50 MG 24 hour tablet, TAKE 1 TABLET(50 MG) BY MOUTH DAILY, Disp: 30 tablet, Rfl: 5   rOPINIRole (REQUIP) 0.25 MG tablet, Take by mouth., Disp: , Rfl:    Medications ordered in this encounter:  No orders of the defined types were placed in this encounter.    *If you need refills on other medications prior to your next appointment, please contact your pharmacy*  Follow-Up: Call back or seek an in-person evaluation if the symptoms worsen or if the condition fails to improve as anticipated.  [facility] [phone]  Other Instructions Take antibiotic (Azithromycin) as directed.  Increase fluids.  Get plenty of rest. Use Mucinex for congestion. Use the Tessalon and albuterol as directed. Take a daily probiotic (I  recommend Align or Culturelle, but even Activia Yogurt may be beneficial).  A humidifier placed in the bedroom may offer some relief for a dry, scratchy throat of nasal irritation.  Read information below on acute bronchitis. Please call or return to clinic if symptoms are not improving.  Acute Bronchitis Bronchitis is when the airways that extend from the windpipe into the lungs get red, puffy, and painful (inflamed). Bronchitis often causes thick spit (mucus) to develop. This leads to a cough. A cough is the most common symptom of bronchitis. In acute bronchitis, the condition usually begins suddenly and goes away over time (usually in 2 weeks). Smoking, allergies, and asthma can make bronchitis worse. Repeated episodes of bronchitis may cause more lung problems.  HOME CARE Rest. Drink enough fluids to keep your pee (urine) clear or pale yellow (unless you need to limit fluids as told by your doctor). Only take over-the-counter or prescription medicines as told by your doctor. Avoid smoking and secondhand smoke. These can make bronchitis worse. If you are a smoker, think about using nicotine gum or skin patches. Quitting smoking will help your lungs heal faster. Reduce the chance of getting bronchitis again by: Washing your hands often. Avoiding people with cold symptoms. Trying not to touch your hands to your mouth, nose, or eyes. Follow up with your doctor as told.  GET HELP IF: Your symptoms do not improve after 1 week of treatment. Symptoms include: Cough. Fever. Coughing up thick spit. Body aches. Chest congestion. Chills. Shortness of  breath. Sore throat.  GET HELP RIGHT AWAY IF:  You have an increased fever. You have chills. You have severe shortness of breath. You have bloody thick spit (sputum). You throw up (vomit) often. You lose too much body fluid (dehydration). You have a severe headache. You faint.  MAKE SURE YOU:  Understand these instructions. Will watch  your condition. Will get help right away if you are not doing well or get worse. Document Released: 08/03/2007 Document Revised: 10/17/2012 Document Reviewed: 08/07/2012 [facility] Patient Information 2015 [place], [place]. This information is not intended to replace advice given to you by your health care provider. Make sure you discuss any questions you have with your health care provider.    If you have been instructed to have an in-person evaluation today at a local Urgent Care facility, please use the link below. It will take you to a list of all of our available [facility], including address, phone number and hours of operation. Please do not delay care.  [facility]  If you or a family member do not have a primary care provider, use the link below to schedule a visit and establish care. When you choose a [facility] primary care physician or advanced practice provider, you gain a long-term partner in health. Find a Primary Care Provider  Learn more about [facility]'s in-office and virtual care options: [facility] - Get Care Now

## 2022-12-28 NOTE — Progress Notes (Signed)
Virtual Visit Consent   Kathryn Brown, you are scheduled for a virtual visit with a [facility] provider today. Just as with appointments in the office, your consent must be obtained to participate. Your consent will be active for this visit and any virtual visit you may have with one of our providers in the next 365 days. If you have a MyChart account, a copy of this consent can be sent to you electronically.  As this is a virtual visit, video technology does not allow for your provider to perform a traditional examination. This may limit your provider's ability to fully assess your condition. If your provider identifies any concerns that need to be evaluated in person or the need to arrange testing (such as labs, EKG, etc.), we will make arrangements to do so. Although advances in technology are sophisticated, we cannot ensure that it will always work on either your end or our end. If the connection with a video visit is poor, the visit may have to be switched to a telephone visit. With either a video or telephone visit, we are not always able to ensure that we have a secure connection.  By engaging in this virtual visit, you consent to the provision of healthcare and authorize for your insurance to be billed (if applicable) for the services provided during this visit. Depending on your insurance coverage, you may receive a charge related to this service.  I need to obtain your verbal consent now. Are you willing to proceed with your visit today? Kathryn Brown has provided verbal consent on 12/28/2022 for a virtual visit (video or telephone). Kathryn Brown, [place]  Date: 12/28/2022 11:03 AM  Virtual Visit via Video Note   I, Kathryn Brown, connected with  Kathryn Brown  ([id number], April 18, 1975) on 12/28/22 at  8:30 AM EDT by a video-enabled telemedicine application and verified that I am speaking with the correct person using two identifiers.  Location: Patient: Virtual Visit Location  Patient: Home Provider: Virtual Visit Location Provider: Home Office   I discussed the limitations of evaluation and management by telemedicine and the availability of in person appointments. The patient expressed understanding and agreed to proceed.    History of Present Illness: Kathryn Brown is a 47 y.o. who identifies as a female who was assigned female at birth, and is being seen today for symptoms starting Sunday. Noted fatigue with her sleeping most of the day which she notes is out of character for her. She felt was related to having to go non-stop for the past 3 weeks and that was her first day she was able to relax/rest. Since then however, noted nasal and head congestion starting Sunday night. Monday started with foggy headedness, continued sinus congestion, Fever starting Monday night into Tuesday. Notes sweats, chills and increased chest congestion with some chest wall tenderness with deep breaths and coughing starting last night. Took a long shower to help break up congestion which let her get some out and sleep better. This morning, feeling just as bad as well along with some chest tightness with exertion. Has not tested for COVID.   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   Abnormal dreams 05/26/2022   Myalgia, multiple sites 05/26/2022   Sleep paralysis, recurrent isolated 05/26/2022   Abnormal bruising 05/26/2022   Restless leg syndrome, uncontrolled 04/06/2022   Altered mental status, unspecified 04/06/2022  Memory loss of unknown cause 04/06/2022   Abnormal uterine bleeding (AUB) 09/21/2021   History of loop electrical excision procedure (LEEP) 09/21/2021   Vitamin D deficiency 08/26/2019   MVP (mitral valve prolapse) 10/30/2011    Allergies:  Allergies  Allergen Reactions   Sulfa Antibiotics Anaphylaxis   Compazine [Prochlorperazine]     Paradoxical reaction.   Topiramate Other (See Comments)     Suicidal thoughts   Medications:  Current Outpatient Medications:    albuterol (VENTOLIN HFA) 108 (90 Base) MCG/ACT inhaler, Inhale 2 puffs into the lungs every 6 (six) hours as needed for wheezing or shortness of breath., Disp: 8 g, Rfl: 0   benzonatate (TESSALON) 100 MG capsule, Take 1 capsule (100 mg total) by mouth 3 (three) times daily as needed for cough., Disp: 30 capsule, Rfl: 0   Ascorbic Acid (VITAMIN C) POWD, Take 1,000 mg by mouth daily., Disp: , Rfl:    gabapentin (NEURONTIN) 300 MG capsule, Take 2-3 capsules (600-900 mg total) by mouth at bedtime., Disp: 270 capsule, Rfl: 1   ketorolac (TORADOL) 10 MG tablet, Take 1 tablet (10 mg total) by mouth every 6 (six) hours as needed (pain)., Disp: 20 tablet, Rfl: 0   lamoTRIgine (LAMICTAL XR) 100 MG 24 hour tablet, Take 1 tablet (100 mg total) by mouth daily., Disp: 30 tablet, Rfl: 11   lamoTRIgine (LAMICTAL XR) 50 MG 24 hour tablet, TAKE 1 TABLET(50 MG) BY MOUTH DAILY, Disp: 30 tablet, Rfl: 5   rOPINIRole (REQUIP) 0.25 MG tablet, Take by mouth., Disp: , Rfl:   Observations/Objective: Patient is well-developed, well-nourished in no acute distress.  Resting comfortably at home.  Head is normocephalic, atraumatic.  No labored breathing. Speech is clear and coherent with logical content.  Patient is alert and oriented at baseline.   Assessment and Plan: 1. Upper respiratory tract infection, unspecified type - albuterol (VENTOLIN HFA) 108 (90 Base) MCG/ACT inhaler; Inhale 2 puffs into the lungs every 6 (six) hours as needed for wheezing or shortness of breath.  Dispense: 8 g; Refill: 0 - benzonatate (TESSALON) 100 MG capsule; Take 1 capsule (100 mg total) by mouth 3 (three) times daily as needed for cough.  Dispense: 30 capsule; Refill: 0  Had her test for COVID as a precaution. This was negative. As such will add on Azithromycin to take as directed due to concern for bacterial etiology. Supportive measures and OTC medications  reviewed. Tessalon and albuterol per orders. Strict in person precautions reviewed.   Follow Up Instructions: I discussed the assessment and treatment plan with the patient. The patient was provided an opportunity to ask questions and all were answered. The patient agreed with the plan and demonstrated an understanding of the instructions.  A copy of instructions were sent to the patient via MyChart unless otherwise noted below.   The patient was advised to call back or seek an in-person evaluation if the symptoms worsen or if the condition fails to improve as anticipated.    Kathryn Climes, PA-C

## 2023-01-03 ENCOUNTER — Encounter: Payer: BC Managed Care – PPO | Admitting: Family Medicine

## 2023-02-06 ENCOUNTER — Telehealth: Payer: BC Managed Care – PPO

## 2023-02-09 ENCOUNTER — Encounter: Payer: Self-pay | Admitting: Neurology

## 2023-02-09 ENCOUNTER — Other Ambulatory Visit: Payer: Self-pay | Admitting: Neurology

## 2023-03-09 ENCOUNTER — Telehealth: Payer: Self-pay

## 2023-03-09 NOTE — Telephone Encounter (Signed)
 Lmtr[facility] need to r/s appt provider out of office 04/05/23-04/07/23

## 2023-03-10 NOTE — Telephone Encounter (Signed)
 Called and r/s pt as requested

## 2023-03-16 ENCOUNTER — Telehealth: Payer: BC Managed Care – PPO | Admitting: Neurology

## 2023-03-16 ENCOUNTER — Encounter: Payer: Self-pay | Admitting: Neurology

## 2023-03-16 DIAGNOSIS — G2581 Restless legs syndrome: Secondary | ICD-10-CM

## 2023-03-16 DIAGNOSIS — G4761 Periodic limb movement disorder: Secondary | ICD-10-CM | POA: Diagnosis not present

## 2023-03-16 MED ORDER — TRAZODONE HCL 50 MG PO TABS: 50.0000 mg | ORAL_TABLET | Freq: Every day | ORAL | 0 refills | Status: DC

## 2023-03-16 MED ORDER — ROPINIROLE HCL 0.25 MG PO TABS: 0.2500 mg | ORAL_TABLET | Freq: Two times a day (BID) | ORAL | 11 refills | Status: DC

## 2023-03-16 NOTE — Progress Notes (Addendum)
 [name]  PATIENT: Kathryn Brown DOB: 1975/05/25  REQUESTING CLINICIAN: Billy Philippe SAUNDERS, NP HISTORY FROM: Patient  REASON FOR VISIT: Events concerning for seizures    HISTORICAL  CHIEF COMPLAINT:  Chief Complaint  Patient presents with   Tremors    RLS/ Seizure like activity follow up   INTERVAL HISTORY 03/16/2023:  Patient was called today for a video visit. Husband was present. She tells me since last visit, she feels like her symptoms are getting worse:  Problem getting consistent amount of sleep. She wakes up in the middle of the night to move around.  Leg shake during sleep, affecting her sleep, Not able to get quality of sleep  Tremors  Arm movements Hearing things  Vision problems Having hard time during the day, not able to function because she is not getting enough sleep During the day, she feels like she has electrical activity going thru her body. She has increased her dose of Requip  with good benefit of the RLS but the next morning, she feels sluggish and sleepy. She has also used hydroxyzine  and Opioid which have helped.  Increase stress related to work  Presyncopal feeling but has not had a syncopal episode  Tells me that she still has ?somnambulism, will wake in a different part of the house, not knowing how she got there. She was recommended a lab sleep study but insurance did not approved. Home sleep study was ordered but not completed.    INTERVAL HISTORY 09/29/2022:  Patient presents today for follow-up, he is accompanied by her husband.  Since last visit, she reports that her symptoms are the same, she has not seen any changes despite being on gabapentin  1200 mg at night and lamotrigine  XR 100 mg nightly.  She has completed her ambulatory EEG and was negative.  Husband reports that events usually occur at night sometimes she will have arm jerk like myoclonus, sometimes the jerks are sustained.  They do not have any video of these  events.  They are wondering if these events can be secondary to autosomal dominant nocturnal frontal lobe epilepsy since both grandparents had the same type of issue with sleep   HISTORY OF PRESENT ILLNESS:  This is a 48 year old woman with history of restless leg syndrome who is presenting with paroxysmal events described as spacing out, unexplained fall and convulsion during sleep.  She reports on Mar 09 2021, fell out of office chair at work, found by daughter, went to ED. She cannot recall how she fell of the chair. Her work up was negative.  She reports a long history of getting trouble in school for not paying attention, she was even put in Special Ed course but does not have any history of intellectual disability. She did well in school because in order to focus she must write everything down or else she will lose her attention. She has never been work up for ADD.  In her adult life, she reports that she tends to pause during conversation for up to 5 second and she is able to pick back up where she left off. These episodes tend to be worse when she is fatigue, stressed out or sick.   The week of Thanksgiving, she had a unexplained fall at home, and bruises her right ankle. She just found herself on the floor, does not remember how she got there.   In January 2024, she experienced right side of face tingling sensation, then followed by tiredness, no  loss of consciousness. She had visual disturbances, disorded sound, was very confused. She was seen in the ED, was given a migraine cocktail and then experienced an episode of convulsion.   Reports that her husband has told her in the past she had convulsion during her sleep, she has no memory of it.   Her work up so far including MRI brain and EEG were normal, she is pending an ambulatory EEG.   She has seen Dr. Ines who started her on Lamotrigine  and she reports feeling better on this medication, currently she is on Lamotrigine  75 mg and plan is  to go up to 100 mg.     OTHER MEDICAL CONDITIONS: Restless leg syndrome   REVIEW OF SYSTEMS: Full 14 system review of systems performed and negative with exception of: As noted in the HPI   ALLERGIES: Allergies  Allergen Reactions   Sulfa Antibiotics Anaphylaxis   Compazine  [Prochlorperazine ]     Paradoxical reaction.   Topiramate  Other (See Comments)    Suicidal thoughts    HOME MEDICATIONS: Outpatient Medications Prior to Visit  Medication Sig Dispense Refill   albuterol  (VENTOLIN  HFA) 108 (90 Base) MCG/ACT inhaler Inhale 2 puffs into the lungs every 6 (six) hours as needed for wheezing or shortness of breath. 8 g 0   Ascorbic Acid (VITAMIN C) POWD Take 1,000 mg by mouth daily.     benzonatate  (TESSALON ) 100 MG capsule Take 1 capsule (100 mg total) by mouth 3 (three) times daily as needed for cough. 30 capsule 0   gabapentin  (NEURONTIN ) 300 MG capsule TAKE 2 TO 3 CAPSULES(600 TO 900 MG) BY MOUTH AT BEDTIME 270 capsule 1   ketorolac  (TORADOL ) 10 MG tablet Take 1 tablet (10 mg total) by mouth every 6 (six) hours as needed (pain). 20 tablet 0   lamoTRIgine  (LAMICTAL  XR) 100 MG 24 hour tablet Take 1 tablet (100 mg total) by mouth daily. 30 tablet 11   lamoTRIgine  (LAMICTAL  XR) 50 MG 24 hour tablet TAKE 1 TABLET(50 MG) BY MOUTH DAILY 30 tablet 5   rOPINIRole  (REQUIP ) 0.25 MG tablet Take by mouth.     No facility-administered medications prior to visit.    PAST MEDICAL HISTORY: Past Medical History:  Diagnosis Date   Chest pain    Heart murmur    Heart palpitations    History of stomach ulcers    as a child   MVP (mitral valve prolapse) 10/30/2011   Seizure (HCC)     PAST SURGICAL HISTORY: Past Surgical History:  Procedure Laterality Date   TONSILECTOMY, ADENOIDECTOMY, BILATERAL MYRINGOTOMY AND TUBES     TONSILLECTOMY      FAMILY HISTORY: Family History  Problem Relation Age of Onset   Heart disease Mother    Hypertension Mother    Heart disease Father     Hypertension Father    Breast cancer Neg Hx    Migraines Neg Hx     SOCIAL HISTORY: Social History   Socioeconomic History   Marital status: Married    Spouse name: Not on file   Number of children: Not on file   Years of education: Not on file   Highest education level: Some college, no degree  Occupational History   Occupation: Secretary/administrator  Tobacco Use   Smoking status: Former    Current packs/day: 0.50    Average packs/day: 0.5 packs/day for 10.0 years (5.0 ttl pk-yrs)    Types: Cigarettes   Smokeless tobacco: Never   Tobacco comments:  Little vape   Vaping Use   Vaping status: Every Day   Substances: Flavoring  Substance and Sexual Activity   Alcohol use: Yes    Alcohol/week: 7.0 standard drinks of alcohol    Types: 7 Glasses of wine per week   Drug use: No   Sexual activity: Yes    Birth control/protection: Other-see comments    Comment: spouse-vasectomy  Other Topics Concern   Not on file  Social History Narrative   Not on file   Social Drivers of Health   Financial Resource Strain: Low Risk  (08/18/2022)   Overall Financial Resource Strain (CARDIA)    Difficulty of Paying Living Expenses: Not hard at all  Food Insecurity: No Food Insecurity (08/18/2022)   Hunger Vital Sign    Worried About Running Out of Food in the Last Year: Never true    Ran Out of Food in the Last Year: Never true  Transportation Needs: No Transportation Needs (08/18/2022)   PRAPARE - Administrator, Civil Service (Medical): No    Lack of Transportation (Non-Medical): No  Physical Activity: Insufficiently Active (08/18/2022)   Exercise Vital Sign    Days of Exercise per Week: 3 days    Minutes of Exercise per Session: 30 min  Stress: Stress Concern Present (08/18/2022)   [employer] of Occupational Health - Occupational Stress Questionnaire    Feeling of Stress : Very much  Social Connections: Moderately Isolated (08/18/2022)   Social Connection and  Isolation Panel [NHANES]    Frequency of Communication with Friends and Family: More than three times a week    Frequency of Social Gatherings with Friends and Family: Three times a week    Attends Religious Services: Never    Active Member of Clubs or Organizations: No    Attends Banker Meetings: Not on file    Marital Status: Married  Intimate Partner Violence: Unknown (05/21/2022)   Received from [employer], [facility]   HITS    Physically Hurt: Not on file    Insult or Talk Down To: Not on file    Threaten Physical Harm: Not on file    Scream or Curse: Not on file    PHYSICAL EXAM  GENERAL EXAM/CONSTITUTIONAL: Vitals:  There were no vitals filed for this visit.  There is no height or weight on file to calculate BMI. Wt Readings from Last 3 Encounters:  09/30/22 170 lb (77.1 kg)  09/29/22 182 lb 8 oz (82.8 kg)  08/19/22 177 lb (80.3 kg)   Patient is in no distress; well developed, nourished and groomed; neck is supple  MUSCULOSKELETAL: Gait, strength, tone, movements noted in Neurologic exam below  NEUROLOGIC: MENTAL STATUS:      No data to display         awake, alert, oriented to person, place and time recent and remote memory intact normal attention and concentration language fluent, comprehension intact, naming intact fund of knowledge appropriate  CRANIAL NERVE:  2nd, 3rd, 4th, 6th - Visual fields full to confrontation, extraocular muscles intact, no nystagmus 5th - facial sensation symmetric 7th - facial strength symmetric 8th - hearing intact 9th - palate elevates symmetrically, uvula midline 11th - shoulder shrug symmetric 12th - tongue protrusion midline  MOTOR:  normal bulk and tone, full strength in the BUE, BLE  SENSORY:  normal and symmetric to light touch  COORDINATION:  finger-nose-finger, fine finger movements normal  REFLEXES:  deep tendon reflexes present and symmetric  GAIT/STATION:  normal     DIAGNOSTIC DATA (LABS, IMAGING, TESTING) - I reviewed patient records, labs, notes, testing and imaging myself where available.  Lab Results  Component Value Date   WBC 6.2 03/30/2022   HGB 14.2 03/30/2022   HCT 42.2 03/30/2022   MCV 90.9 03/30/2022   PLT 259 03/30/2022      Component Value Date/Time   NA 137 03/30/2022 1119   NA 140 08/19/2019 1446   K 3.9 03/30/2022 1119   CL 103 03/30/2022 1119   CO2 21 (L) 03/30/2022 1119   GLUCOSE 91 03/30/2022 1119   BUN 9 03/30/2022 1119   BUN 12 08/19/2019 1446   CREATININE 0.89 03/30/2022 1119   CREATININE 0.81 10/29/2011 1046   CALCIUM 9.3 03/30/2022 1119   PROT 7.0 03/30/2022 1119   PROT 7.3 05/25/2017 1851   ALBUMIN 4.1 03/30/2022 1119   ALBUMIN 4.7 05/25/2017 1851   AST 20 03/30/2022 1119   ALT 21 03/30/2022 1119   ALKPHOS 51 03/30/2022 1119   BILITOT 0.4 03/30/2022 1119   BILITOT 0.3 05/25/2017 1851   GFRNONAA >60 03/30/2022 1119   GFRAA 100 08/19/2019 1446   Lab Results  Component Value Date   CHOL 236 (H) 07/15/2022   HDL 77 07/15/2022   LDLCALC 140 (H) 07/15/2022   TRIG 91 07/15/2022   Lab Results  Component Value Date   HGBA1C 5.1 03/29/2022   Lab Results  Component Value Date   VITAMINB12 409 04/11/2022   Lab Results  Component Value Date   TSH 0.603 03/26/2022    MRI Brain with and without contrast 04/06/2022 This is a normal MRI of the brain with and without contrast.   EEG 03/31/2022 This is a normal EEG recorded while drowsy and awake. No evidence of interictal epileptiform discharges. Normal EEGs, however, do not rule out epilepsy   48Hours amb eeg 07/08/2022 This is a normal 48 hours ambulatory video EEG. There were no seizures, epileptiform discharge or events captured during this recording. Normal EEGs, however do not rule out epilepsy.   I personally reviewed brain Images and previous EEG reports.   ASSESSMENT AND PLAN  48 y.o. year old female  with history of restless leg  syndrome on gabapentin  and requip  who is presenting for follow up for her jerk like activity. She had a normal MRI, normal routine and ambulatory EEG. She is also reporting the ?RLS is affecting her sleep. Informed patient that I do suspect these events to be epileptic, most likely this is her RLS, periodic limb movement during sleep. Will continue her on Lamotrigine , advised her to decrease her Gabapentin  since she feels it is not beneficial and increase her Requip  to twice daily. Will also give her a trial of Trazodone . I will see you 6 months for follow up.   Addendum 09/19/2023: I do not suspect these events to epileptic.   1. RLS (restless legs syndrome)   2. Periodic limb movements of sleep       Patient Instructions  Increase Requip  to 0.25 mg twice daily, monitor side effect  Decrease Gabapentin  to BID instead of TID  Continue your other medications  Return in 6 months or sooner if worse      No orders of the defined types were placed in this encounter.   Meds ordered this encounter  Medications   rOPINIRole  (REQUIP ) 0.25 MG tablet    Sig: Take 1 tablet (0.25 mg total) by mouth in the morning and at bedtime.    Dispense:  60 tablet    Refill:  11   traZODone  (DESYREL ) 50 MG tablet    Sig: Take 1 tablet (50 mg total) by mouth at bedtime.    Dispense:  30 tablet    Refill:  0    Return in about 6 months (around 09/13/2023).  Virtual Visit via Video Note  I connected with  Kathryn Brown on 03/16/23 at  7:45 AM EST by a video enabled telemedicine application and verified that I am speaking with the correct person using two identifiers.  Location: Patient: home Provider: [facility] Office   I discussed the limitations of evaluation and management by telemedicine and the availability of in person appointments. The patient expressed understanding and agreed to proceed.   I discussed the assessment and treatment plan with the patient. The patient was provided an opportunity to  ask questions and all were answered. The patient agreed with the plan and demonstrated an understanding of the instructions.   The patient was advised to call back or seek an in-person evaluation if the symptoms worsen or if the condition fails to improve as anticipated.  I provided 30 minutes of non-face-to-face time during this encounter.   I have spent a total of 30 minutes dedicated to this patient today, preparing to see patient, performing a medically appropriate examination and evaluation, ordering tests and/or medications and procedures, and counseling and educating the patient/family/caregiver; independently interpreting result and communicating results to the family/patient/caregiver; and documenting clinical information in the electronic medical record.   Pastor Falling, MD 03/16/2023, 1:33 PM  [name] [address], [address] [place], [place] [address] [phone]

## 2023-03-16 NOTE — Patient Instructions (Signed)
Increase Requip to 0.25 mg twice daily, monitor side effect  Decrease Gabapentin to BID instead of TID  Continue your other medications  Return in 6 months or sooner if worse

## 2023-03-20 ENCOUNTER — Telehealth: Payer: BC Managed Care – PPO | Admitting: Physician Assistant

## 2023-03-20 ENCOUNTER — Encounter: Payer: Self-pay | Admitting: Physician Assistant

## 2023-03-20 DIAGNOSIS — J069 Acute upper respiratory infection, unspecified: Secondary | ICD-10-CM

## 2023-03-20 MED ORDER — PSEUDOEPH-BROMPHEN-DM 30-2-10 MG/5ML PO SYRP
5.0000 mL | ORAL_SOLUTION | Freq: Four times a day (QID) | ORAL | 0 refills | Status: DC | PRN
Start: 2023-03-20 — End: 2023-06-13

## 2023-03-20 MED ORDER — IPRATROPIUM BROMIDE 0.03 % NA SOLN: 2.0000 | Freq: Two times a day (BID) | NASAL | 0 refills | Status: DC

## 2023-03-20 NOTE — Progress Notes (Signed)
Virtual Visit Consent   RIGBY GROVE, you are scheduled for a virtual visit with a [facility] provider today. Just as with appointments in the office, your consent must be obtained to participate. Your consent will be active for this visit and any virtual visit you may have with one of our providers in the next 365 days. If you have a MyChart account, a copy of this consent can be sent to you electronically.  As this is a virtual visit, video technology does not allow for your provider to perform a traditional examination. This may limit your provider's ability to fully assess your condition. If your provider identifies any concerns that need to be evaluated in person or the need to arrange testing (such as labs, EKG, etc.), we will make arrangements to do so. Although advances in technology are sophisticated, we cannot ensure that it will always work on either your end or our end. If the connection with a video visit is poor, the visit may have to be switched to a telephone visit. With either a video or telephone visit, we are not always able to ensure that we have a secure connection.  By engaging in this virtual visit, you consent to the provision of healthcare and authorize for your insurance to be billed (if applicable) for the services provided during this visit. Depending on your insurance coverage, you may receive a charge related to this service.  I need to obtain your verbal consent now. Are you willing to proceed with your visit today? Kathryn Brown has provided verbal consent on 03/20/2023 for a virtual visit (video or telephone). Kathryn Loveless, PA-C  Date: 03/20/2023 12:43 PM  Virtual Visit via Video Note   I, Kathryn Brown, connected with  Kathryn Brown  ([id number], 1976/02/08) on 03/20/23 at 12:30 PM EST by a video-enabled telemedicine application and verified that I am speaking with the correct person using two identifiers.  Location: Patient: Virtual Visit Location  Patient: Home Provider: Virtual Visit Location Provider: Home Office   I discussed the limitations of evaluation and management by telemedicine and the availability of in person appointments. The patient expressed understanding and agreed to proceed.    History of Present Illness: Kathryn Brown is a 48 y.o. who identifies as a female who was assigned female at birth, and is being seen today for URI symptoms.  HPI: URI  This is a new problem. The current episode started yesterday. The problem has been gradually worsening. The maximum temperature recorded prior to her arrival was 100.4 - 100.9 F. The fever has been present for Less than 1 day. Associated symptoms include congestion, coughing, headaches, nausea, a plugged ear sensation (right, popping and fullness), rhinorrhea (and post nasal drainage), sinus pain (frontal) and a sore throat (tickle started then increased). Pertinent negatives include no chest pain, diarrhea, ear pain or vomiting. Associated symptoms comments: Watery eyes, body aches, fatigue. Treatments tried: tea, brandy, advil, aleve. The treatment provided no relief.    Has had a few sick contacts.  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   Abnormal dreams 05/26/2022   Myalgia, multiple sites 05/26/2022   Sleep paralysis, recurrent isolated 05/26/2022   Abnormal bruising 05/26/2022   Restless leg syndrome, uncontrolled 04/06/2022   Altered mental status, unspecified 04/06/2022   Memory loss of unknown cause 04/06/2022   Abnormal uterine bleeding (AUB) 09/21/2021   History of loop electrical excision procedure (LEEP) 09/21/2021  Vitamin D deficiency 08/26/2019   MVP (mitral valve prolapse) 10/30/2011    Allergies:  Allergies  Allergen Reactions   Sulfa Antibiotics Anaphylaxis   Compazine [Prochlorperazine]     Paradoxical reaction.   Topiramate Other (See Comments)    Suicidal thoughts   Medications:  Current  Outpatient Medications:    brompheniramine-pseudoephedrine-DM 30-2-10 MG/5ML syrup, Take 5 mLs by mouth 4 (four) times daily as needed., Disp: 120 mL, Rfl: 0   ipratropium (ATROVENT) 0.03 % nasal spray, Place 2 sprays into both nostrils every 12 (twelve) hours., Disp: 30 mL, Rfl: 0   albuterol (VENTOLIN HFA) 108 (90 Base) MCG/ACT inhaler, Inhale 2 puffs into the lungs every 6 (six) hours as needed for wheezing or shortness of breath., Disp: 8 g, Rfl: 0   Ascorbic Acid (VITAMIN C) POWD, Take 1,000 mg by mouth daily., Disp: , Rfl:    benzonatate (TESSALON) 100 MG capsule, Take 1 capsule (100 mg total) by mouth 3 (three) times daily as needed for cough., Disp: 30 capsule, Rfl: 0   gabapentin (NEURONTIN) 300 MG capsule, TAKE 2 TO 3 CAPSULES(600 TO 900 MG) BY MOUTH AT BEDTIME, Disp: 270 capsule, Rfl: 1   ketorolac (TORADOL) 10 MG tablet, Take 1 tablet (10 mg total) by mouth every 6 (six) hours as needed (pain)., Disp: 20 tablet, Rfl: 0   lamoTRIgine (LAMICTAL XR) 100 MG 24 hour tablet, Take 1 tablet (100 mg total) by mouth daily., Disp: 30 tablet, Rfl: 11   lamoTRIgine (LAMICTAL XR) 50 MG 24 hour tablet, TAKE 1 TABLET(50 MG) BY MOUTH DAILY, Disp: 30 tablet, Rfl: 5   rOPINIRole (REQUIP) 0.25 MG tablet, Take 1 tablet (0.25 mg total) by mouth in the morning and at bedtime., Disp: 60 tablet, Rfl: 11   traZODone (DESYREL) 50 MG tablet, Take 1 tablet (50 mg total) by mouth at bedtime., Disp: 30 tablet, Rfl: 0  Observations/Objective: Patient is well-developed, well-nourished in no acute distress.  Resting comfortably at home.  Head is normocephalic, atraumatic.  No labored breathing.  Speech is clear and coherent with logical content.  Patient is alert and oriented at baseline.    Assessment and Plan: 1. Viral URI with cough (Primary) - ipratropium (ATROVENT) 0.03 % nasal spray; Place 2 sprays into both nostrils every 12 (twelve) hours.  Dispense: 30 mL; Refill: 0 - brompheniramine-pseudoephedrine-DM  30-2-10 MG/5ML syrup; Take 5 mLs by mouth 4 (four) times daily as needed.  Dispense: 120 mL; Refill: 0  - Suspect viral URI, advised could take covid and/or flu test at home, Mychart message if positive - Added Ipratropium bromide for nasal drainage - Bromfed DM for cough - Symptomatic medications of choice over the counter as needed - Push fluids - Rest - Seek further evaluation if symptoms change or worsen   Follow Up Instructions: I discussed the assessment and treatment plan with the patient. The patient was provided an opportunity to ask questions and all were answered. The patient agreed with the plan and demonstrated an understanding of the instructions.  A copy of instructions were sent to the patient via MyChart unless otherwise noted below.    The patient was advised to call back or seek an in-person evaluation if the symptoms worsen or if the condition fails to improve as anticipated.    Kathryn Loveless, PA-C

## 2023-03-20 NOTE — Patient Instructions (Signed)
Kathryn Brown, thank you for joining [name], PA-C for today's virtual visit.  While this provider is not your primary care provider (PCP), if your PCP is located in our provider database this encounter information will be shared with them immediately following your visit.   A [facility] MyChart account gives you access to today's visit and all your visits, tests, and labs performed at [facility] " click here if you don't have a [name] MyChart account or go to [email or web address]  Consent: (Patient) Kathryn Brown provided verbal consent for this virtual visit at the beginning of the encounter.  Current Medications:  Current Outpatient Medications:    brompheniramine-pseudoephedrine-DM 30-2-10 MG/5ML syrup, Take 5 mLs by mouth 4 (four) times daily as needed., Disp: 120 mL, Rfl: 0   ipratropium (ATROVENT) 0.03 % nasal spray, Place 2 sprays into both nostrils every 12 (twelve) hours., Disp: 30 mL, Rfl: 0   albuterol (VENTOLIN HFA) 108 (90 Base) MCG/ACT inhaler, Inhale 2 puffs into the lungs every 6 (six) hours as needed for wheezing or shortness of breath., Disp: 8 g, Rfl: 0   Ascorbic Acid (VITAMIN C) POWD, Take 1,000 mg by mouth daily., Disp: , Rfl:    benzonatate (TESSALON) 100 MG capsule, Take 1 capsule (100 mg total) by mouth 3 (three) times daily as needed for cough., Disp: 30 capsule, Rfl: 0   gabapentin (NEURONTIN) 300 MG capsule, TAKE 2 TO 3 CAPSULES(600 TO 900 MG) BY MOUTH AT BEDTIME, Disp: 270 capsule, Rfl: 1   ketorolac (TORADOL) 10 MG tablet, Take 1 tablet (10 mg total) by mouth every 6 (six) hours as needed (pain)., Disp: 20 tablet, Rfl: 0   lamoTRIgine (LAMICTAL XR) 100 MG 24 hour tablet, Take 1 tablet (100 mg total) by mouth daily., Disp: 30 tablet, Rfl: 11   lamoTRIgine (LAMICTAL XR) 50 MG 24 hour tablet, TAKE 1 TABLET(50 MG) BY MOUTH DAILY, Disp: 30 tablet, Rfl: 5   rOPINIRole (REQUIP) 0.25 MG tablet, Take 1 tablet (0.25 mg total) by mouth in  the morning and at bedtime., Disp: 60 tablet, Rfl: 11   traZODone (DESYREL) 50 MG tablet, Take 1 tablet (50 mg total) by mouth at bedtime., Disp: 30 tablet, Rfl: 0   Medications ordered in this encounter:  Meds ordered this encounter  Medications   ipratropium (ATROVENT) 0.03 % nasal spray    Sig: Place 2 sprays into both nostrils every 12 (twelve) hours.    Dispense:  30 mL    Refill:  0    Supervising Provider:   Merrilee Jansky [[id number]]   brompheniramine-pseudoephedrine-DM 30-2-10 MG/5ML syrup    Sig: Take 5 mLs by mouth 4 (four) times daily as needed.    Dispense:  120 mL    Refill:  0    Supervising Provider:   Merrilee Jansky [[id number]]     *If you need refills on other medications prior to your next appointment, please contact your pharmacy*  Follow-Up: Call back or seek an in-person evaluation if the symptoms worsen or if the condition fails to improve as anticipated.  [facility] [phone]  Other Instructions Viral Respiratory Infection A respiratory infection is an illness that affects part of the respiratory system, such as the lungs, nose, or throat. A respiratory infection that is caused by a virus is called a viral respiratory infection. Common types of viral respiratory infections include: A cold. The flu (influenza). A respiratory syncytial virus (RSV) infection. What are the causes?  This condition is caused by a virus. The virus may spread through contact with droplets or direct contact with infected people or their mucus or secretions. The virus may spread from person to person (is contagious). What are the signs or symptoms? Symptoms of this condition include: A stuffy or runny nose. A sore throat or cough. Shortness of breath or difficulty breathing. Yellow or green mucus (sputum). Other symptoms may include: A fever. Sweating or chills. Fatigue. Achy muscles. A headache. How is this diagnosed? This condition may be diagnosed  based on: Your symptoms. A physical exam. Testing of secretions from the nose or throat. Chest X-ray. How is this treated? This condition may be treated with medicines, such as: Antiviral medicine. This may shorten the length of time a person has symptoms. Expectorants. These make it easier to cough up mucus. Decongestant nasal sprays. Acetaminophen or NSAIDs, such as ibuprofen, to relieve fever and pain. Antibiotic medicines are not prescribed for viral infections.This is because antibiotics are designed to kill bacteria. They do not kill viruses. Follow these instructions at home: Managing pain and congestion Take over-the-counter and prescription medicines only as told by your health care provider. If you have a sore throat, gargle with a mixture of salt and water 3-4 times a day or as needed. To make salt water, completely dissolve -1 tsp (3-6 g) of salt in 1 cup (237 mL) of warm water. Use nose drops made from salt water to ease congestion and soften raw skin around your nose. Take 2 tsp (10 mL) of honey at bedtime to lessen coughing at night. Do not give honey to children who are younger than 1 year. Drink enough fluid to keep your urine pale yellow. This helps prevent dehydration and helps loosen up mucus. General instructions  Rest as much as possible. Do not drink alcohol. Do not use any products that contain nicotine or tobacco. These products include cigarettes, chewing tobacco, and vaping devices, such as e-cigarettes. If you need help quitting, ask your health care provider. Keep all follow-up visits. This is important. How is this prevented?     Get an annual flu shot. You may get the flu shot in late summer, fall, or winter. Ask your health care provider when you should get your flu shot. Avoid spreading your infection to other people. If you are sick: Wash your hands with soap and water often, especially after you cough or sneeze. Wash for at least 20 seconds. If soap  and water are not available, use alcohol-based hand sanitizer. Cover your mouth when you cough. Cover your nose and mouth when you sneeze. Do not share cups or eating utensils. Clean commonly used objects often. Clean commonly touched surfaces. Stay home from work or school as told by your health care provider. Avoid contact with people who are sick during cold and flu season. This is generally fall and winter. Contact a health care provider if: Your symptoms last for 10 days or longer. Your symptoms get worse over time. You have severe sinus pain in your face or forehead. The glands in your jaw or neck become very swollen. You have shortness of breath. Get help right away if you: Feel pain or pressure in your chest. Have trouble breathing. Faint or feel like you will faint. Have severe and persistent vomiting. Feel confused or disoriented. These symptoms may represent a serious problem that is an emergency. Do not wait to see if the symptoms will go away. Get medical help right away. Call  your local emergency services (911 in the [place] Do not drive yourself to the hospital. Summary A respiratory infection is an illness that affects part of the respiratory system, such as the lungs, nose, or throat. A respiratory infection that is caused by a virus is called a viral respiratory infection. Common types of viral respiratory infections include a cold, influenza, and respiratory syncytial virus (RSV) infection. Symptoms of this condition include a stuffy or runny nose, cough, fatigue, achy muscles, sore throat, and fevers or chills. Antibiotic medicines are not prescribed for viral infections. This is because antibiotics are designed to kill bacteria. They are not effective against viruses. This information is not intended to replace advice given to you by your health care provider. Make sure you discuss any questions you have with your health care provider. Document Revised: 05/21/2020  Document Reviewed: 05/21/2020 Elsevier Patient Education  2024 Elsevier Inc.   If you have been instructed to have an in-person evaluation today at a local Urgent Care facility, please use the link below. It will take you to a list of all of our available [facility], including address, phone number and hours of operation. Please do not delay care.  [facility]  If you or a family member do not have a primary care provider, use the link below to schedule a visit and establish care. When you choose a [facility] primary care physician or advanced practice provider, you gain a long-term partner in health. Find a Primary Care Provider  Learn more about [facility]'s in-office and virtual care options:  - Get Care Now

## 2023-03-29 ENCOUNTER — Other Ambulatory Visit: Payer: Self-pay

## 2023-03-29 ENCOUNTER — Emergency Department (HOSPITAL_BASED_OUTPATIENT_CLINIC_OR_DEPARTMENT_OTHER): Payer: BC Managed Care – PPO | Admitting: Radiology

## 2023-03-29 ENCOUNTER — Emergency Department (HOSPITAL_BASED_OUTPATIENT_CLINIC_OR_DEPARTMENT_OTHER)
Admission: EM | Admit: 2023-03-29 | Discharge: 2023-03-29 | Disposition: A | Discharge status: Home / Self Care | Payer: BC Managed Care – PPO | Attending: Emergency Medicine | Admitting: Emergency Medicine

## 2023-03-29 ENCOUNTER — Encounter (HOSPITAL_BASED_OUTPATIENT_CLINIC_OR_DEPARTMENT_OTHER): Payer: Self-pay | Admitting: Emergency Medicine

## 2023-03-29 ENCOUNTER — Telehealth: Payer: BC Managed Care – PPO | Admitting: Physician Assistant

## 2023-03-29 ENCOUNTER — Ambulatory Visit: Payer: Self-pay | Admitting: Family Medicine

## 2023-03-29 ENCOUNTER — Emergency Department (HOSPITAL_COMMUNITY)
Admission: EM | Admit: 2023-03-29 | Discharge: 2023-03-29 | Discharge status: Against Medical Advice | Payer: BC Managed Care – PPO | Source: Home / Self Care

## 2023-03-29 DIAGNOSIS — R55 Syncope and collapse: Secondary | ICD-10-CM | POA: Insufficient documentation

## 2023-03-29 DIAGNOSIS — J019 Acute sinusitis, unspecified: Secondary | ICD-10-CM | POA: Insufficient documentation

## 2023-03-29 DIAGNOSIS — R079 Chest pain, unspecified: Secondary | ICD-10-CM | POA: Insufficient documentation

## 2023-03-29 DIAGNOSIS — Z20822 Contact with and (suspected) exposure to covid-19: Secondary | ICD-10-CM | POA: Insufficient documentation

## 2023-03-29 DIAGNOSIS — R0989 Other specified symptoms and signs involving the circulatory and respiratory systems: Secondary | ICD-10-CM

## 2023-03-29 DIAGNOSIS — R0602 Shortness of breath: Secondary | ICD-10-CM | POA: Diagnosis not present

## 2023-03-29 DIAGNOSIS — R0981 Nasal congestion: Secondary | ICD-10-CM | POA: Diagnosis not present

## 2023-03-29 LAB — TROPONIN I (HIGH SENSITIVITY): Troponin I (High Sensitivity): <2 ng/L (ref <18)

## 2023-03-29 LAB — RESP PANEL BY RT-PCR (RSV, FLU A&B, COVID)  RVPGX2
Influenza A by PCR: NEGATIVE
Influenza B by PCR: NEGATIVE
Resp Syncytial Virus by PCR: NEGATIVE
SARS Coronavirus 2 by RT PCR: NEGATIVE

## 2023-03-29 LAB — CBC
HCT: 36.3 % (ref 36.0–46.0)
Hemoglobin: 12.1 g/dL (ref 12.0–15.0)
MCH: 29.8 pg (ref 26.0–34.0)
MCHC: 33.3 g/dL (ref 30.0–36.0)
MCV: 89.4 fL (ref 80.0–100.0)
Platelets: 254 10*3/uL (ref 150–400)
RBC: 4.06 MIL/uL (ref 3.87–5.11)
RDW: 14.3 % (ref 11.5–15.5)
WBC: 8.4 10*3/uL (ref 4.0–10.5)
nRBC: 0 % (ref 0.0–0.2)

## 2023-03-29 LAB — BASIC METABOLIC PANEL
Anion gap: 7 (ref 5–15)
BUN: 8 mg/dL (ref 6–20)
CO2: 26 mmol/L (ref 22–32)
Calcium: 9 mg/dL (ref 8.9–10.3)
Chloride: 105 mmol/L (ref 98–111)
Creatinine, Ser: 0.79 mg/dL (ref 0.44–1.00)
GFR, Estimated: >60 mL/min (ref >60)
Glucose, Bld: 87 mg/dL (ref 70–99)
Potassium: 3.9 mmol/L (ref 3.5–5.1)
Sodium: 138 mmol/L (ref 135–145)

## 2023-03-29 MED ORDER — BENZONATATE 100 MG PO CAPS: 100.0000 mg | ORAL_CAPSULE | Freq: Three times a day (TID) | ORAL | 0 refills | Status: DC

## 2023-03-29 MED ORDER — GUAIFENESIN 100 MG/5ML PO LIQD: 100.0000 mg | Freq: Four times a day (QID) | ORAL | 0 refills | Status: DC | PRN

## 2023-03-29 MED ORDER — AMOXICILLIN-POT CLAVULANATE 875-125 MG PO TABS: 1.0000 | ORAL_TABLET | Freq: Two times a day (BID) | ORAL | 0 refills | Status: DC

## 2023-03-29 MED ORDER — ACETAMINOPHEN 500 MG PO TABS: 1000.0000 mg | ORAL_TABLET | Freq: Once | ORAL | Status: DC

## 2023-03-29 NOTE — Telephone Encounter (Signed)
Copied from CRM [id number]. Topic: Clinical - Red Word Triage >> Mar 29, 2023 10:18 AM Almira Coaster wrote: Red Word that prompted transfer to Nurse Triage: Patient is calling with symptoms of fever, cough, congestions, patient had a virtual visit where they stated she may be dehydrated. Patient states earlier this morning she was dizzy and passed out.  Chief Complaint: flu Symptoms: cough, body aches, dizziness, fatigue, fever, difficulty breathing.  Frequency: constant Pertinent Negatives: Patient denies sore throat, resp distress Disposition: [x] ED /[] Urgent Care (no appt availability in office) / [] Appointment(In office/virtual)/ []  [facility]/ [] Home Care/ [] Refused Recommended Disposition /[] [facility]/ []  Follow-up with PCP Additional Notes: patient reports passed out this am and called dr.  Quincy Carnes told her to go to ER for IVF.  Patient instructed to go to ER.  Care advice given, denies questions.  Reason for Disposition  Fever present > 3 days (72 hours)  Answer Assessment - Initial Assessment Questions 1. WORST SYMPTOM: "What is your worst symptom?" (e.g., cough, runny nose, muscle aches, headache, sore throat, fever)      Cough, runny nose, muscle aches, fever, started about 9 days ago. 2. ONSET: "When did your flu symptoms start?"      9 days ago 3. COUGH: "How bad is the cough?"       bad 4. RESPIRATORY DISTRESS: "Describe your breathing."      Yes, wheezing 5. FEVER: "Do you have a fever?" If Yes, ask: "What is your temperature, how was it measured, and when did it start?"     Did not check 6. EXPOSURE: "Were you exposed to someone with influenza?"       Diagnosis with the flu  Protocols used: Influenza (Flu) - Seasonal-A-AH, Common Cold-A-AH

## 2023-03-29 NOTE — Discharge Instructions (Addendum)
Your symptoms are likely due to a viral infection however worsening symptoms may indicate a superimposed bacterial sinusitis.  If you notice no improvement after the next 2 or 3 days you may start on antibiotic prescribed as treatment for sinusitis.  Return if you have any concern.

## 2023-03-29 NOTE — Progress Notes (Signed)
Virtual Visit Consent   Kathryn Brown, you are scheduled for a virtual visit with a [facility] provider today. Just as with appointments in the office, your consent must be obtained to participate. Your consent will be active for this visit and any virtual visit you may have with one of our providers in the next 365 days. If you have a MyChart account, a copy of this consent can be sent to you electronically.  As this is a virtual visit, video technology does not allow for your provider to perform a traditional examination. This may limit your provider's ability to fully assess your condition. If your provider identifies any concerns that need to be evaluated in person or the need to arrange testing (such as labs, EKG, etc.), we will make arrangements to do so. Although advances in technology are sophisticated, we cannot ensure that it will always work on either your end or our end. If the connection with a video visit is poor, the visit may have to be switched to a telephone visit. With either a video or telephone visit, we are not always able to ensure that we have a secure connection.  By engaging in this virtual visit, you consent to the provision of healthcare and authorize for your insurance to be billed (if applicable) for the services provided during this visit. Depending on your insurance coverage, you may receive a charge related to this service.  I need to obtain your verbal consent now. Are you willing to proceed with your visit today? Kathryn Brown has provided verbal consent on 03/29/2023 for a virtual visit (video or telephone). [name], PA-C  Date: 03/29/2023 10:00 AM  Virtual Visit via Video Note   I, [name], connected with  Kathryn Brown  ([id number], Jul 19, 1975) on 03/29/23 at 10:00 AM EST by a video-enabled telemedicine application and verified that I am speaking with the correct person using two identifiers.  Location: Patient: Virtual Visit Location  Patient: Home Provider: Virtual Visit Location Provider: Home Office   I discussed the limitations of evaluation and management by telemedicine and the availability of in person appointments. The patient expressed understanding and agreed to proceed.    History of Present Illness: Kathryn Brown is a 48 y.o. who identifies as a female who was assigned female at birth, and is being seen today for continued URI symptoms. Seen virtually on 03/20/23 and diagnosed with Viral URI. Advised symptomatic care. Symptoms have persisted and worsened. Patient reports syncopal episode this morning.   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   Abnormal dreams 05/26/2022   Myalgia, multiple sites 05/26/2022   Sleep paralysis, recurrent isolated 05/26/2022   Abnormal bruising 05/26/2022   Restless leg syndrome, uncontrolled 04/06/2022   Altered mental status, unspecified 04/06/2022   Memory loss of unknown cause 04/06/2022   Abnormal uterine bleeding (AUB) 09/21/2021   History of loop electrical excision procedure (LEEP) 09/21/2021   Vitamin D deficiency 08/26/2019   MVP (mitral valve prolapse) 10/30/2011    Allergies:  Allergies  Allergen Reactions   Sulfa Antibiotics Anaphylaxis   Compazine [Prochlorperazine]     Paradoxical reaction.   Topiramate Other (See Comments)    Suicidal thoughts   Medications:  Current Outpatient Medications:    albuterol (VENTOLIN HFA) 108 (90 Base) MCG/ACT inhaler, Inhale 2 puffs into the lungs every 6 (six) hours as needed for wheezing or shortness of breath., Disp: 8 g, Rfl:  0   Ascorbic Acid (VITAMIN C) POWD, Take 1,000 mg by mouth daily., Disp: , Rfl:    benzonatate (TESSALON) 100 MG capsule, Take 1 capsule (100 mg total) by mouth 3 (three) times daily as needed for cough., Disp: 30 capsule, Rfl: 0   brompheniramine-pseudoephedrine-DM 30-2-10 MG/5ML syrup, Take 5 mLs by mouth 4 (four) times daily as needed., Disp: 120  mL, Rfl: 0   gabapentin (NEURONTIN) 300 MG capsule, TAKE 2 TO 3 CAPSULES(600 TO 900 MG) BY MOUTH AT BEDTIME, Disp: 270 capsule, Rfl: 1   ipratropium (ATROVENT) 0.03 % nasal spray, Place 2 sprays into both nostrils every 12 (twelve) hours., Disp: 30 mL, Rfl: 0   ketorolac (TORADOL) 10 MG tablet, Take 1 tablet (10 mg total) by mouth every 6 (six) hours as needed (pain)., Disp: 20 tablet, Rfl: 0   lamoTRIgine (LAMICTAL XR) 100 MG 24 hour tablet, Take 1 tablet (100 mg total) by mouth daily., Disp: 30 tablet, Rfl: 11   lamoTRIgine (LAMICTAL XR) 50 MG 24 hour tablet, TAKE 1 TABLET(50 MG) BY MOUTH DAILY, Disp: 30 tablet, Rfl: 5   rOPINIRole (REQUIP) 0.25 MG tablet, Take 1 tablet (0.25 mg total) by mouth in the morning and at bedtime., Disp: 60 tablet, Rfl: 11   traZODone (DESYREL) 50 MG tablet, Take 1 tablet (50 mg total) by mouth at bedtime., Disp: 30 tablet, Rfl: 0  Observations/Objective: Patient is well-developed, well-nourished in no acute distress.  Resting comfortably at home.  Head is normocephalic, atraumatic.  No labored breathing.  Speech is clear and coherent with logical content.  Patient is alert and oriented at baseline.    Assessment and Plan: There are no diagnoses linked to this encounter. - Since patient had syncopal episode in setting of URI symptoms over 10 days advised to seek in person evaluation at closest ER facility. Suspect dehydration and possible pneumonia.  Follow Up Instructions: I discussed the assessment and treatment plan with the patient. The patient was provided an opportunity to ask questions and all were answered. The patient agreed with the plan and demonstrated an understanding of the instructions.  A copy of instructions were sent to the patient via MyChart unless otherwise noted below.    The patient was advised to call back or seek an in-person evaluation if the symptoms worsen or if the condition fails to improve as anticipated.    [name], PA-C

## 2023-03-29 NOTE — ED Triage Notes (Signed)
Pt via pov from home with cough, sob, nasal congestion, "lung pain", near syncope/dizziness. Pt alert & oriented, nad noted.

## 2023-03-29 NOTE — ED Notes (Signed)

## 2023-03-29 NOTE — ED Provider Notes (Signed)
[place] EMERGENCY DEPARTMENT AT [facility] Provider Note   CSN: [id number] Arrival date & time: 03/29/23  1319     History  Chief Complaint  Patient presents with   Nasal Congestion   Chest Pain    Kathryn Brown is a 48 y.o. female.  The history is provided by the patient and medical records. No language interpreter was used.  Chest Pain    48 year old female history of heart palpitation, mitral valve prolapse, seizure, presenting with flulike symptoms.  Patient reports 10 days ago she developed flulike symptoms which includes sinus congestion, sneezing, coughing, subjective fever, chills, body aches, decrease in appetite.  She had a virtual visit and was diagnosed with rhino virus and was prescribed a nasal spray and some medication.  As she mentioned her symptoms still persist, and today while she was in the bathroom to get ready to work she felt lightheadedness and subsequently had a syncopal episode.  She was able to get herself down to the ground she denies hitting her head or having significant injury from her fall but she voiced concerns about her symptoms.  She does endorse some pleuritic chest pain.  No nausea vomiting diarrhea.  No prior history of PE or DVT no recent surgery prolonged bedrest active cancer hemoptysis taking oral birth control pills or oral hormone.  Denies any calf tenderness leg swelling.  She denies alcohol or tobacco use she also denies history of asthma or COPD.  Home Medications Prior to Admission medications   Medication Sig Start Date End Date Taking? Authorizing Provider  albuterol (VENTOLIN HFA) 108 (90 Base) MCG/ACT inhaler Inhale 2 puffs into the lungs every 6 (six) hours as needed for wheezing or shortness of breath. 12/28/22   Waldon Merl, PA-C  Ascorbic Acid (VITAMIN C) POWD Take 1,000 mg by mouth daily.    [provider]  benzonatate (TESSALON) 100 MG capsule Take 1 capsule (100 mg total) by mouth 3 (three) times  daily as needed for cough. 12/28/22   Waldon Merl, PA-C  brompheniramine-pseudoephedrine-DM 30-2-10 MG/5ML syrup Take 5 mLs by mouth 4 (four) times daily as needed. 03/20/23   [name], PA-C  gabapentin (NEURONTIN) 300 MG capsule TAKE 2 TO 3 CAPSULES(600 TO 900 MG) BY MOUTH AT BEDTIME 02/09/23   Naomie Dean B, MD  ipratropium (ATROVENT) 0.03 % nasal spray Place 2 sprays into both nostrils every 12 (twelve) hours. 03/20/23   [name], PA-C  ketorolac (TORADOL) 10 MG tablet Take 1 tablet (10 mg total) by mouth every 6 (six) hours as needed (pain). 09/30/22   Zenia Resides, MD  lamoTRIgine (LAMICTAL XR) 100 MG 24 hour tablet Take 1 tablet (100 mg total) by mouth daily. 05/23/22 05/18/23  Windell Norfolk, MD  lamoTRIgine (LAMICTAL XR) 50 MG 24 hour tablet TAKE 1 TABLET(50 MG) BY MOUTH DAILY 10/28/22   Windell Norfolk, MD  rOPINIRole (REQUIP) 0.25 MG tablet Take 1 tablet (0.25 mg total) by mouth in the morning and at bedtime. 03/16/23   Windell Norfolk, MD  traZODone (DESYREL) 50 MG tablet Take 1 tablet (50 mg total) by mouth at bedtime. 03/16/23   Windell Norfolk, MD      Allergies    Sulfa antibiotics, Compazine [prochlorperazine], and Topiramate    Review of Systems   Review of Systems  Cardiovascular:  Positive for chest pain.  All other systems reviewed and are negative.   Physical Exam Updated Vital Signs BP 104/73 (BP Location: Right Arm)  Pulse 81   Temp 98.2 F (36.8 C)   Resp 16   Ht 5' 8.5" (1.74 m)   Wt 77.1 kg   LMP 03/29/2023 (Exact Date)   SpO2 100%   BMI 25.47 kg/m  Physical Exam Vitals and nursing note reviewed.  Constitutional:      General: She is not in acute distress.    Appearance: She is well-developed.  HENT:     Head: Atraumatic.  Eyes:     Conjunctiva/sclera: Conjunctivae normal.  Cardiovascular:     Rate and Rhythm: Normal rate and regular rhythm.     Pulses: Normal pulses.     Heart sounds: Normal heart sounds.     No  friction rub.  Pulmonary:     Effort: Pulmonary effort is normal.     Breath sounds: No wheezing, rhonchi or rales.  Abdominal:     Palpations: Abdomen is soft.     Tenderness: There is no abdominal tenderness.  Musculoskeletal:     Cervical back: Neck supple.     Right lower leg: No edema.     Left lower leg: No edema.  Skin:    General: Skin is warm.     Findings: No rash.  Neurological:     Mental Status: She is alert. Mental status is at baseline.  Psychiatric:        Mood and Affect: Mood normal.     ED Results / Procedures / Treatments   Labs (all labs ordered are listed, but only abnormal results are displayed) Labs Reviewed  RESP PANEL BY RT-PCR (RSV, FLU A&B, COVID)  [id number]  BASIC METABOLIC PANEL  CBC  PREGNANCY, URINE  TROPONIN I (HIGH SENSITIVITY)  TROPONIN I (HIGH SENSITIVITY)    EKG EKG Interpretation Date/Time:  Wednesday March 29 2023 13:34:41 EST Ventricular Rate:  82 PR Interval:  148 QRS Duration:  82 QT Interval:  376 QTC Calculation: 439 R Axis:   39  Text Interpretation: Normal sinus rhythm Cannot rule out Anterior infarct , age undetermined Abnormal ECG When compared with ECG of 30-Mar-2022 12:33, PREVIOUS ECG IS PRESENT Confirmed by Ernie Avena ([id number]) on 03/29/2023 2:12:16 PM  Radiology DG Chest 2 View Result Date: 03/29/2023 CLINICAL DATA:  Chest pain.  Shortness of breath. EXAM: CHEST - 2 VIEW COMPARISON:  03/09/2021 FINDINGS: The cardiomediastinal contours are normal. The lungs are clear. Pulmonary vasculature is normal. No consolidation, pleural effusion, or pneumothorax. No acute osseous abnormalities are seen. IMPRESSION: No active cardiopulmonary disease. Electronically Signed   By: Narda Rutherford M.D.   On: 03/29/2023 15:16    Procedures Procedures    Medications Ordered in ED Medications  acetaminophen (TYLENOL) tablet 1,000 mg (has no administration in time range)    ED Course/ Medical Decision Making/ A&P                                  Medical Decision Making Amount and/or Complexity of Data Reviewed Labs: ordered. Radiology: ordered.  Risk OTC drugs.   BP 104/73 (BP Location: Right Arm)   Pulse 81   Temp 98.2 F (36.8 C)   Resp 16   Ht 5' 8.5" (1.74 m)   Wt 77.1 kg   LMP 03/29/2023 (Exact Date)   SpO2 100%   BMI 25.47 kg/m   11:41 PM  48 year old female history of heart palpitation, mitral valve prolapse, seizure, presenting with flulike symptoms.  Patient reports 10 days ago  she developed flulike symptoms which includes sinus congestion, sneezing, coughing, subjective fever, chills, body aches, decrease in appetite.  She had a virtual visit and was diagnosed with rhino virus and was prescribed a nasal spray and some medication.  As she mentioned her symptoms still persist, and today while she was in the bathroom to get ready to work she felt lightheadedness and subsequently had a syncopal episode.  She was able to get herself down to the ground she denies hitting her head or having significant injury from her fall but she voiced concerns about her symptoms.  She does endorse some pleuritic chest pain.  No nausea vomiting diarrhea.  No prior history of PE or DVT no recent surgery prolonged bedrest active cancer hemoptysis taking oral birth control pills or oral hormone.  Denies any calf tenderness leg swelling.  She denies alcohol or tobacco use she also denies history of asthma or COPD.  On exam, patient does cough on occasion while in the room but overall nontoxic in appearance.  Heart with normal rate rhythm, lungs clear to auscultation bilaterally abdomen is soft nontender no peripheral edema noted.  Vital signs are reassuring.  Patient is PERC negative, low suspicion for PE.  [place] Syncope Rule is zero.  EMR reviewed, patient last had an echocardiogram done in 2021 at that time no evidence of mitral valve prolapse.  -Labs ordered, independently viewed and interpreted by me.  Labs  remarkable for negative COVID flu RSV.  Electrolyte panels are reassuring.  Normal WBC, normal H&H -The patient was maintained on a cardiac monitor.  I personally viewed and interpreted the cardiac monitored which showed an underlying rhythm of: Normal sinus rhythm -Imaging independently viewed and interpreted by me and I agree with radiologist's interpretation.  Result remarkable for chest x-ray without any active disease -This patient presents to the ED for concern of flulike symptoms, this involves an extensive number of treatment options, and is a complaint that carries with it a high risk of complications and morbidity.  The differential diagnosis includes COVID, flu, RSV, viral illness, pneumonia, anemia, electrolyte imbalance -Co morbidities that complicate the patient evaluation includes heart palpitation, seizure -Treatment includes reassurance -Reevaluation of the patient after these medicines showed that the patient improved -PCP office notes or outside notes reviewed -Escalation to admission/observation considered: patients feels much better, is comfortable with discharge, and will follow up with PCP -Prescription medication considered, patient comfortable with tessalon, guaifenesin, and augmentin -Social Determinant of Health considered which includes depression  3:43 PM Workup overall reassuring.  Patient however requesting for antibiotic treatments as the symptom has been ongoing for the past 10 days.  She mention his symptoms seem to be improving but then it got worse.  She does have some sinus discomfort.  She potentially may have superimposed bacterial sinusitis causing worsening of her symptoms.  After further discussion, I have agreed to prescribe Augmentin but recommend watchful waiting and if after several days and if she notice no improvement then to take antibiotic as prescribed.  Patient voiced standing and agrees with plan.  Return precaution given.         Final  Clinical Impression(s) / ED Diagnoses Final diagnoses:  Acute sinusitis, recurrence not specified, unspecified location  Vasovagal syncope    Rx / DC Orders ED Discharge Orders          Ordered    benzonatate (TESSALON) 100 MG capsule  Every 8 hours        03/29/23 1542  guaiFENesin (ROBITUSSIN) 100 MG/5ML liquid  Every 6 hours PRN        03/29/23 1542    amoxicillin-clavulanate (AUGMENTIN) 875-125 MG tablet  Every 12 hours        03/29/23 1542              Fayrene Helper, PA-C 03/29/23 1545    Ernie Avena, MD 03/29/23 1659

## 2023-03-29 NOTE — Patient Instructions (Signed)
  Kathryn Brown, thank you for joining [name], PA-C for today's virtual visit.  While this provider is not your primary care provider (PCP), if your PCP is located in our provider database this encounter information will be shared with them immediately following your visit.   A [facility] MyChart account gives you access to today's visit and all your visits, tests, and labs performed at [facility] " click here if you don't have a [facility] MyChart account or go to [email or web address]  Consent: (Patient) Kathryn Brown provided verbal consent for this virtual visit at the beginning of the encounter.  Current Medications:  Current Outpatient Medications:    albuterol (VENTOLIN HFA) 108 (90 Base) MCG/ACT inhaler, Inhale 2 puffs into the lungs every 6 (six) hours as needed for wheezing or shortness of breath., Disp: 8 g, Rfl: 0   Ascorbic Acid (VITAMIN C) POWD, Take 1,000 mg by mouth daily., Disp: , Rfl:    benzonatate (TESSALON) 100 MG capsule, Take 1 capsule (100 mg total) by mouth 3 (three) times daily as needed for cough., Disp: 30 capsule, Rfl: 0   brompheniramine-pseudoephedrine-DM 30-2-10 MG/5ML syrup, Take 5 mLs by mouth 4 (four) times daily as needed., Disp: 120 mL, Rfl: 0   gabapentin (NEURONTIN) 300 MG capsule, TAKE 2 TO 3 CAPSULES(600 TO 900 MG) BY MOUTH AT BEDTIME, Disp: 270 capsule, Rfl: 1   ipratropium (ATROVENT) 0.03 % nasal spray, Place 2 sprays into both nostrils every 12 (twelve) hours., Disp: 30 mL, Rfl: 0   ketorolac (TORADOL) 10 MG tablet, Take 1 tablet (10 mg total) by mouth every 6 (six) hours as needed (pain)., Disp: 20 tablet, Rfl: 0   lamoTRIgine (LAMICTAL XR) 100 MG 24 hour tablet, Take 1 tablet (100 mg total) by mouth daily., Disp: 30 tablet, Rfl: 11   lamoTRIgine (LAMICTAL XR) 50 MG 24 hour tablet, TAKE 1 TABLET(50 MG) BY MOUTH DAILY, Disp: 30 tablet, Rfl: 5   rOPINIRole (REQUIP) 0.25 MG tablet, Take 1 tablet (0.25 mg total) by mouth in  the morning and at bedtime., Disp: 60 tablet, Rfl: 11   traZODone (DESYREL) 50 MG tablet, Take 1 tablet (50 mg total) by mouth at bedtime., Disp: 30 tablet, Rfl: 0   Medications ordered in this encounter:  No orders of the defined types were placed in this encounter.    *If you need refills on other medications prior to your next appointment, please contact your pharmacy*  Follow-Up: Call back or seek an in-person evaluation if the symptoms worsen or if the condition fails to improve as anticipated.  [facility] [phone]  Other Instructions Seek care at closest ER facility   If you have been instructed to have an in-person evaluation today at a local Urgent Care facility, please use the link below. It will take you to a list of all of our available [place] [facility], including address, phone number and hours of operation. Please do not delay care.  [facility]  If you or a family member do not have a primary care provider, use the link below to schedule a visit and establish care. When you choose a [facility] primary care physician or advanced practice provider, you gain a long-term partner in health. Find a Primary Care Provider  Learn more about [facility]'s in-office and virtual care options: [place] [facility] [place] - Get Care Now

## 2023-04-06 ENCOUNTER — Ambulatory Visit: Payer: BC Managed Care – PPO | Admitting: Neurology

## 2023-04-24 ENCOUNTER — Other Ambulatory Visit: Payer: Self-pay | Admitting: Neurology

## 2023-04-25 NOTE — Telephone Encounter (Signed)
 Rx refilled.

## 2023-05-15 ENCOUNTER — Other Ambulatory Visit: Payer: Self-pay | Admitting: Neurology

## 2023-06-07 ENCOUNTER — Telehealth: Payer: Self-pay | Admitting: Neurology

## 2023-06-07 ENCOUNTER — Other Ambulatory Visit: Payer: Self-pay | Admitting: Neurology

## 2023-06-07 MED ORDER — TRAZODONE HCL 50 MG PO TABS: 50.0000 mg | ORAL_TABLET | Freq: Every day | ORAL | 0 refills | Status: DC

## 2023-06-07 NOTE — Telephone Encounter (Signed)
 Pt is requesting a refill for  traZODone (DESYREL) 50 MG tablet.  Pharmacy: [facility] [phone]

## 2023-06-07 NOTE — Telephone Encounter (Signed)
 Requested Prescriptions   Pending Prescriptions Disp Refills   traZODone (DESYREL) 50 MG tablet 30 tablet 0    Sig: Take 1 tablet (50 mg total) by mouth at bedtime.   Last seen 03/16/23 Next appt 09/19/23 Refill sent

## 2023-06-13 ENCOUNTER — Encounter: Payer: Self-pay | Admitting: Family

## 2023-06-13 ENCOUNTER — Ambulatory Visit: Admitting: Family

## 2023-06-13 VITALS — BP 128/83 | HR 76 | Temp 98.2°F | Ht 68.5 in | Wt 185.0 lb

## 2023-06-13 DIAGNOSIS — G479 Sleep disorder, unspecified: Secondary | ICD-10-CM

## 2023-06-13 DIAGNOSIS — R030 Elevated blood-pressure reading, without diagnosis of hypertension: Secondary | ICD-10-CM | POA: Diagnosis not present

## 2023-06-13 DIAGNOSIS — R790 Abnormal level of blood mineral: Secondary | ICD-10-CM | POA: Diagnosis not present

## 2023-06-13 DIAGNOSIS — R5383 Other fatigue: Secondary | ICD-10-CM | POA: Diagnosis not present

## 2023-06-13 DIAGNOSIS — G4753 Recurrent isolated sleep paralysis: Secondary | ICD-10-CM

## 2023-06-13 NOTE — Progress Notes (Signed)
 Patient ID: Kathryn Brown, female    DOB: Jul 20, 1975, 48 y.o.   MRN: [id number]  Chief Complaint  Patient presents with   Transfer of care   Spasms    Pt c/o muscle spasms, Pt is unable to sleep at night, unable to focus and sleep walking, more often for several months. Neurology dx with RLS.   Discussed the use of AI scribe software for clinical note transcription with the patient, who gave verbal consent to proceed.  History of Present Illness Kathryn Brown, a patient with a history of neurological symptoms, presents with worsening convulsions, particularly at night, disrupting her sleep. These convulsions are described as full-body shaking episodes that last about 15 minutes, followed by a period of calming down. The convulsions have been occurring almost every night for the past two months, leading to significant sleep deprivation. Kathryn Brown reports that these convulsions are now starting to occur during the day as well, affecting her work and daily activities. She also reports having brain fog, memory issues, taking pauses in conversation, headaches, dizziness, and word finding difficulty. Kathryn Brown has been on gabapentin  and Lamictal , prescribed by her neurologist, but these medications have not been effective in managing her symptoms. She has been gradually reducing her gabapentin  dosage due to side effects, including a constant feeling of being hungover, dizziness, and vision changes. Kathryn Brown also reports experiencing unusual headaches, described as feeling like an ice pick inside her brain, followed by a dull sensation.  In addition to her neurological symptoms, Kathryn Brown's blood pressure was found to be unusually high during the visit. She has also noticed recent weight gain, despite not changing her eating habits.   Assessment & Plan Nocturnal Seizures/RLS  - Experiences nightly and now daytime convulsions. Previous testing all negative including ambulatory EEG, 2 head MRIs. The sleep disorder neurologist  suspects nocturnal seizures, but needs sleep study to confirm. Sensitive to gabapentin  with side effects, has started weaning off. In office sleep study suggested but not covered by insurance. Per review of EMR, a home study was to be covered, but no record of follow thru. Pt reports only a few hours of sleep nightly, affecting her mental health and ability to function at her job. Also, per review of past Neuro notes, pt has had periods of severe stress related to her work which could be contributing to symptoms. Also, per review, hx of low ferritin never treated, which can contribute to RLS. - Refer back to Dr. Raoul Byes Dohmeier for further evaluation (pt has been unable to get a call back regarding scheduling an appointment) and potential home sleep study.  - Discuss sleep medication with Dr. Albertina Hugger if sleep study is negative. - Sending ferrous sulfate, slow release, 1 pill qd, recheck levels in 2 months. - Continue tapering gabapentin  and taking Ropinirole  as per neurologist's advice.  Elevated BP reading - Unusually high blood pressure at beginning of visit, wnl when rechecked at end of visit. May be having fluctuations at home contributing to headaches, dizziness. Advised weight gain could possibly be elevating BP at times and/or sleep deprivation.  - Encourage exercise, low sodium diet, weight loss, increased water intake as measures to help manage blood pressure. - Monitor blood pressure at home, checking once in the morning and once in the evening on different days, and notify office of consistently high readings >140/90, either number.  General Health Maintenance - Made lifestyle changes including reduced caffeine and increased water intake. Reports weight gain of 15lbs in last 3 mos despite these  changes. - Encourage continued healthy eating habits, not skipping meals. - Consider magnesium supplementation for overall health benefits. - Schedule a physical with fasting labs when  convenient.   Subjective:    Outpatient Medications Prior to Visit  Medication Sig Dispense Refill   Ascorbic Acid (VITAMIN C) POWD Take 1,000 mg by mouth daily.     gabapentin  (NEURONTIN ) 300 MG capsule TAKE 2 TO 3 CAPSULES(600 TO 900 MG) BY MOUTH AT BEDTIME 270 capsule 1   rOPINIRole  (REQUIP ) 0.25 MG tablet Take 1 tablet (0.25 mg total) by mouth in the morning and at bedtime. 60 tablet 11   traZODone  (DESYREL ) 50 MG tablet Take 1 tablet (50 mg total) by mouth at bedtime. 30 tablet 0   lamoTRIgine  (LAMICTAL  XR) 50 MG 24 hour tablet TAKE 1 TABLET(50 MG) BY MOUTH DAILY 30 tablet 5   albuterol  (VENTOLIN  HFA) 108 (90 Base) MCG/ACT inhaler Inhale 2 puffs into the lungs every 6 (six) hours as needed for wheezing or shortness of breath. 8 g 0   amoxicillin -clavulanate (AUGMENTIN ) 875-125 MG tablet Take 1 tablet by mouth every 12 (twelve) hours. 14 tablet 0   benzonatate  (TESSALON ) 100 MG capsule Take 1 capsule (100 mg total) by mouth every 8 (eight) hours. 21 capsule 0   brompheniramine-pseudoephedrine-DM 30-2-10 MG/5ML syrup Take 5 mLs by mouth 4 (four) times daily as needed. 120 mL 0   guaiFENesin  (ROBITUSSIN) 100 MG/5ML liquid Take 5-10 mLs (100-200 mg total) by mouth every 6 (six) hours as needed for cough or to loosen phlegm. 150 mL 0   ipratropium (ATROVENT ) 0.03 % nasal spray Place 2 sprays into both nostrils every 12 (twelve) hours. 30 mL 0   ketorolac  (TORADOL ) 10 MG tablet Take 1 tablet (10 mg total) by mouth every 6 (six) hours as needed (pain). 20 tablet 0   lamoTRIgine  (LAMICTAL  XR) 100 MG 24 hour tablet Take 1 tablet (100 mg total) by mouth daily. 30 tablet 11   No facility-administered medications prior to visit.   Past Medical History:  Diagnosis Date   Chest pain    Heart murmur    Heart palpitations    History of stomach ulcers    as a child   MVP (mitral valve prolapse) 10/30/2011   Seizure (HCC)    Past Surgical History:  Procedure Laterality Date   TONSILECTOMY,  ADENOIDECTOMY, BILATERAL MYRINGOTOMY AND TUBES     TONSILLECTOMY     Allergies  Allergen Reactions   Sulfa Antibiotics Anaphylaxis   Compazine  [Prochlorperazine ]     Paradoxical reaction.   Topiramate  Other (See Comments)    Suicidal thoughts      Objective:    Physical Exam Vitals and nursing note reviewed.  Constitutional:      Appearance: Normal appearance.  Cardiovascular:     Rate and Rhythm: Normal rate and regular rhythm.  Pulmonary:     Effort: Pulmonary effort is normal.     Breath sounds: Normal breath sounds.  Musculoskeletal:        General: Normal range of motion.  Skin:    General: Skin is warm and dry.  Neurological:     Mental Status: She is alert.  Psychiatric:        Mood and Affect: Mood normal.        Behavior: Behavior normal.    BP 128/83   Pulse 76   Temp 98.2 F (36.8 C) (Temporal)   Ht 5' 8.5" (1.74 m)   Wt 185 lb (83.9 kg)  SpO2 100%   BMI 27.72 kg/m  Wt Readings from Last 3 Encounters:  06/13/23 185 lb (83.9 kg)  03/29/23 169 lb 15.6 oz (77.1 kg)  09/30/22 170 lb (77.1 kg)      Versa Gore, NP

## 2023-06-13 NOTE — Patient Instructions (Addendum)
 Welcome to [facility], It was a pleasure meeting you today!   I will send a referral to the neurology office, specifically, Dr. Albertina Hugger. Let me know if they do not call or you are unable to get an appointment.  Continue to monitor your blood pressure at home. Especially if having symptoms of headache, dizziness, or just feeling bad - call the office for consistent readings > 140/90 either number.  Magnesium glycinate, L-threonate, or taurate can help with anxiety, maintaining sleep (if taken at night), muscle recovery, good bowel function, hot flashes, and maintaining blood pressure.  Look for chelated form (better absorbability) and for any over the counter supplement or vitamin, look for organic or has a 3rd party seal from NSF international, [employer] or US[employer].  This authenticates the quality but not the efficacy (since not FDA approved).     PLEASE NOTE: If you had any LAB tests please let us  know if you have not heard back within a few days. You may see your results on MyChart before we have a chance to review them but we will give you a call once they are reviewed by us . If we ordered any REFERRALS today, please let us  know if you have not heard from their office within the next week.  Let us  know through MyChart if you are needing REFILLS, or have your pharmacy send us  the request. You can also use MyChart to communicate with me or any office staff.  Please try these tips to maintain a healthy lifestyle: It is important that you exercise regularly at least 30 minutes 5 times a week. Think about what you will eat, plan ahead. Choose whole foods, & think  "clean, green, fresh or frozen" over canned, processed or packaged foods which are more sugary, salty, and fatty. 70 to 75% of food eaten should be fresh vegetables and protein. 2-3  meals daily with healthy snacks between meals, but must be whole fruit, protein or vegetables. Aim to eat over a 10 hour  period when you are active, for example, 7am to 5pm, and then STOP after your last meal of the day, drinking only water.  Shorter eating windows, 6-8 hours, are showing benefits in heart disease and blood sugar regulation. Drink water every day! Shoot for 64 ounces daily = 8 cups, no other drink is as healthy! Fruit juice is best enjoyed in a healthy way, by EATING the fruit.

## 2023-06-14 ENCOUNTER — Telehealth: Payer: Self-pay

## 2023-06-14 NOTE — Telephone Encounter (Signed)
I called pt and lvm in regards.

## 2023-06-14 NOTE — Telephone Encounter (Signed)
-----   Message from Versa Gore sent at 06/13/2023  8:26 PM EDT ----- Regarding: Visit f/u questions Please call Eilis and let her know I reviewed her chart notes and it was recommended to have a home sleep study completed when the in-office one was not covered, was this ever done?  Did her husband ever video tape her convulsions during her sleep?  Also it was recommended she start an iron supplement due to having low ferritin (which can be very beneficial for restless leg syndrome), did she ever start this? Let me know what she says, thanks.

## 2023-06-14 NOTE — Telephone Encounter (Signed)
 Copied from CRM [id number]. Topic: General - Other >> Jun 14, 2023  1:37 PM Adonis Hoot wrote: Reason for CRM: Patient returned call to [facility] stated that they never did one at home because it was not explained to her in detail as to what they were suppose to do. She stated that she never started the iron supplement either. She stated that nothing was ever prescribed to her.  She stated that she never read chart notes so she wasn't aware of ant of these things.

## 2023-06-16 MED ORDER — SLOW RELEASE IRON 45 MG PO TBCR
1.0000 | EXTENDED_RELEASE_TABLET | Freq: Every day | ORAL | 1 refills | Status: AC
Start: 2023-06-16 — End: ?

## 2023-06-16 NOTE — Telephone Encounter (Signed)
 Let Kathryn Brown know I did send in a slow release iron  supplement 45mg  to start taking daily, as this can be very beneficial in preventing Restless leg syndrome, if the pharmacy does not give to her, then she can find it over the counter. It may cause dark or black stool (normal) & nausea but can take after eating. I have sent the referral to Dr Dohmeier specifically for sleep disorder & to f/u on restarting order for sleep study or home sleep study. Thx

## 2023-06-18 NOTE — Progress Notes (Unsigned)
 Cardiology Office Note:  .   Date:  06/20/2023  ID:  Kathryn Brown, MRN [id number] PCP: Versa Gore, NP  [facility] Providers Cardiologist:  None { History of Present Illness: .    Chief Complaint  Patient presents with   Follow-up    Kathryn Brown is a 48 y.o. female with history of PFO who presents for follow-up.    History of Present Illness   Kathryn Brown is a 48 year old female who presents for follow-up of PFO.   She experiences sleep disturbances characterized by episodes of violent convulsing during the night, leading to bruises on her legs and biting the inside of her cheeks. These episodes occur four to five times a week and have persisted despite attempts to modify caffeine intake. She has been referred for a sleep study and has undergone EEG and MRI, both of which were unremarkable. Neurology is involved in her care. She finds the sleep disturbances particularly distressing.  She reports a high level of stress but feels conditioned to it, likening it to an athlete's conditioning. 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 She has significantly reduced her caffeine intake, consuming only a small amount of coffee in the morning. No recent chest pain or trouble breathing during the visit.          Problem List PFO Normal coronary arteries     ROS: All other ROS reviewed and negative. Pertinent positives noted in the HPI.     Studies Reviewed: Aaron Aas       TTE 05/12/2022  1. Left ventricular ejection fraction, by estimation, is 55 to 60%. Left  ventricular ejection fraction by PLAX is 57 %. The left ventricle has  normal function. The left ventricle has no regional wall motion  abnormalities. Left ventricular diastolic  parameters were normal.   2. Right ventricular systolic function is normal. The right ventricular  size is normal.   3. Saline microcavitation study was negative at rest and with Valsalva.  Therewas no clear evidence of  interatrial shunting. Agitated saline  contrast bubble study was negative, with no evidence of any interatrial  shunt.   4. The mitral valve is normal in structure. Trivial mitral valve  regurgitation. No evidence of mitral stenosis.   5. The aortic valve is tricuspid. Aortic valve regurgitation is not  visualized. No aortic stenosis is present.   6. The inferior vena cava is normal in size with greater than 50%  respiratory variability, suggesting right atrial pressure of 3 mmHg.  Physical Exam:   VS:  BP 104/68   Pulse 73   Ht 5' 8.5" (1.74 m)   Wt 184 lb 9.6 oz (83.7 kg)   SpO2 99%   BMI 27.66 kg/m    Wt Readings from Last 3 Encounters:  06/20/23 184 lb 9.6 oz (83.7 kg)  06/13/23 185 lb (83.9 kg)  03/29/23 169 lb 15.6 oz (77.1 kg)    GEN: Well nourished, well developed in no acute distress NECK: No JVD; No carotid bruits CARDIAC: RRR, no murmurs, rubs, gallops RESPIRATORY:  Clear to auscultation without rales, wheezing or rhonchi  ABDOMEN: Soft, non-tender, non-distended EXTREMITIES:  No edema; No deformity  ASSESSMENT AND PLAN: .   Assessment and Plan    Patent foramen ovale (PFO) PFO considered insignificant. Recent echocardiogram and EKG show no issues. Well-managed cardiac status. - No further cardiac testing required. - Advise follow-up as needed.  Follow-up: Return if symptoms worsen or fail to improve.   Signed, Gigi Kyle. Rolm Clos, MD, [facility]  [facility]  [address], [address] [place], [place] [address] [phone]  2:21 PM

## 2023-06-20 ENCOUNTER — Encounter (HOSPITAL_BASED_OUTPATIENT_CLINIC_OR_DEPARTMENT_OTHER): Payer: Self-pay | Admitting: Cardiovascular Disease

## 2023-06-20 ENCOUNTER — Ambulatory Visit (HOSPITAL_BASED_OUTPATIENT_CLINIC_OR_DEPARTMENT_OTHER): Admitting: Cardiovascular Disease

## 2023-06-20 VITALS — BP 104/68 | HR 73 | Ht 68.5 in | Wt 184.6 lb

## 2023-06-20 DIAGNOSIS — Q2112 Patent foramen ovale: Secondary | ICD-10-CM

## 2023-06-20 NOTE — Patient Instructions (Signed)
 Medication Instructions:  Your physician recommends that you continue on your current medications as directed. Please refer to the Current Medication list given to you today.  *If you need a refill on your cardiac medications before your next appointment, please call your pharmacy*  Follow-Up: At [facility], you and your health needs are our priority.  As part of our continuing mission to provide you with exceptional heart care, we have created designated Provider Care Teams.  These Care Teams include your primary Cardiologist (physician) and Advanced Practice Providers (APPs -  Physician Assistants and Nurse Practitioners) who all work together to provide you with the care you need, when you need it.  We recommend signing up for the patient portal called "MyChart".  Sign up information is provided on this After Visit Summary.  MyChart is used to connect with patients for Virtual Visits (Telemedicine).  Patients are able to view lab/test results, encounter notes, upcoming appointments, etc.  Non-urgent messages can be sent to your provider as well.   To learn more about what you can do with MyChart, go to [email or web address].    Your next appointment:   As needed  Provider:   Dr. Rolm Clos or any APP

## 2023-06-26 ENCOUNTER — Telehealth: Payer: Self-pay

## 2023-06-26 ENCOUNTER — Encounter: Payer: Self-pay | Admitting: Neurology

## 2023-06-26 NOTE — Telephone Encounter (Signed)
 Call to husband and patient, he reports that "last night she  woke up having convulsions in her arms and legs and feeling very nauseous. Once she was awake, she started losing control of breathing. She felt as if she was choking while not being able to control her limbs. She ended up in the floor of the bathroom without knowing how she ended up there. Once the convulsions backed off, she was able to breathe through her nose but had to force herself to cough so she could start breathing through her mouth again. The entire episode lasted for about 30 minutes. She did urinate on herself. She is not sure if she hit her head. They did not call EMS because when she got back to bed she passed out and  husband was unaware of the event as it happened in the bathroom. She reports being foggy minded today.  He reports gabapentin  and ropinirole  not working for RLS. Requesting sooner appt.

## 2023-06-26 NOTE — Telephone Encounter (Signed)
 Polly Brink the pts husband called asking for updat with pt on speaker phone. I stated no update yet but expect to hear back from us  tomorrow

## 2023-06-27 ENCOUNTER — Emergency Department (HOSPITAL_BASED_OUTPATIENT_CLINIC_OR_DEPARTMENT_OTHER)
Admission: EM | Admit: 2023-06-27 | Discharge: 2023-06-27 | Disposition: A | Discharge status: Home / Self Care | Attending: Emergency Medicine | Admitting: Emergency Medicine

## 2023-06-27 ENCOUNTER — Encounter (HOSPITAL_BASED_OUTPATIENT_CLINIC_OR_DEPARTMENT_OTHER): Payer: Self-pay | Admitting: Emergency Medicine

## 2023-06-27 ENCOUNTER — Emergency Department (HOSPITAL_BASED_OUTPATIENT_CLINIC_OR_DEPARTMENT_OTHER)

## 2023-06-27 ENCOUNTER — Ambulatory Visit: Payer: Self-pay

## 2023-06-27 ENCOUNTER — Other Ambulatory Visit: Payer: Self-pay

## 2023-06-27 DIAGNOSIS — R29818 Other symptoms and signs involving the nervous system: Secondary | ICD-10-CM | POA: Diagnosis not present

## 2023-06-27 DIAGNOSIS — R9431 Abnormal electrocardiogram [ECG] [EKG]: Secondary | ICD-10-CM | POA: Diagnosis not present

## 2023-06-27 DIAGNOSIS — F17200 Nicotine dependence, unspecified, uncomplicated: Secondary | ICD-10-CM | POA: Insufficient documentation

## 2023-06-27 DIAGNOSIS — R519 Headache, unspecified: Secondary | ICD-10-CM | POA: Diagnosis not present

## 2023-06-27 DIAGNOSIS — R569 Unspecified convulsions: Secondary | ICD-10-CM | POA: Diagnosis not present

## 2023-06-27 LAB — CBC WITH DIFFERENTIAL/PLATELET
Abs Immature Granulocytes: 0.02 10*3/uL (ref 0.00–0.07)
Basophils Absolute: 0.1 10*3/uL (ref 0.0–0.1)
Basophils Relative: 1 %
Eosinophils Absolute: 0.1 10*3/uL (ref 0.0–0.5)
Eosinophils Relative: 2 %
HCT: 39.1 % (ref 36.0–46.0)
Hemoglobin: 12.8 g/dL (ref 12.0–15.0)
Immature Granulocytes: 0 %
Lymphocytes Relative: 19 %
Lymphs Abs: 1.6 10*3/uL (ref 0.7–4.0)
MCH: 29.9 pg (ref 26.0–34.0)
MCHC: 32.7 g/dL (ref 30.0–36.0)
MCV: 91.4 fL (ref 80.0–100.0)
Monocytes Absolute: 0.4 10*3/uL (ref 0.1–1.0)
Monocytes Relative: 5 %
Neutro Abs: 6.1 10*3/uL (ref 1.7–7.7)
Neutrophils Relative %: 73 %
Platelets: 254 10*3/uL (ref 150–400)
RBC: 4.28 MIL/uL (ref 3.87–5.11)
RDW: 13.3 % (ref 11.5–15.5)
WBC: 8.3 10*3/uL (ref 4.0–10.5)
nRBC: 0 % (ref 0.0–0.2)

## 2023-06-27 LAB — COMPREHENSIVE METABOLIC PANEL WITH GFR
ALT: 17 U/L (ref 0–44)
AST: 18 U/L (ref 15–41)
Albumin: 4.5 g/dL (ref 3.5–5.0)
Alkaline Phosphatase: 69 U/L (ref 38–126)
Anion gap: 11 (ref 5–15)
BUN: 10 mg/dL (ref 6–20)
CO2: 23 mmol/L (ref 22–32)
Calcium: 9.5 mg/dL (ref 8.9–10.3)
Chloride: 104 mmol/L (ref 98–111)
Creatinine, Ser: 0.82 mg/dL (ref 0.44–1.00)
GFR, Estimated: >60 mL/min (ref >60)
Glucose, Bld: 93 mg/dL (ref 70–99)
Potassium: 4.1 mmol/L (ref 3.5–5.1)
Sodium: 138 mmol/L (ref 135–145)
Total Bilirubin: 0.2 mg/dL (ref 0.0–1.2)
Total Protein: 6.7 g/dL (ref 6.5–8.1)

## 2023-06-27 LAB — CK: Total CK: 70 U/L (ref 38–234)

## 2023-06-27 LAB — MAGNESIUM: Magnesium: 2 mg/dL (ref 1.7–2.4)

## 2023-06-27 LAB — CBG MONITORING, ED: Glucose-Capillary: 92 mg/dL (ref 70–99)

## 2023-06-27 MED ORDER — ACETAMINOPHEN 500 MG PO TABS
1000.0000 mg | ORAL_TABLET | Freq: Once | ORAL | Status: AC
  Administered 2023-06-27: 1000 mg via ORAL
  Filled 2023-06-27: qty 2

## 2023-06-27 NOTE — ED Triage Notes (Signed)
 Pt bib daughter, pt reports unwitnessed seizure 3 night pta. Currently receiving tx from neurology. Endorses urinary incontinence, legs were twitching. EEG was normal per pt stmt. Endorse HA, vision "askewed"

## 2023-06-27 NOTE — Telephone Encounter (Signed)
  Chief Complaint: Neurologic Deficit Symptoms: seizure like activity on Sunday night with feeling like she was choking and couldn't breathe, nausea, dizziness, unsteady on her feet, speech issues, loss control of bladder Frequency: Sunday night but patient reports several night of having seizure like activity Pertinent Negatives: Patient denies fever Disposition: [x] ED /[] Urgent Care (no appt availability in office) / [] Appointment(In office/virtual)/ []  [facility]/ [] Home Care/ [] Refused Recommended Disposition /[] [facility]/ []  Follow-up with PCP Additional Notes: patient calling with hx of seizure like activity reports that Sunday night she was having issues breathing where she felt like she couldn't breath along with loss control of bladder. Patient reports that last Monday or Tuesday was the last night she was without any seizure like activity. Patient states she is unsure how long this went on. Patient states that she was having "grand mal seizure" where her arms and legs were failing about and had no control. Patient states she was able to get herself eventually back to bed. Patient reports generalized body pain today. Reports dizziness and nausea. Reports that she is having difficulty speaking to people where she is getting tongue tied. Feels unsteady on her feet. Patient reports that she called her neurologist yesterday but couldn't get a good answer from the staff.   Per protocol, patient is recommended to the emergency department due to symptoms. Discussed with patient the importance of being evaluated in the ED. Patient verbalized understanding of ED evaluation and stated that she would go. Patient instructed that she will be able to call back after being seen in the ED to make a follow up visit with PCP. All questions answered.    Copied from CRM [id number]. Topic: Clinical - Red Word Triage >> Jun 27, 2023  9:10 AM Orien Bird wrote: [facility] that prompted transfer to  Nurse Triage: Patient stated she feels like she had a seizure and when she cough she's choking to the point it's no air to breathe in and she is wanting to see someone. Reason for Disposition  Patient sounds very sick or weak to the triager  Answer Assessment - Initial Assessment Questions 1. SYMPTOM: "What is the main symptom you are concerned about?" (e.g., weakness, numbness)     Possible seizure activity, dizziness, lost control of bladder on Sunday night 2. ONSET: "When did this start?" (minutes, hours, days; while sleeping)     Patient reports that Sunday night seizure activity with possible choking  3. LAST NORMAL: "When was the last time you (the patient) were normal (no symptoms)?"     Last Monday or Tuesday night was her last normal 4. PATTERN "Does this come and go, or has it been constant since it started?"  "Is it present now?"     Comes and goes 5. CARDIAC SYMPTOMS: "Have you had any of the following symptoms: chest pain, difficulty breathing, palpitations?"     no 6. NEUROLOGIC SYMPTOMS: "Have you had any of the following symptoms: headache, dizziness, vision loss, double vision, changes in speech, unsteady on your feet?"     Dizziness, unsteady on your feet, vision concerns, changes in speech 7. OTHER SYMPTOMS: "Do you have any other symptoms?"     nausea  Protocols used: Neurologic Deficit-A-AH

## 2023-06-27 NOTE — ED Provider Notes (Addendum)
 [place] EMERGENCY DEPARTMENT AT [facility] Provider Note   CSN: [id number] Arrival date & time: 06/27/23  1013     History  Chief Complaint  Patient presents with   Seizures    Kathryn Brown is a 48 y.o. female.  Patient followed by [facility] neurology.  Is been followed by them for a while.  Patient with challenging type seizures that seem to be more focal in nature.  Patient's had a negative MRI within the last year and has had 2 EEGs that were negative.  [facility] did want to admit and do a 5-day observation.  Patient not able to do that because of her job.  Patient not last night but the night before she has these extremity twitching events.  Patient also had choking episode associated with the.  Patient did have a fall associated with it denies any neck pain or head pain.  Patient is on antiseizure medicine by [facility] neurology.  Patient states it is not helping.  Patient currently without any significant abnormalities other than feeling foggy in the head.  Patient is endorsing headache.  Past medical history significant for mitral valve prolapse the history of seizures.  Patient former smoker but still smokes a half a pack a day.       Home Medications Prior to Admission medications   Medication Sig Start Date End Date Taking? Authorizing Provider  Ascorbic Acid (VITAMIN C) POWD Take 1,000 mg by mouth daily.    [provider]  Ferrous Sulfate Dried (SLOW RELEASE IRON ) 45 MG TBCR Take 1 capsule by mouth daily. 06/16/23   Versa Gore, NP  gabapentin  (NEURONTIN ) 300 MG capsule TAKE 2 TO 3 CAPSULES(600 TO 900 MG) BY MOUTH AT BEDTIME 02/09/23   Glory Larsen, MD  rOPINIRole  (REQUIP ) 0.25 MG tablet Take 1 tablet (0.25 mg total) by mouth in the morning and at bedtime. 03/16/23   Camara, Amadou, MD  traZODone  (DESYREL ) 50 MG tablet Take 1 tablet (50 mg total) by mouth at bedtime. 06/07/23   Camara, Amadou, MD      Allergies    Sulfa antibiotics,  Compazine  [prochlorperazine ], and Topiramate     Review of Systems   Review of Systems  Constitutional:  Negative for chills and fever.  HENT:  Negative for ear pain and sore throat.   Eyes:  Negative for pain and visual disturbance.  Respiratory:  Negative for cough and shortness of breath.   Cardiovascular:  Negative for chest pain and palpitations.  Gastrointestinal:  Negative for abdominal pain and vomiting.  Genitourinary:  Negative for dysuria and hematuria.  Musculoskeletal:  Positive for myalgias. Negative for arthralgias and back pain.  Skin:  Negative for color change and rash.  Neurological:  Positive for seizures and headaches. Negative for syncope.  All other systems reviewed and are negative.   Physical Exam Updated Vital Signs BP 101/66 (BP Location: Right Arm)   Pulse 77   Temp 97.8 F (36.6 C)   Resp 18   LMP 06/13/2023   SpO2 98%  Physical Exam Vitals and nursing note reviewed.  Constitutional:      General: She is not in acute distress.    Appearance: Normal appearance. She is well-developed.  HENT:     Head: Normocephalic and atraumatic.  Eyes:     Extraocular Movements: Extraocular movements intact.     Conjunctiva/sclera: Conjunctivae normal.     Pupils: Pupils are equal, round, and reactive to light.  Cardiovascular:     Rate  and Rhythm: Normal rate and regular rhythm.     Heart sounds: No murmur heard. Pulmonary:     Effort: Pulmonary effort is normal. No respiratory distress.     Breath sounds: Normal breath sounds.  Abdominal:     Palpations: Abdomen is soft.     Tenderness: There is no abdominal tenderness.  Musculoskeletal:        General: No swelling.     Cervical back: Normal range of motion and neck supple. No rigidity.  Skin:    General: Skin is warm and dry.     Capillary Refill: Capillary refill takes less than 2 seconds.  Neurological:     General: No focal deficit present.     Mental Status: She is alert and oriented to person,  place, and time.  Psychiatric:        Mood and Affect: Mood normal.     ED Results / Procedures / Treatments   Labs (all labs ordered are listed, but only abnormal results are displayed) Labs Reviewed  CBC WITH DIFFERENTIAL/PLATELET  COMPREHENSIVE METABOLIC PANEL WITH GFR  MAGNESIUM  CK  CBG MONITORING, ED    EKG EKG Interpretation Date/Time:  Tuesday June 27 2023 10:30:08 EDT Ventricular Rate:  79 PR Interval:  156 QRS Duration:  80 QT Interval:  382 QTC Calculation: 438 R Axis:   75  Text Interpretation: Sinus rhythm with Premature atrial complexes with Abberant conduction Otherwise normal ECG When compared with ECG of 29-Mar-2023 13:34, Abberant conduction is now Present Interpretation limited secondary to artifact Confirmed by Trica Usery [phone]) on 06/27/2023 10:41:05 AM  Radiology No results found.  Procedures Procedures    Medications Ordered in ED Medications - No data to display  ED Course/ Medical Decision Making/ A&P                                 Medical Decision Making Amount and/or Complexity of Data Reviewed Labs: ordered. Radiology: ordered.  Risk OTC drugs.   Patient's history could be consistent with some type of focal seizure.  Long question with patient to let him know what were able to do here today.  Did confirm with neurology's recommendation maybe for inpatient stay for observation.  Will get labs here will get CT head.  MRI not available here.  But I do not feel that it is critical that she have an MRI.  CBC white count 8.3 hemoglobin 12.8 platelets 254.  Complete metabolic panel pending magnesium pending CK was done because she is talking about feeling achy all over.  And will get head CT.  And will get EKG which was done had some artifact but nothing acute.  Will put on cardiac monitor.  Patient without any choking episodes currently.  Good movement of air and voice is normal.  Patient swallowing fine.  Patient's labs  reassuring CBC white count normal hemoglobin normal platelets are normal.  Complete metabolic panel normal including renal function magnesium normal CK also normal.  Glucose 92.  CT head without any acute findings.  Will discuss with patient.  Will give referral to [facility] neurology locally also did discuss possibly following up with [facility] neurology or [facility] neurology.  Continue current medications.  Patient's workup CBC normal complete metabolic panel normal magnesium normal CK at 70 send no concerns for rhabdomyolysis.  Glucose 92 head CT without any acute findings.  Stable for discharge home.  Final Clinical Impression(s) /  ED Diagnoses Final diagnoses:  Seizure-like activity [facility])    Rx / DC Orders ED Discharge Orders     None         Nicklas Barns, MD 06/27/23 1142    Nicklas Barns, MD 06/27/23 1146    Nicklas Barns, MD 06/27/23 1351    Nicklas Barns, MD 06/27/23 1401

## 2023-06-27 NOTE — Telephone Encounter (Signed)
 Pt has called and the message from CMA was relayed.  Pt is asking what update is she supposed to be waiting on, phone rep relayed this is response from CMA and that no other information was available to relay.  Pt expressed not being satisfied with this response, and then asked for the name of the president and name of vice president of the practice, that information was given.

## 2023-06-27 NOTE — Discharge Instructions (Signed)
 Would recommend getting [facility] neurology call for second opinion here locally.  Would also consider maybe a second pinon from [facility] neurology or [facility] neurology.  Return for any new or worse symptoms.  Today's workup without any acute findings.

## 2023-06-27 NOTE — ED Notes (Signed)
CBG 83 

## 2023-06-27 NOTE — ED Notes (Signed)
 Pt discharged home and given discharge paperwork. Opportunities given for questions. Pt verbalizes understanding. PIV removed x1. Jillyn Hidden , RN

## 2023-06-28 LAB — CBG MONITORING, ED: Glucose-Capillary: 83 mg/dL (ref 70–99)

## 2023-06-28 NOTE — Telephone Encounter (Signed)
 Noted. Also reviewed ED note, pt to follow up with Neurology.

## 2023-06-29 NOTE — Telephone Encounter (Signed)
 We discussed these events in the past, and the next step is for patient to be admitted to the hospital, epilepsy monitoring unit. If she is willing to be admitted, please let us  know and I will send the order.

## 2023-06-30 ENCOUNTER — Encounter: Payer: Self-pay | Admitting: Family

## 2023-06-30 ENCOUNTER — Ambulatory Visit: Admitting: Family

## 2023-06-30 VITALS — BP 106/72 | HR 79 | Temp 98.2°F | Ht 68.5 in | Wt 182.5 lb

## 2023-06-30 DIAGNOSIS — F4323 Adjustment disorder with mixed anxiety and depressed mood: Secondary | ICD-10-CM | POA: Insufficient documentation

## 2023-06-30 DIAGNOSIS — G479 Sleep disorder, unspecified: Secondary | ICD-10-CM | POA: Diagnosis not present

## 2023-06-30 DIAGNOSIS — G2581 Restless legs syndrome: Secondary | ICD-10-CM

## 2023-06-30 DIAGNOSIS — R790 Abnormal level of blood mineral: Secondary | ICD-10-CM | POA: Diagnosis not present

## 2023-06-30 DIAGNOSIS — G4769 Other sleep related movement disorders: Secondary | ICD-10-CM

## 2023-06-30 NOTE — Assessment & Plan Note (Signed)
 Iron  deficiency managed with supplementation. Previous labs showed low ferritin levels. Vitamin D  supplementation recommended due to low normal levels. - Continue iron  supplementation. - Recheck labs in approximately two months to assess ferritin levels. - Increase vitamin D  intake to 4000 units daily.

## 2023-06-30 NOTE — Assessment & Plan Note (Signed)
 Significant sleep disturbances with nocturnal convulsions and possible apneic episodes. Trazodone  ineffective. Concerns about sleep medications due to nocturnal seizures. Sleep study pending, scheduled for 5/13; home study option if insurance does not cover in-clinic study. Pt reluctant to increase Trazodone  or switch to other med for sleep d/t risk of increased seizure activity. - Coordinate with [facility] to ensure sleep study is scheduled and conducted. - Consider home sleep study if insurance does not cover in-clinic study.

## 2023-06-30 NOTE — Progress Notes (Signed)
 Patient ID: Kathryn Brown, female    DOB: 13-Nov-1975, 48 y.o.   MRN: [id number] Chief Complaint  Patient presents with   Anxiety  Discussed the use of AI scribe software for clinical note transcription with the patient, who gave verbal consent to proceed.  History of Present Illness Kathryn Brown is a 48 year old female with seizures who presents with her husband for ongoing seizures and sleep disturbances.  Seizures have been occurring for about two years, with recent episodes on Sunday and Wednesday nights. These involve convulsions and difficulty breathing, described as choking episodes. During the Sunday episode, she experienced apnea followed by convulsions, resulting in muscle pain and bruises. Her partner observed the Wednesday episode, noting arm raising, leg twitching, and gasping for air. Current medications include trazodone , ropinirole , and gabapentin . Trazodone  is ineffective for sleep, and she is hesitant to increase the dose due to seizure concerns. Gabapentin  is taken at 300 mg nightly. Ropinirole  causes fogginess and does not reduce daytime twitching. She experiences anxiety and depression, linked to her medical issues and stress from work. Previous trials of Prozac  and Lamictal  were not beneficial for her convulsions, anxiety or depression. She is frustrated by the lack of clear answers and the impact on her work. At most recent ED visit, she requested referral to [facility], however, referral denied d/t already seeking 2nd opinion via [facility]. She recently started taking an iron  supplement for low ferritin level, she is also taking Vitamin D  daily. She is concerned about potential weight gain from medications but has lost two pounds since her last visit.  Assessment & Plan Night time seizure-like activity - Seizures persist with choking and convulsions. Previous EEGs and brain imaging all negative. Neuropsychiatry referral considered for mood disorder interaction.  Transfer care to [facility] desired from [facility].  - Continue Gabapentin  300mg  qhs - Stop Ropinirole  during day, increase dose to 0.5mg  qhs. - Call [facility] for eval appt, will send referral if needed, notify office. - Send referral to neuropsychiatry for evaluation of potential interaction with mood disorders. - Contact [facility] to request TOC from [facility]  Sleep disturbances Significant sleep disturbances with nocturnal convulsions and possible apneic episodes. Trazodone  ineffective. Concerns about sleep medications due to nocturnal seizures. Sleep study pending, scheduled for 5/13; home study option if insurance does not cover in-clinic study. Pt reluctant to increase Trazodone  or switch to other med for sleep d/t risk of increased seizure activity. - Coordinate with [facility] to ensure sleep study is scheduled and conducted. - Consider home sleep study if insurance does not cover in-clinic study.   Anxiety and Depression High PHQ and GAD scores. Anxiety and depression exacerbated by medical issues, including lack of sleep. Prozac  and Lamictal  ineffective in past. Previous counseling deemed her well-adjusted, but symptoms persist. - Discuss potential short-term treatment options for anxiety and depression. - Refer to neuropsychiatry for further evaluation and management.  Restless legs syndrome Restless legs syndrome persists despite ropinirole  treatment causing fogginess. Previous dosage increase not well tolerated. - Increase ropinirole  to 0.5 mg at night and discontinue morning dose. - Monitor for side effects and effectiveness of increased dose.  Iron  deficiency - Iron  deficiency managed with supplementation. Previous labs showed low ferritin levels. Vitamin D  supplementation recommended due to low normal levels. - Continue iron  supplementation. - Recheck labs in approximately two months to assess ferritin levels. - Increase vitamin D  intake to 4000  units daily.  Subjective:    Outpatient Medications Prior  to Visit  Medication Sig Dispense Refill   Ascorbic Acid (VITAMIN C) POWD Take 1,000 mg by mouth daily.     Ferrous Sulfate Dried (SLOW RELEASE IRON ) 45 MG TBCR Take 1 capsule by mouth daily. 90 tablet 1   gabapentin  (NEURONTIN ) 300 MG capsule TAKE 2 TO 3 CAPSULES(600 TO 900 MG) BY MOUTH AT BEDTIME 270 capsule 1   OVER THE COUNTER MEDICATION magnesium     rOPINIRole  (REQUIP ) 0.25 MG tablet Take 1 tablet (0.25 mg total) by mouth in the morning and at bedtime. 60 tablet 11   traZODone  (DESYREL ) 50 MG tablet Take 1 tablet (50 mg total) by mouth at bedtime. 30 tablet 0   No facility-administered medications prior to visit.   Past Medical History:  Diagnosis Date   Chest pain    Heart murmur    Heart palpitations    History of stomach ulcers    as a child   MVP (mitral valve prolapse) 10/30/2011   Seizure (HCC)    Past Surgical History:  Procedure Laterality Date   TONSILECTOMY, ADENOIDECTOMY, BILATERAL MYRINGOTOMY AND TUBES     TONSILLECTOMY     Allergies  Allergen Reactions   Sulfa Antibiotics Anaphylaxis   Compazine  [Prochlorperazine ]     Paradoxical reaction.   Topiramate  Other (See Comments)    Suicidal thoughts      Objective:    Physical Exam Vitals and nursing note reviewed.  Constitutional:      Appearance: Normal appearance.  Cardiovascular:     Rate and Rhythm: Normal rate and regular rhythm.  Pulmonary:     Effort: Pulmonary effort is normal.     Breath sounds: Normal breath sounds.  Musculoskeletal:        General: Normal range of motion.  Skin:    General: Skin is warm and dry.  Neurological:     Mental Status: She is alert.  Psychiatric:        Mood and Affect: Mood normal.        Behavior: Behavior normal.   *Extra time spent ( ) with patient today which consisted of chart review, discussing diagnoses, work up, treatment, answering questions, and documentation.  BP 106/72 (BP  Location: Left Arm, Patient Position: Sitting, Cuff Size: Large)   Pulse 79   Temp 98.2 F (36.8 C) (Temporal)   Ht 5' 8.5" (1.74 m)   Wt 182 lb 8 oz (82.8 kg)   LMP 06/13/2023   SpO2 99%   BMI 27.35 kg/m  Wt Readings from Last 3 Encounters:  06/30/23 182 lb 8 oz (82.8 kg)  06/20/23 184 lb 9.6 oz (83.7 kg)  06/13/23 185 lb (83.9 kg)     Versa Gore, NP

## 2023-06-30 NOTE — Assessment & Plan Note (Signed)
 Seizures persist with choking and convulsions. Previous EEGs and brain imaging all negative. Neuropsychiatry referral considered for mood disorder interaction. Transfer care to [facility] desired from [facility].  - Continue Gabapentin  300mg  qhs - Stop Ropinirole  during day, increase dose to 0.5mg  qhs. - Call [facility] for eval appt, will send referral if needed, notify office. - Send referral to neuropsychiatry for evaluation of potential interaction with mood disorders. - Contact [facility] to request TOC from [facility]

## 2023-06-30 NOTE — Assessment & Plan Note (Signed)
 Restless legs syndrome persists despite ropinirole  treatment causing fogginess. Previous dosage increase not well tolerated. - Increase ropinirole  to 0.5 mg at night and discontinue morning dose. - Monitor for side effects and effectiveness of increased dose.

## 2023-07-03 ENCOUNTER — Telehealth: Payer: Self-pay

## 2023-07-03 NOTE — Telephone Encounter (Signed)
 Reached out to [facility], appointment confirmed for consult to order sleep study.

## 2023-07-03 NOTE — Telephone Encounter (Signed)
-----   Message from Versa Gore sent at 06/30/2023  9:32 PM EDT ----- Regarding: Call Neurology Please call [facility] and confirm pt has appointment on 5/13 to discuss ordering a sleep study. We can see the appointment, but pt reports she has left messages and not heard back and is afraid it will be canceled. Let me know. Thx

## 2023-07-04 NOTE — Telephone Encounter (Addendum)
 I called and spoke with pt, pt verbalized understanding.

## 2023-07-04 NOTE — Telephone Encounter (Signed)
 TY, please let pt know, thanks.

## 2023-07-05 ENCOUNTER — Telehealth: Payer: Self-pay | Admitting: Neurology

## 2023-07-05 ENCOUNTER — Other Ambulatory Visit: Payer: Self-pay | Admitting: Neurology

## 2023-07-05 ENCOUNTER — Telehealth: Payer: Self-pay

## 2023-07-05 DIAGNOSIS — R569 Unspecified convulsions: Secondary | ICD-10-CM

## 2023-07-05 MED ORDER — LAMOTRIGINE 25 MG PO TABS: ORAL_TABLET | ORAL | 3 refills | Status: DC

## 2023-07-05 NOTE — Telephone Encounter (Signed)
 Referral for neurology fax and sent through [facility] to [facility].  Phone:[phone] Fax: [phone]

## 2023-07-05 NOTE — Telephone Encounter (Signed)
 Call to patient reviewed restarting lamictal  and titration. She requests call her husband as she states " she is just crashing". I also advised she should go to ER if these persist or she is feeling they are going to happen. She verbalized understanding.   Call to husband, reviewed all recommendations and he is in agreement  with all recommendations.

## 2023-07-05 NOTE — Telephone Encounter (Signed)
 Call to patient after receiving mychart message with attached video. She states she is no longer taking the lamictal  as she thought she was told to stop it. MRI and ambulatory EEG negative, is unable to go to EMU. Advised Dr. Samara Crest out of office but did schedule her at next available and would send to work in to review messages and possibly adding medication until can see Dr. Samara Crest. Patient appreciative of call

## 2023-07-05 NOTE — Telephone Encounter (Signed)
 I am working remotely, I will put in the order. Thanks

## 2023-07-05 NOTE — Telephone Encounter (Signed)
 Restart lamotrigine .  Meds ordered this encounter  Medications   lamoTRIgine  (LAMICTAL ) 25 MG tablet    Sig: Take 25mg  twice a day for 2 weeks; then take 50mg  twice a day for 2 weeks; then take 75mg  twice a day for 2 weeks; then 100mg  twice a day    Dispense:  240 tablet    Refill:  3    Omega Bible, MD 07/05/2023, 11:09 AM Certified in Neurology, Neurophysiology and Neuroimaging  [facility] [address], [address] [place], [place] [address] [phone]

## 2023-07-11 ENCOUNTER — Institutional Professional Consult (permissible substitution): Admitting: Neurology

## 2023-07-12 ENCOUNTER — Ambulatory Visit: Admitting: Neurology

## 2023-07-12 ENCOUNTER — Encounter: Payer: Self-pay | Admitting: Neurology

## 2023-07-12 VITALS — BP 113/58 | HR 86 | Ht 68.0 in | Wt 183.0 lb

## 2023-07-12 DIAGNOSIS — G4753 Recurrent isolated sleep paralysis: Secondary | ICD-10-CM | POA: Insufficient documentation

## 2023-07-12 DIAGNOSIS — F514 Sleep terrors [night terrors]: Secondary | ICD-10-CM | POA: Diagnosis not present

## 2023-07-12 DIAGNOSIS — G4750 Parasomnia, unspecified: Secondary | ICD-10-CM | POA: Diagnosis not present

## 2023-07-12 DIAGNOSIS — R569 Unspecified convulsions: Secondary | ICD-10-CM | POA: Insufficient documentation

## 2023-07-12 NOTE — Patient Instructions (Addendum)
 48 y.o. year old female patient of Dr Elspeth Hals here with:    1) Nocturnal episodes that occur within 60 minutes of sleep onset, started 2 years ago. No TBI known.   2) patient has been treated for presumed restless legs - and the medication has been taken for years.  Could this lead to the onset ?  She had been taking it 5 years ago for the first time and weaned herself off and now restarted 2 years ago.   Plan: Dr Samara Crest did an ambulatory EEG , 3 days long, summer 2024. At that time no Episodes.  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 I plan to have another at home EEG for her, now that her spells have become so frequent.  Dr Samara Crest to decide if she is able to d/c Lamictal  and gabapentin  at this time.    Nocturnal EEG  with [facility]. I want to wean her off Gabapentin  and Lamictal  before    I plan to follow up either personally or through our NP within 3-4 months.    I have asked the patient to reduce her 900 mg bedtime dose of gabapentin -Neurontin  to 600 mg from today on I also like her to no longer take Requip  in the morning.  Lamictal  which had been titrated up to 100 mg twice a day will be reduced to 75 mg twice a day for 2 weeks after 2 weeks we reduce the Lamictal  to 50 mg twice a day the Requip  will be taken off at nighttime, the Neurontin  will be reduced to 300 mg at bedtime.  I feel that then we should plan for an overnight polysomnography with full EEG montage.  I also encouraged that we make another attempt at either an ambulatory video EEG at home or an EMU stay.

## 2023-07-12 NOTE — Progress Notes (Signed)
 SLEEP MEDICINE CLINIC    Provider:  Neomia Banner, MD   Primary Care Physician:  Versa Gore, NP [address] [place] [address]     Referring Provider: Versa Gore, Np [address] [place],  [place] [address]          Chief Complaint according to patient   Patient presents with:     New Patient (Initial Visit)           HISTORY OF PRESENT ILLNESS:  Kathryn Brown is a 48 y.o. female patient who is seen upon  Dr Elspeth Hals referral on 07/12/2023  for evaluation of a possible sleep disorder.    Chief concern according to patient :   She brought her spouse who captured some nocturnal activity on Video- 07-04-23, again 2 more spells in the same week.  She wakes physically exhausted , all over sore, she fell out of bed - and there was bruising.  She went to the bathroom that night, and must have had another spell , she was on the floor - no recollection why, how. She was scared because she felt that she  couldn't breathe - that's scared her to the level that sh remained  fearful of going back to sleep.   She also wakes up with buccal bites.   The video shows her being very restless, I cannot see her face but she is multiple times turning to her back and then to her right side again partially covered by her DVT.  Her right leg seems to be extended when the left leg is flexed and the right hand was twice seen as fisted when the left arm is rather loose and able to move.  This was only 1 of several spells and the couple only recently purchased a camera to have a recording.  There are vocalizations but there are no words spoken this is more along the side of moaning groaning.  The spell happens between 11 and 12 midnight, after 45 to 60 minutes into sleep.   SLOW wave sleep night terror or seizures ??   The patient has been on Lamictal , on Gabapentin  and on Ropinirole  for a presumed RLS that is not  described to me as if it would be RLS.  She may have  had RLS in the last stages stages of pregnancy at age 85 with her first daughter.  She was severely anemic.    I have the pleasure of seeing Kathryn Brown 07/12/23 a right -handed female with a possible parasomnia or seizure related sleep disorder.     Sleep relevant medical history:  Night terrors other Parasomnia, wisdom teeth extraction.  Tonsillectomy  age 52, small mouth, history of strep throat and bronchitis.     Family medical /sleep history: her father had CAD, RLS,  on CPAP with OSA, father was getting better  when he started on blood thinners. PGM :  " having episodes".      Social history: Patient is working as  and lives in a household with spouse  , kids are grown.  The patient currently works/ self employed.  Tobacco use : none .  ETOH use :  wine , 1-2 glasses at dinner. 2 times a week. ,  Caffeine intake in form of Coffee( 8 ounces ) Soda( /) Tea ( /) or energy drinks     Sleep habits are as follows: The patient's dinner time is between 6-8 PM. The patient goes to  bed at 10 PM and continues to sleep for 6-8 hours, wakes for 0-1 bathroom breaks, the first time at 12 AM.   The preferred sleep position is laterally.  Dreams are reportedly frequent/vivid.   The patient wakes up  with an alarm  5.30 AM is the usual rise time. She reports not feeling refreshed or restored in AM, with symptoms such as dry mouth, no morning headaches, and residual fatigue. Naps are taken  rarely.    Review of Systems: Out of a complete 14 system review, the patient complains of only the following symptoms, and all other reviewed systems are negative.:  Parasomnia    How likely are you to doze in the following situations: 0 = not likely, 1 = slight chance, 2 = moderate chance, 3 = high chance   Sitting and Reading? Watching Television? Sitting inactive in a public place (theater or meeting)? As a passenger in a car for an hour without a break? Lying down in the afternoon when circumstances  permit? Sitting and talking to someone? Sitting quietly after lunch without alcohol? In a car, while stopped for a few minutes in traffic?   Post prescription him Epworth Sleepiness Scale was endorsed at 10 and fatigue severity at 63 points.  Social History   Socioeconomic History   Marital status: Married    Spouse name: Not on file   Number of children: Not on file   Years of education: Not on file   Highest education level: Some college, no degree  Occupational History   Occupation: Secretary/administrator  Tobacco Use   Smoking status: Former    Current packs/day: 0.50    Average packs/day: 0.5 packs/day for 10.0 years (5.0 ttl pk-yrs)    Types: Cigarettes   Smokeless tobacco: Never   Tobacco comments:    Little vape   Vaping Use   Vaping status: Every Day   Substances: Flavoring  Substance and Sexual Activity   Alcohol use: Yes    Alcohol/week: 4.0 standard drinks of alcohol    Types: 4 Glasses of wine per week    Comment: socia;   Drug use: No   Sexual activity: Yes    Birth control/protection: Other-see comments    Comment: spouse-vasectomy  Other Topics Concern   Not on file  Social History Narrative   Not on file   Social Drivers of Health   Financial Resource Strain: Low Risk  (06/12/2023)   Overall Financial Resource Strain (CARDIA)    Difficulty of Paying Living Expenses: Not hard at all  Food Insecurity: No Food Insecurity (06/12/2023)   Hunger Vital Sign    Worried About Running Out of Food in the Last Year: Never true    Ran Out of Food in the Last Year: Never true  Transportation Needs: No Transportation Needs (06/12/2023)   PRAPARE - Administrator, Civil Service (Medical): No    Lack of Transportation (Non-Medical): No  Physical Activity: Sufficiently Active (06/12/2023)   Exercise Vital Sign    Days of Exercise per Week: 5 days    Minutes of Exercise per Session: 30 min  Stress: Stress Concern Present (06/12/2023)   [employer]  of Occupational Health - Occupational Stress Questionnaire    Feeling of Stress : Very much  Social Connections: Unknown (06/12/2023)   Social Connection and Isolation Panel [NHANES]    Frequency of Communication with Friends and Family: More than three times a week    Frequency of Social Gatherings with  Friends and Family: Once a week    Attends Religious Services: Patient declined    Database administrator or Organizations: No    Attends Engineer, structural: Not on file    Marital Status: Married    Family History  Problem Relation Age of Onset   Heart disease Mother    Hypertension Mother    Heart disease Father    Hypertension Father    Breast cancer Neg Hx    Migraines Neg Hx     Past Medical History:  Diagnosis Date   Abnormal bruising 05/26/2022   Chest pain    Heart murmur    Heart palpitations    History of stomach ulcers    as a child   MVP (mitral valve prolapse) 10/30/2011   Seizure (HCC)     Past Surgical History:  Procedure Laterality Date   TONSILECTOMY, ADENOIDECTOMY, BILATERAL MYRINGOTOMY AND TUBES     TONSILLECTOMY       Current Outpatient Medications on File Prior to Visit  Medication Sig Dispense Refill   Ascorbic Acid (VITAMIN C) POWD Take 1,000 mg by mouth daily.     Ferrous Sulfate Dried (SLOW RELEASE IRON ) 45 MG TBCR Take 1 capsule by mouth daily. 90 tablet 1   lamoTRIgine  (LAMICTAL ) 25 MG tablet Take 25mg  twice a day for 2 weeks; then take 50mg  twice a day for 2 weeks; then take 75mg  twice a day for 2 weeks; then 100mg  twice a day 240 tablet 3   OVER THE COUNTER MEDICATION magnesium     rOPINIRole  (REQUIP ) 0.25 MG tablet Take 1 tablet (0.25 mg total) by mouth in the morning and at bedtime. 60 tablet 11   traZODone  (DESYREL ) 50 MG tablet Take 1 tablet (50 mg total) by mouth at bedtime. 30 tablet 0   No current facility-administered medications on file prior to visit.    Allergies  Allergen Reactions   Sulfa Antibiotics Anaphylaxis    Compazine  [Prochlorperazine ]     Paradoxical reaction.   Topiramate  Other (See Comments)    Suicidal thoughts     DIAGNOSTIC DATA (LABS, IMAGING, TESTING) - I reviewed patient records, labs, notes, testing and imaging myself where available.  Lab Results  Component Value Date   WBC 8.3 06/27/2023   HGB 12.8 06/27/2023   HCT 39.1 06/27/2023   MCV 91.4 06/27/2023   PLT 254 06/27/2023      Component Value Date/Time   NA 138 06/27/2023 1111   NA 140 08/19/2019 1446   K 4.1 06/27/2023 1111   CL 104 06/27/2023 1111   CO2 23 06/27/2023 1111   GLUCOSE 93 06/27/2023 1111   BUN 10 06/27/2023 1111   BUN 12 08/19/2019 1446   CREATININE 0.82 06/27/2023 1111   CREATININE 0.81 10/29/2011 1046   CALCIUM 9.5 06/27/2023 1111   PROT 6.7 06/27/2023 1111   PROT 7.3 05/25/2017 1851   ALBUMIN 4.5 06/27/2023 1111   ALBUMIN 4.7 05/25/2017 1851   AST 18 06/27/2023 1111   ALT 17 06/27/2023 1111   ALKPHOS 69 06/27/2023 1111   BILITOT 0.2 06/27/2023 1111   BILITOT 0.3 05/25/2017 1851   GFRNONAA >60 06/27/2023 1111   GFRAA 100 08/19/2019 1446   Lab Results  Component Value Date   CHOL 236 (H) 07/15/2022   HDL 77 07/15/2022   LDLCALC 140 (H) 07/15/2022   TRIG 91 07/15/2022   CHOLHDL 3.1 07/15/2022   Lab Results  Component Value Date   HGBA1C 5.1 03/29/2022  Lab Results  Component Value Date   VITAMINB12 409 04/11/2022   Lab Results  Component Value Date   TSH 0.603 03/26/2022    PHYSICAL EXAM:  Today's Vitals   07/12/23 0755  BP: (!) 113/58  Pulse: 86  Weight: 183 lb (83 kg)  Height: 5\' 8"  (1.727 m)   Body mass index is 27.83 kg/m.   Wt Readings from Last 3 Encounters:  07/12/23 183 lb (83 kg)  06/30/23 182 lb 8 oz (82.8 kg)  06/20/23 184 lb 9.6 oz (83.7 kg)     Ht Readings from Last 3 Encounters:  07/12/23 5\' 8"  (1.727 m)  06/30/23 5' 8.5" (1.74 m)  06/20/23 5' 8.5" (1.74 m)      General: The patient is awake, alert and appears not in acute distress. The  patient is well groomed. Head: Normocephalic, atraumatic. Neck is supple. Mallampati 1,  small oral opening, small jaws.  neck circumference:14.5  inches . Nasal airflow  patent.  Retrognathia is present, mildly   Dental status: biological  Cardiovascular:  Regular rate and cardiac rhythm by pulse,  without distended neck veins. Respiratory: Lungs are clear to auscultation.  Skin:  Without evidence of ankle edema, or rash. Trunk: The patient's posture is erect.   NEUROLOGIC EXAM: The patient is awake and alert, oriented to place and time.   Memory subjective described as intact.  Attention span & concentration ability appears normal.  Speech is fluent,  without  dysarthria, dysphonia or aphasia.  Mood and affect are appropriate.   Cranial nerves: no loss of smell or taste reported  Pupils are equal and briskly reactive to light. Funduscopic exam deferred .  Extraocular movements in vertical and horizontal planes were intact and without nystagmus. No Diplopia. Visual fields by finger perimetry are intact. Hearing was intact to soft voice and finger rubbing.    Facial sensation intact to fine touch.  Facial motor strength is symmetric and tongue and uvula move midline.  Neck ROM : rotation, tilt and flexion extension were normal for age and shoulder shrug was symmetrical.    Motor exam:  Symmetric bulk, tone and ROM.   Normal tone without cog -wheeling, symmetric grip strength .   Sensory:  Fine touch and vibration were normal.  Proprioception tested in the upper extremities was normal.   Coordination: Rapid alternating movements in the fingers/hands were of normal speed.  The Finger-to-nose maneuver was intact without evidence of ataxia, dysmetria or tremor.   Gait and station: Patient could rise unassisted from a seated position, walked without assistive device.  Stance observed :normal width/ base and the patient turned with 3 steps.  Toe and heel walk were deferred.  Deep  tendon reflexes: in the  upper and lower extremities are symmetric and intact.  Babinski response was deferred.     ASSESSMENT AND PLAN 48 y.o. year old female patient of Dr Elspeth Hals here with:    1) Nocturnal episodes that occur within 60 minutes of sleep onset, started 2 years ago. No TBI known.   2) patient has been treated for presumed restless legs - and the medication has been taken for years.  Could this lead to the onset ?  She had been taking it 5 years ago for the first time and weaned herself off and now restarted 2 years ago.   Plan: Dr Samara Crest did an ambulatory EEG , 3 days long, summer 2024. At that time no Episodes.  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 I plan to have another at home EEG for her, now that her spells have become so frequent.  Dr Samara Crest to decide if she is able to d/c Lamictal  and gabapentin  at this time.    Nocturnal EEG  with [facility]. I want to wean her off Gabapentin  and Lamictal  before    I plan to follow up either personally or through our NP within 3-4 months.   I would like to thank Versa Gore, NP and Versa Gore, Np [address] [place],  [place] [address] for allowing me to meet with and to take care of this pleasant patient.    After spending a total time of  55  minutes face to face and additional time for physical and neurologic examination, review of laboratory studies,  personal review of imaging studies, reports and results of other testing and review of referral information / records as far as provided in visit,   Electronically signed by: Neomia Banner, MD 07/12/2023 8:13 AM  [name] Board certified by The [employer] of Sleep Medicine and Diplomate of the [employer] of Sleep Medicine. Board certified In Neurology through the ABPN, Fellow of the [employer] of Neurology.

## 2023-07-17 ENCOUNTER — Telehealth: Payer: Self-pay

## 2023-07-17 ENCOUNTER — Encounter: Payer: Self-pay | Admitting: Family

## 2023-07-17 NOTE — Telephone Encounter (Signed)
 Please advise patient to discontinue Gabapentin , and continue with Lamotrigine  25 mg twice daily.

## 2023-07-17 NOTE — Telephone Encounter (Signed)
 Pt called Stating she was in call and it dropped was calling back to speak with April. PT wants a call back .

## 2023-07-17 NOTE — Telephone Encounter (Signed)
 Returned call to patient, she reports taking 12.5 mg Lamictal  twice daily for 3 weeks. Husband reports he was confused on how to take medications. They also report self stopping the gabapentin  2 weeks ago. Advised I would send to Dr. Samara Crest on how to proceed with medication since taking wrong. Both appreciative of call.

## 2023-07-17 NOTE — Telephone Encounter (Signed)
 Let [facility] know they refused referral due to not being able to offer anything new or different. Did they reach [facility] hospital neurology for an appointment? We can send a referral if needed, thx

## 2023-07-17 NOTE — Telephone Encounter (Signed)
 Call to patient and per Dr. Samara Crest, patient needs EMU and he does not need to see her until after EMU admission. Cancelled visit  on 07/21/23 and attempted to review medications as patient states she stopped taking gabapentin  and taking half her Lamictal . Last visit notes from Dr. Albertina Hugger stating Dr. Samara Crest to decide on medications. 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

## 2023-07-18 ENCOUNTER — Telehealth: Payer: Self-pay | Admitting: Neurology

## 2023-07-18 ENCOUNTER — Encounter: Payer: Self-pay | Admitting: Neurology

## 2023-07-18 NOTE — Telephone Encounter (Signed)
 [employer]BS pending

## 2023-07-19 NOTE — Telephone Encounter (Signed)
 no, won't make a difference and she doesn't want to terminate w/[facility] until she has the home sleep study dr dohmeier ordered.

## 2023-07-20 NOTE — Telephone Encounter (Signed)
Checked status it is still pending.  

## 2023-07-21 ENCOUNTER — Telehealth: Admitting: Emergency Medicine

## 2023-07-21 ENCOUNTER — Ambulatory Visit: Admitting: Neurology

## 2023-07-21 DIAGNOSIS — J329 Chronic sinusitis, unspecified: Secondary | ICD-10-CM | POA: Diagnosis not present

## 2023-07-21 MED ORDER — AMOXICILLIN-POT CLAVULANATE 875-125 MG PO TABS: 1.0000 | ORAL_TABLET | Freq: Two times a day (BID) | ORAL | 0 refills | Status: DC

## 2023-07-21 NOTE — Progress Notes (Signed)
 Virtual Visit Consent   Kathryn Brown, you are scheduled for a virtual visit with a [facility] provider today. Just as with appointments in the office, your consent must be obtained to participate. Your consent will be active for this visit and any virtual visit you may have with one of our providers in the next 365 days. If you have a MyChart account, a copy of this consent can be sent to you electronically.  As this is a virtual visit, video technology does not allow for your provider to perform a traditional examination. This may limit your provider's ability to fully assess your condition. If your provider identifies any concerns that need to be evaluated in person or the need to arrange testing (such as labs, EKG, etc.), we will make arrangements to do so. Although advances in technology are sophisticated, we cannot ensure that it will always work on either your end or our end. If the connection with a video visit is poor, the visit may have to be switched to a telephone visit. With either a video or telephone visit, we are not always able to ensure that we have a secure connection.  By engaging in this virtual visit, you consent to the provision of healthcare and authorize for your insurance to be billed (if applicable) for the services provided during this visit. Depending on your insurance coverage, you may receive a charge related to this service.  I need to obtain your verbal consent now. Are you willing to proceed with your visit today? Kathryn Brown has provided verbal consent on 07/21/2023 for a virtual visit (video or telephone). Sherel Dikes, PA-C  Date: 07/21/2023 10:41 AM   Virtual Visit via Video Note   I, Sherel Dikes, connected with  Kathryn Brown  ([id number], [birth year]) on 07/21/23 at 10:45 AM EDT by a video-enabled telemedicine application and verified that I am speaking with the correct person using two identifiers.  Location: Patient: Virtual Visit Location Patient:  Home Provider: Virtual Visit Location Provider: Home Office   I discussed the limitations of evaluation and management by telemedicine and the availability of in person appointments. The patient expressed understanding and agreed to proceed.    History of Present Illness: Kathryn Brown is a 48 y.o. who identifies as a female who was assigned female at birth, and is being seen today for cold symptoms.  She states that she has been having symptoms for about a week  Reports associated fever, sore throat, sinus congestion/pressure/headache.  States that she has been taking OTC meds.  Tmax at home has been 100.    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   Topiramate  Other (  See Comments)    Suicidal thoughts   Medications:  Current Outpatient Medications:    amoxicillin -clavulanate (AUGMENTIN ) 875-125 MG tablet, Take 1 tablet by mouth every 12 (twelve) hours., Disp: 14 tablet, Rfl: 0   Ascorbic Acid (VITAMIN C) POWD, Take 1,000 mg by mouth daily., Disp: , Rfl:    Ferrous Sulfate Dried (SLOW RELEASE IRON ) 45 MG TBCR, Take 1 capsule by mouth  daily., Disp: 90 tablet, Rfl: 1   lamoTRIgine  (LAMICTAL ) 25 MG tablet, Take 25mg  twice a day for 2 weeks; then take 50mg  twice a day for 2 weeks; then take 75mg  twice a day for 2 weeks; then 100mg  twice a day, Disp: 240 tablet, Rfl: 3   OVER THE COUNTER MEDICATION, magnesium, Disp: , Rfl:    rOPINIRole  (REQUIP ) 0.25 MG tablet, Take 1 tablet (0.25 mg total) by mouth in the morning and at bedtime., Disp: 60 tablet, Rfl: 11   traZODone  (DESYREL ) 50 MG tablet, Take 1 tablet (50 mg total) by mouth at bedtime., Disp: 30 tablet, Rfl: 0  Observations/Objective: Patient is well-developed, well-nourished in no acute distress.  Resting comfortably at home.  Head is normocephalic, atraumatic.  No labored breathing.  Speech is clear and coherent with logical content.  Patient is alert and oriented at baseline.    Assessment and Plan: 1. Sinusitis, unspecified chronicity, unspecified location (Primary)  Visit for worsening sinus pressure and congestion with intermittent low grad fevers.  Onset was over a week ago.  Will start Augmentin .  Patient denies pregnancy or breastfeeding. Meds ordered this encounter  Medications   amoxicillin -clavulanate (AUGMENTIN ) 875-125 MG tablet    Sig: Take 1 tablet by mouth every 12 (twelve) hours.    Dispense:  14 tablet    Refill:  0    Supervising Provider:   Corine Dice [[id number]]      Follow Up Instructions: I discussed the assessment and treatment plan with the patient. The patient was provided an opportunity to ask questions and all were answered. The patient agreed with the plan and demonstrated an understanding of the instructions.  A copy of instructions were sent to the patient via MyChart unless otherwise noted below.     The patient was advised to call back or seek an in-person evaluation if the symptoms worsen or if the condition fails to improve as anticipated.    Sherel Dikes, PA-C

## 2023-07-21 NOTE — Patient Instructions (Signed)
  Kathryn Brown, thank you for joining Sherel Dikes, PA-C for today's virtual visit.  While this provider is not your primary care provider (PCP), if your PCP is located in our provider database this encounter information will be shared with them immediately following your visit.   A [facility] MyChart account gives you access to today's visit and all your visits, tests, and labs performed at [facility] " click here if you don't have a [facility] MyChart account or go to [email or web address]  Consent: (Patient) Kathryn Brown provided verbal consent for this virtual visit at the beginning of the encounter.  Current Medications:  Current Outpatient Medications:    amoxicillin -clavulanate (AUGMENTIN ) 875-125 MG tablet, Take 1 tablet by mouth every 12 (twelve) hours., Disp: 14 tablet, Rfl: 0   Ascorbic Acid (VITAMIN C) POWD, Take 1,000 mg by mouth daily., Disp: , Rfl:    Ferrous Sulfate Dried (SLOW RELEASE IRON ) 45 MG TBCR, Take 1 capsule by mouth daily., Disp: 90 tablet, Rfl: 1   lamoTRIgine  (LAMICTAL ) 25 MG tablet, Take 25mg  twice a day for 2 weeks; then take 50mg  twice a day for 2 weeks; then take 75mg  twice a day for 2 weeks; then 100mg  twice a day, Disp: 240 tablet, Rfl: 3   OVER THE COUNTER MEDICATION, magnesium, Disp: , Rfl:    rOPINIRole  (REQUIP ) 0.25 MG tablet, Take 1 tablet (0.25 mg total) by mouth in the morning and at bedtime., Disp: 60 tablet, Rfl: 11   traZODone  (DESYREL ) 50 MG tablet, Take 1 tablet (50 mg total) by mouth at bedtime., Disp: 30 tablet, Rfl: 0   Medications ordered in this encounter:  Meds ordered this encounter  Medications   amoxicillin -clavulanate (AUGMENTIN ) 875-125 MG tablet    Sig: Take 1 tablet by mouth every 12 (twelve) hours.    Dispense:  14 tablet    Refill:  0    Supervising Provider:   Corine Dice [[id number]]     *If you need refills on other medications prior to your next appointment, please contact your  pharmacy*  Follow-Up: Call back or seek an in-person evaluation if the symptoms worsen or if the condition fails to improve as anticipated.  [facility] [phone]  Other Instructions    If you have been instructed to have an in-person evaluation today at a local Urgent Care facility, please use the link below. It will take you to a list of all of our available [facility], including address, phone number and hours of operation. Please do not delay care.  [facility]  If you or a family member do not have a primary care provider, use the link below to schedule a visit and establish care. When you choose a [facility] primary care physician or advanced practice provider, you gain a long-term partner in health. Find a Primary Care Provider  Learn more about [facility]'s in-office and virtual care options:  - Get Care Now

## 2023-08-01 NOTE — Telephone Encounter (Signed)
 [employer] [place] [employer]: [id number] (exp. 07/18/23 to 09/15/23)

## 2023-08-03 NOTE — Telephone Encounter (Signed)
 According to the medication list on file pt will have to be off of the lamictal , trazodone  and requip  14 days prior to the scheduled sleep study. Dr Dohmeier wanted to confirm with Dr Samara Crest on whether the pt can come off of the lamictal  for the study.  He responded that she can and recommends  "Lamotrigine  100 mg BID. Please have her decrease to 50 mg twice daily then stop the medication 14 days prior to the test." According to may 19 mychart message the patient was advised to continue lamictal  at 25 mg BID.  Please clarify what dose of lamictal  she is on. If only taking 25 mg twice a day then she can plan to just stop the medication 14 days prior the test 7/14.  Need to confirm she is not taking any other medications that are not listed on the med list

## 2023-08-03 NOTE — Telephone Encounter (Signed)
 NPSG Seizure [employer]: [id number] (exp. 07/18/23 to 09/15/23)  Patient is scheduled at [facility] for 09/11/23 at 9 pm.  Mailed packet and sent mychart.  When I spoke with the patient she asked me if she needs to come off of Lamictal  or any of type of medicine before she has the sleep study. If so for how long?

## 2023-08-04 NOTE — Telephone Encounter (Signed)
 Attempted to call Pt regarding meds to hold for sleep study. No answer, LVM for call back.

## 2023-08-07 NOTE — Telephone Encounter (Signed)
 I was ok with weaning her off for the sleep study but Dr. Albertina Hugger wanted to EMU first prior to Sleep study. I would recomment to

## 2023-08-07 NOTE — Telephone Encounter (Signed)
 Please review message below from Dr. Albertina Hugger, she will need EMU first before the sleep study, therefore no need to reduce or taper her lamotrigine  at the moment. Thanks

## 2023-08-07 NOTE — Telephone Encounter (Signed)
 Call to patient, she states she was on site but had a minute.  I asked if she was able to schedule her visit with EMU and she said " oh well I'm gonna have to call you back" I offered to send a mychart message with the info/questions I had and she said "no this can't be a mychart, I have things to say". I advised to call back at her convenience. She was in agreement.

## 2023-08-10 NOTE — Telephone Encounter (Signed)
 I will not see her without EMU .

## 2023-09-11 ENCOUNTER — Encounter

## 2023-09-19 ENCOUNTER — Ambulatory Visit: Payer: BC Managed Care – PPO | Admitting: Neurology

## 2023-09-19 ENCOUNTER — Encounter: Payer: Self-pay | Admitting: Neurology

## 2023-09-19 VITALS — BP 115/79 | HR 79 | Resp 16

## 2023-09-19 DIAGNOSIS — R569 Unspecified convulsions: Secondary | ICD-10-CM | POA: Diagnosis not present

## 2023-09-19 MED ORDER — LAMOTRIGINE 150 MG PO TABS: 150.0000 mg | ORAL_TABLET | Freq: Every day | ORAL | 3 refills | Status: DC

## 2023-09-19 MED ORDER — LAMOTRIGINE 150 MG PO TABS: 150.0000 mg | ORAL_TABLET | Freq: Two times a day (BID) | ORAL | 3 refills | Status: AC

## 2023-09-19 NOTE — Patient Instructions (Signed)
 Continue with lamotrigine  150 mg twice daily Continue your other medications Please follow with [facility] in August Return in 6 to 8 months or sooner if worse.

## 2023-09-19 NOTE — Progress Notes (Signed)
 [name]  PATIENT: Kathryn Brown DOB: 1975/12/11  REQUESTING CLINICIAN: Billy Philippe SAUNDERS, NP HISTORY FROM: Patient  REASON FOR VISIT: Events concerning for seizures    HISTORICAL  CHIEF COMPLAINT:  Chief Complaint  Patient presents with   Follow-up    Rm13, alone, Adult night terrors Seizure-like activity: last sz was about a month ago. Denied missing medication at that time but stated that was the time when the Lamictal  was tapering up. Sleep paralysis:pt stated that the sleep paralysis has been way easier since stopped taking gabapentin .  recurrent isolated Parasomnia, 6 month f/u.    Seizures    Rm13, alone, last sz was a month ago but occurred during the taper up    Insomnia    Rm13, alone, Sleep paralysis:pt stated that the sleep paralysis has been way easier since stopped taking gabapentin .  recurrent isolated Parasomnia, 6 month f/u. Pt was removed from trazadone and would like to get back on it    INTERVAL HISTORY 09/19/2023 Patient presents today for follow-up, last visit was in January at that time again we have recommended EMU admission for capture and characterization of these events but she tells me that she could not do it due to her work schedule.   She is seeking a second opinion, actually has an appointment with [facility] in August.  During this time she tells me that she has stopped her gabapentin  and Requip , has increased her lamotrigine  to currently 125 mg twice daily and she has been doing very well.  She tells me for the past month she has not had any event or at least not aware of any event.  She is more awake during the daytime, more focus she does not feel sleepy as before.  No issues with her speech.    INTERVAL HISTORY 03/16/2023:  Patient was called today for a video visit. Husband was present. She tells me since last visit, she feels like her symptoms are getting worse:  Problem getting consistent amount of sleep. She wakes up  in the middle of the night to move around.  Leg shake during sleep, affecting her sleep, Not able to get quality of sleep  Tremors  Arm movements Hearing things  Vision problems Having hard time during the day, not able to function because she is not getting enough sleep During the day, she feels like she has electrical activity going thru her body. She has increased her dose of Requip  with good benefit of the RLS but the next morning, she feels sluggish and sleepy. She has also used hydroxyzine  and Opioid which have helped.  Increase stress related to work  Presyncopal feeling but has not had a syncopal episode  Tells me that she still has ?somnambulism, will wake in a different part of the house, not knowing how she got there. She was recommended a lab sleep study but insurance did not approved. Home sleep study was ordered but not completed.    INTERVAL HISTORY 09/29/2022:  Patient presents today for follow-up, he is accompanied by her husband.  Since last visit, she reports that her symptoms are the same, she has not seen any changes despite being on gabapentin  1200 mg at night and lamotrigine  XR 100 mg nightly.  She has completed her ambulatory EEG and was negative.  Husband reports that events usually occur at night sometimes she will have arm jerk like myoclonus, sometimes the jerks are sustained.  They do not have any video of these events.  They are wondering if these events can be secondary to autosomal dominant nocturnal frontal lobe epilepsy since both grandparents had the same type of issue with sleep   HISTORY OF PRESENT ILLNESS:  This is a 48 year old woman with history of restless leg syndrome who is presenting with paroxysmal events described as spacing out, unexplained fall and convulsion during sleep.  She reports on Mar 09 2021, fell out of office chair at work, found by daughter, went to ED. She cannot recall how she fell of the chair. Her work up was negative.  She reports a  long history of getting trouble in school for not paying attention, she was even put in Special Ed course but does not have any history of intellectual disability. She did well in school because in order to focus she must write everything down or else she will lose her attention. She has never been work up for ADD.  In her adult life, she reports that she tends to pause during conversation for up to 5 second and she is able to pick back up where she left off. These episodes tend to be worse when she is fatigue, stressed out or sick.   The week of Thanksgiving, she had a unexplained fall at home, and bruises her right ankle. She just found herself on the floor, does not remember how she got there.   In January 2024, she experienced right side of face tingling sensation, then followed by tiredness, no loss of consciousness. She had visual disturbances, disorded sound, was very confused. She was seen in the ED, was given a migraine cocktail and then experienced an episode of convulsion.   Reports that her husband has told her in the past she had convulsion during her sleep, she has no memory of it.   Her work up so far including MRI brain and EEG were normal, she is pending an ambulatory EEG.   She has seen Dr. Ines who started her on Lamotrigine  and she reports feeling better on this medication, currently she is on Lamotrigine  75 mg and plan is to go up to 100 mg.     OTHER MEDICAL CONDITIONS: Restless leg syndrome   REVIEW OF SYSTEMS: Full 14 system review of systems performed and negative with exception of: As noted in the HPI   ALLERGIES: Allergies  Allergen Reactions   Sulfa Antibiotics Anaphylaxis   Compazine  [Prochlorperazine ]     Paradoxical reaction.   Topiramate  Other (See Comments)    Suicidal thoughts    HOME MEDICATIONS: Outpatient Medications Prior to Visit  Medication Sig Dispense Refill   Ascorbic Acid (VITAMIN C) POWD Take 1,000 mg by mouth daily.     Ferrous Sulfate  Dried (SLOW RELEASE IRON ) 45 MG TBCR Take 1 capsule by mouth daily. 90 tablet 1   OVER THE COUNTER MEDICATION magnesium     lamoTRIgine  (LAMICTAL ) 100 MG tablet Take 100 mg by mouth daily.     lamoTRIgine  (LAMICTAL ) 25 MG tablet Take 25mg  twice a day for 2 weeks; then take 50mg  twice a day for 2 weeks; then take 75mg  twice a day for 2 weeks; then 100mg  twice a day 240 tablet 3   amoxicillin -clavulanate (AUGMENTIN ) 875-125 MG tablet Take 1 tablet by mouth every 12 (twelve) hours. 14 tablet 0   rOPINIRole  (REQUIP ) 0.25 MG tablet Take 1 tablet (0.25 mg total) by mouth in the morning and at bedtime. 60 tablet 11   traZODone  (DESYREL ) 50 MG tablet Take 1 tablet (50  mg total) by mouth at bedtime. 30 tablet 0   No facility-administered medications prior to visit.    PAST MEDICAL HISTORY: Past Medical History:  Diagnosis Date   Abnormal bruising 05/26/2022   Chest pain    Heart murmur    Heart palpitations    History of stomach ulcers    as a child   MVP (mitral valve prolapse) 10/30/2011   Seizure (HCC)     PAST SURGICAL HISTORY: Past Surgical History:  Procedure Laterality Date   TONSILECTOMY, ADENOIDECTOMY, BILATERAL MYRINGOTOMY AND TUBES     TONSILLECTOMY      FAMILY HISTORY: Family History  Problem Relation Age of Onset   Heart disease Mother    Hypertension Mother    Heart disease Father    Hypertension Father    Breast cancer Neg Hx    Migraines Neg Hx     SOCIAL HISTORY: Social History   Socioeconomic History   Marital status: Married    Spouse name: Not on file   Number of children: Not on file   Years of education: Not on file   Highest education level: Some college, no degree  Occupational History   Occupation: Secretary/administrator  Tobacco Use   Smoking status: Former    Current packs/day: 0.50    Average packs/day: 0.5 packs/day for 10.0 years (5.0 ttl pk-yrs)    Types: Cigarettes   Smokeless tobacco: Never   Tobacco comments:    Little vape   Vaping  Use   Vaping status: Every Day   Substances: Flavoring  Substance and Sexual Activity   Alcohol use: Yes    Alcohol/week: 4.0 standard drinks of alcohol    Types: 4 Glasses of wine per week    Comment: socia;   Drug use: No   Sexual activity: Yes    Birth control/protection: Other-see comments    Comment: spouse-vasectomy  Other Topics Concern   Not on file  Social History Narrative   Not on file   Social Drivers of Health   Financial Resource Strain: Low Risk  (06/12/2023)   Overall Financial Resource Strain (CARDIA)    Difficulty of Paying Living Expenses: Not hard at all  Food Insecurity: No Food Insecurity (06/12/2023)   Hunger Vital Sign    Worried About Running Out of Food in the Last Year: Never true    Ran Out of Food in the Last Year: Never true  Transportation Needs: No Transportation Needs (06/12/2023)   PRAPARE - Administrator, Civil Service (Medical): No    Lack of Transportation (Non-Medical): No  Physical Activity: Sufficiently Active (06/12/2023)   Exercise Vital Sign    Days of Exercise per Week: 5 days    Minutes of Exercise per Session: 30 min  Stress: Stress Concern Present (06/12/2023)   [employer] of Occupational Health - Occupational Stress Questionnaire    Feeling of Stress : Very much  Social Connections: Unknown (06/12/2023)   Social Connection and Isolation Panel    Frequency of Communication with Friends and Family: More than three times a week    Frequency of Social Gatherings with Friends and Family: Once a week    Attends Religious Services: Patient declined    Database administrator or Organizations: No    Attends Banker Meetings: Not on file    Marital Status: Married  Intimate Partner Violence: Unknown (05/21/2022)   Received from [facility]   HITS    Physically Hurt: Not on  file    Insult or Talk Down To: Not on file    Threaten Physical Harm: Not on file    Scream or Curse: Not on file    PHYSICAL  EXAM  GENERAL EXAM/CONSTITUTIONAL: Vitals:  Vitals:   09/19/23 1106  BP: 115/79  Pulse: 79  Resp: 16  SpO2: 95%    There is no height or weight on file to calculate BMI. Wt Readings from Last 3 Encounters:  07/12/23 183 lb (83 kg)  06/30/23 182 lb 8 oz (82.8 kg)  06/20/23 184 lb 9.6 oz (83.7 kg)   Patient is in no distress; well developed, nourished and groomed; neck is supple  MUSCULOSKELETAL: Gait, strength, tone, movements noted in Neurologic exam below  NEUROLOGIC: MENTAL STATUS:      No data to display         awake, alert, oriented to person, place and time recent and remote memory intact normal attention and concentration language fluent, comprehension intact, naming intact fund of knowledge appropriate  CRANIAL NERVE:  2nd, 3rd, 4th, 6th - Visual fields full to confrontation, extraocular muscles intact, no nystagmus 5th - facial sensation symmetric 7th - facial strength symmetric 8th - hearing intact 9th - palate elevates symmetrically, uvula midline 11th - shoulder shrug symmetric 12th - tongue protrusion midline  MOTOR:  normal bulk and tone, full strength in the BUE, BLE  SENSORY:  normal and symmetric to light touch  COORDINATION:  finger-nose-finger, fine finger movements normal  GAIT/STATION:  normal   DIAGNOSTIC DATA (LABS, IMAGING, TESTING) - I reviewed patient records, labs, notes, testing and imaging myself where available.  Lab Results  Component Value Date   WBC 8.3 06/27/2023   HGB 12.8 06/27/2023   HCT 39.1 06/27/2023   MCV 91.4 06/27/2023   PLT 254 06/27/2023      Component Value Date/Time   NA 138 06/27/2023 1111   NA 140 08/19/2019 1446   K 4.1 06/27/2023 1111   CL 104 06/27/2023 1111   CO2 23 06/27/2023 1111   GLUCOSE 93 06/27/2023 1111   BUN 10 06/27/2023 1111   BUN 12 08/19/2019 1446   CREATININE 0.82 06/27/2023 1111   CREATININE 0.81 10/29/2011 1046   CALCIUM 9.5 06/27/2023 1111   PROT 6.7 06/27/2023 1111    PROT 7.3 05/25/2017 1851   ALBUMIN 4.5 06/27/2023 1111   ALBUMIN 4.7 05/25/2017 1851   AST 18 06/27/2023 1111   ALT 17 06/27/2023 1111   ALKPHOS 69 06/27/2023 1111   BILITOT 0.2 06/27/2023 1111   BILITOT 0.3 05/25/2017 1851   GFRNONAA >60 06/27/2023 1111   GFRAA 100 08/19/2019 1446   Lab Results  Component Value Date   CHOL 236 (H) 07/15/2022   HDL 77 07/15/2022   LDLCALC 140 (H) 07/15/2022   TRIG 91 07/15/2022   Lab Results  Component Value Date   HGBA1C 5.1 03/29/2022   Lab Results  Component Value Date   VITAMINB12 409 04/11/2022   Lab Results  Component Value Date   TSH 0.603 03/26/2022    MRI Brain with and without contrast 04/06/2022 This is a normal MRI of the brain with and without contrast.   EEG 03/31/2022 This is a normal EEG recorded while drowsy and awake. No evidence of interictal epileptiform discharges. Normal EEGs, however, do not rule out epilepsy   48Hours amb eeg 07/08/2022 This is a normal 48 hours ambulatory video EEG. There were no seizures, epileptiform discharge or events captured during this recording. Normal EEGs, however  do not rule out epilepsy.   I personally reviewed brain Images and previous EEG reports.   ASSESSMENT AND PLAN  48 y.o. year old female  with history of restless leg syndrome who is presenting for follow up for her jerk like activity and episode of unresponsiveness during sleep . She had a normal MRI, normal routine and ambulatory EEG. Again I have informed patient that I do suspect these events to be nonepileptic, and I am glad she is doing better on Lamotrigine  125 mg BID. We will check a Lamotrigine  level and will increase the dose to 150 mg twice daily. Advised her to follow up with her appointment at [facility]. Return in 6 to 8 months or sooner if worse.    1. Seizure-like activity [facility])      Patient Instructions  Continue with lamotrigine  150 mg twice daily Continue your other medications Please follow with [facility] in August Return in 6 to 8 months or sooner if worse.     Orders Placed This Encounter  Procedures   Lamotrigine  level   Basic Metabolic Panel    Meds ordered this encounter  Medications   DISCONTD: lamoTRIgine  (LAMICTAL ) 150 MG tablet    Sig: Take 1 tablet (150 mg total) by mouth daily.    Dispense:  90 tablet    Refill:  3   lamoTRIgine  (LAMICTAL ) 150 MG tablet    Sig: Take 1 tablet (150 mg total) by mouth 2 (two) times daily.    Dispense:  180 tablet    Refill:  3    Return in about 6 months (around 03/21/2024).     Pastor Falling, MD 09/19/2023, 1:15 PM  [facility] [address] [place], [place] [address] [phone]

## 2023-09-20 ENCOUNTER — Ambulatory Visit: Payer: Self-pay | Admitting: Neurology

## 2023-09-20 LAB — BASIC METABOLIC PANEL WITH GFR
BUN/Creatinine Ratio: 8 — ABNORMAL LOW (ref 9–23)
BUN: 7 mg/dL (ref 6–24)
CO2: 21 mmol/L (ref 20–29)
Calcium: 9.1 mg/dL (ref 8.7–10.2)
Chloride: 101 mmol/L (ref 96–106)
Creatinine, Ser: 0.9 mg/dL (ref 0.57–1.00)
Glucose: 82 mg/dL (ref 70–99)
Potassium: 4.3 mmol/L (ref 3.5–5.2)
Sodium: 137 mmol/L (ref 134–144)
eGFR: 78 mL/min/1.73 (ref >59)

## 2023-09-20 LAB — LAMOTRIGINE LEVEL: Lamotrigine Lvl: 6.4 ug/mL (ref 2.0–20.0)

## 2023-10-23 DIAGNOSIS — R569 Unspecified convulsions: Secondary | ICD-10-CM | POA: Diagnosis not present

## 2024-01-17 DIAGNOSIS — R569 Unspecified convulsions: Secondary | ICD-10-CM | POA: Diagnosis not present

## 2024-02-24 ENCOUNTER — Telehealth: Admitting: Nurse Practitioner

## 2024-02-24 DIAGNOSIS — J40 Bronchitis, not specified as acute or chronic: Secondary | ICD-10-CM

## 2024-02-24 MED ORDER — AZITHROMYCIN 250 MG PO TABS: ORAL_TABLET | ORAL | 0 refills | Status: AC

## 2024-02-24 MED ORDER — PREDNISONE 20 MG PO TABS: 20.0000 mg | ORAL_TABLET | Freq: Every day | ORAL | 0 refills | Status: DC

## 2024-02-24 MED ORDER — BENZONATATE 200 MG PO CAPS: 200.0000 mg | ORAL_CAPSULE | Freq: Two times a day (BID) | ORAL | 0 refills | Status: AC | PRN

## 2024-02-24 NOTE — Progress Notes (Signed)
 We are sorry that you are not feeling well.  Here is how we plan to help!  Based on your presentation I believe you most likely have A cough due to bacteria.  When patients have a fever and a productive cough with a change in color or increased sputum production, we are concerned about bacterial bronchitis.  If left untreated it can progress to pneumonia.  If your symptoms do not improve with your treatment plan it is important that you contact your provider.   I have prescribed Azithromyin 250 mg: two tablets now and then one tablet daily for 4 additonal days    In addition you may use A prescription cough medication called Tessalon  Perles 100mg . You may take 1-2 capsules every 8 hours as needed for your cough.   From your responses in the eVisit questionnaire you describe inflammation in the upper respiratory tract which is causing a significant cough.  This is commonly called Bronchitis and has four common causes:   Allergies Viral Infections Acid Reflux Bacterial Infection Allergies, viruses and acid reflux are treated by controlling symptoms or eliminating the cause. An example might be a cough caused by taking certain blood pressure medications. You stop the cough by changing the medication. Another example might be a cough caused by acid reflux. Controlling the reflux helps control the cough.  USE OF BRONCHODILATOR (RESCUE) INHALERS: There is a risk from using your bronchodilator too frequently.  The risk is that over-reliance on a medication which only relaxes the muscles surrounding the breathing tubes can reduce the effectiveness of medications prescribed to reduce swelling and congestion of the tubes themselves.  Although you feel brief relief from the bronchodilator inhaler, your asthma may actually be worsening with the tubes becoming more swollen and filled with mucus.  This can delay other crucial treatments, such as oral steroid medications. If you need to use a bronchodilator  inhaler daily, several times per day, you should discuss this with your provider.  There are probably better treatments that could be used to keep your asthma under control.     HOME CARE Only take medications as instructed by your medical team. Complete the entire course of an antibiotic. Drink plenty of fluids and get plenty of rest. Avoid close contacts especially the very young and the elderly Cover your mouth if you cough or cough into your sleeve. Always remember to wash your hands A steam or ultrasonic humidifier can help congestion.   GET HELP RIGHT AWAY IF: You develop worsening fever. You become short of breath You cough up blood. Your symptoms persist after you have completed your treatment plan MAKE SURE YOU  Understand these instructions. Will watch your condition. Will get help right away if you are not doing well or get worse.  Your e-visit answers were reviewed by a board certified advanced clinical practitioner to complete your personal care plan.  Depending on the condition, your plan could have included both over the counter or prescription medications. If there is a problem please reply  once you have received a response from your provider. Your safety is important to us .  If you have drug allergies check your prescription carefully.    You can use MyChart to ask questions about today's visit, request a non-urgent call back, or ask for a work or school excuse for 24 hours related to this e-Visit. If it has been greater than 24 hours you will need to follow up with your provider, or enter a new e-Visit  to address those concerns. You will get an e-mail in the next two days asking about your experience.  I hope that your e-visit has been valuable and will speed your recovery. Thank you for using e-visits.   I have spent 5 minutes in review of e-visit questionnaire, review and updating patient chart, medical decision making and response to patient.   Tashiana Lamarca W Nicco Reaume,  NP

## 2024-02-27 ENCOUNTER — Ambulatory Visit: Payer: Self-pay

## 2024-02-27 NOTE — Telephone Encounter (Signed)
 Please see triage note and pt refusal of ED

## 2024-02-27 NOTE — Telephone Encounter (Signed)
 Called and spoke to patient and attempted to get her schedule but the her provider was booked. Patient expressed that she would go to urgent care. Patient had a bad cough during the call.

## 2024-02-27 NOTE — Telephone Encounter (Signed)
 Patient would like to be worked into the schedule today if possible.    FYI Only or Action Required?: Action required by provider: Er Refusal at this time---would like to be worked into the schedule if possible.  Patient was last seen in primary care on 07/21/2023 by Vicky Charleston, PA-C.  Called Nurse Triage reporting Chest Pain.  Symptoms began around 01/26/2024 off and on--had an E visit on 02/24/2024 and is feeling worse as of last night .  Interventions attempted: Prescription medications: Azithromycin  250mg  for 5 days, Tessalon  Perles 100mg , Prednisone  20mg  for 5 days. and Rest, hydration, or home remedies.  Symptoms are: gradually worsening.  Triage Disposition: Go to ED Now (or PCP Triage)  Patient/caregiver understands and will follow disposition?: No, wishes to speak with PCP        Copied from CRM [id number]. Topic: Clinical - Red Word Triage >> Feb 27, 2024 10:19 AM Willma SAUNDERS wrote: Red Word that prompted transfer to Nurse Triage: Patient has had a cold on and off since Thanksgiving. Has congestion, cough, fatigue and chest pain. Did a telehealth visit 12/27 and prescribed a steroid and another medication stating she has bronchitis but states isn't feeling any better. Says she can feel the congestion in her chest but isn't able to get any of it up just feels like it is stuck there. Reason for Disposition  Taking a deep breath makes pain worse  Answer Assessment - Initial Assessment Questions Day after Thanksgiving--symptoms started Sore throat, headache, fever, brain fog per patient Started feeling better for about a week and a half-- then was sick again with the same symptoms Patient had an E-visit on 02/24/2024--diagnosed with bronchitis--given antibiotic Azithromycin  250mg  for 5 days, Tessalon  Perles 100mg , Prednisone  20mg  for 5 days. Patient states she is not any better and tomorrow is the last day of medication. She states getting tired after doing small  tasks. Generalized malaise/fatigue, shortness of breath, & she states that taking a deep breath. She states that breathing in and breathing out she feels pain. She does want a provider to listen to her lungs and states that she is feeling worse as of last night despite being on antibiotics and steroids. Patient states she feels like she has chest congestion but can't cough anything up. Patient denies vomiting, diarrhea, known fever at this time but does state some nausea.  No audible wheezing heard over the phone and patient denies wheezing. Patient is advised the Emergency Room at this time but states she does not want to go to the ER at this time. She would like to be fit into the schedule if possible. Patient is advised to call us  back if anything changes or with any further questions/concerns. Patient is advised that if anything worsens to call 911. Patient verbalized understanding.  Protocols used: Chest Pain-A-AH

## 2024-02-27 NOTE — Telephone Encounter (Signed)
 Called [facility] and advised Tully of patient's symptoms and refusal of the ER at this time.

## 2024-02-27 NOTE — Telephone Encounter (Signed)
 recommend she either have OV or go to urgent care/ED to have lungs assessed and possible CXR. Sounds like she is on correct meds to treat bronchitis or pneumonia.

## 2024-02-28 ENCOUNTER — Ambulatory Visit
Admission: RE | Admit: 2024-02-28 | Discharge: 2024-02-28 | Disposition: A | Discharge status: Home / Self Care | Source: Home / Self Care

## 2024-02-28 ENCOUNTER — Ambulatory Visit (INDEPENDENT_AMBULATORY_CARE_PROVIDER_SITE_OTHER): Admitting: Radiology

## 2024-02-28 ENCOUNTER — Other Ambulatory Visit: Payer: Self-pay

## 2024-02-28 VITALS — BP 102/62 | HR 79 | Temp 98.3°F | Resp 14 | Wt 160.0 lb

## 2024-02-28 DIAGNOSIS — R051 Acute cough: Secondary | ICD-10-CM | POA: Diagnosis not present

## 2024-02-28 DIAGNOSIS — J209 Acute bronchitis, unspecified: Secondary | ICD-10-CM

## 2024-02-28 DIAGNOSIS — R0602 Shortness of breath: Secondary | ICD-10-CM | POA: Diagnosis not present

## 2024-02-28 MED ORDER — PREDNISONE 20 MG PO TABS: ORAL_TABLET | ORAL | 0 refills | Status: AC

## 2024-02-28 MED ORDER — ALBUTEROL SULFATE HFA 108 (90 BASE) MCG/ACT IN AERS: 1.0000 | INHALATION_SPRAY | Freq: Four times a day (QID) | RESPIRATORY_TRACT | 0 refills | Status: AC | PRN

## 2024-02-28 MED ORDER — HYDROCODONE BIT-HOMATROP MBR 5-1.5 MG/5ML PO SOLN: 5.0000 mL | Freq: Four times a day (QID) | ORAL | 0 refills | Status: AC | PRN

## 2024-02-28 NOTE — ED Triage Notes (Addendum)
 Pt presents with a chief complaint of SOB. Did have a tele visit on 12/27 and was dx with bronchitis. Took full dose of the prescribed steroid + antibiotic per patient description. OTC Tylenol  also taken. Medicines did not help at all. Currently rates overall pain a 4/10. Feeling intermittent sharp pains in the mid-anterior chest. No fevers. Room air O2 in triage is 98%.

## 2024-02-28 NOTE — Discharge Instructions (Addendum)
 You were seen today for concerns of a persistent cough that has been ongoing for the last few months.  We have completed a chest x-ray today in the clinic and radiology has reviewed it and did not find any evidence of pneumonia, consolidation or evidence of abnormality. Based on your symptoms as well as the testing completed today I suspect you likely do have bronchitis.  To help with managing your symptoms I am changing your steroid to a stronger, tapered regimen and sending you home with an albuterol  rescue inhaler to help with your shortness of breath and coughing particularly at night. I have sent in a script for Prednisone  taper to be taken in the morning with breakfast per the instructions on the container Remember that steroids can cause sleeplessness, irritability, increased hunger and elevated glucose levels so be mindful of these side effects. They should lessen as you progress to the lower doses of the taper. I would recommend completing the Z-Pak just to provide some antibiotic coverage in the case that you have an atypical bacteria. If you feel like your symptoms are not improving or seem to be worsening I recommend following up with your primary care provider.  If you start to have any of the following symptoms please go to the ER: Fever that is not responding to Tylenol  and ibuprofen, significant difficulty breathing, severe chest pain, neck pain or stiffness, confusion, loss of consciousness.

## 2024-02-28 NOTE — ED Provider Notes (Signed)
 [facility]    CSN: [id number] Arrival date & time: 02/28/24  [id number]      History   Chief Complaint Chief Complaint  Patient presents with   Shortness of Breath    HPI Kathryn Brown is a 48 y.o. female.   HPI  Pt is here today with concerns for recurrent coughing and SOB. She states this started the day after Thanksgiving and she has had relapsing and remitting symptoms roughly every other week since then. She reports she is having chest pains with coughing, SOB, difficulty sleeping due to discomfort. She was seen on 12/27 via telehealth and given prednisone  and zpack for bronchitis but states that her symptoms seem to be getting worse even as she is taking medications. She reports her cough is dry but she feels like there is mucus or something she needs to cough up. She states her lungs feel heavy and she is getting tired easily.  Interventions: Tylenol ,   Past Medical History:  Diagnosis Date   Abnormal bruising 05/26/2022   Chest pain    Heart murmur    Heart palpitations    History of stomach ulcers    as a child   MVP (mitral valve prolapse) 10/30/2011   Seizure (HCC)     Patient Active Problem List   Diagnosis Date Noted   Seizure-like activity (HCC) 07/12/2023   Sleep paralysis, recurrent isolated 07/12/2023   Parasomnia 07/12/2023   Adjustment reaction with anxiety and depression [F43.23] 06/30/2023   Low ferritin level 06/30/2023   Sleep disorder 06/13/2023   Myoclonus 05/26/2022   Abnormal dreams 05/26/2022   Myalgia, multiple sites 05/26/2022   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   Restless leg syndrome, uncontrolled 04/06/2022   Altered mental status, unspecified 04/06/2022   Memory loss of unknown cause 04/06/2022   Abnormal uterine bleeding (AUB) 09/21/2021   History of loop electrical excision procedure (LEEP) 09/21/2021   Vitamin D  deficiency 08/26/2019   MVP (mitral valve prolapse) 10/30/2011    Past Surgical History:  Procedure  Laterality Date   TONSILECTOMY, ADENOIDECTOMY, BILATERAL MYRINGOTOMY AND TUBES     TONSILLECTOMY      OB History     Gravida  2   Para  2   Term      Preterm      AB      Living  2      SAB      IAB      Ectopic      Multiple      Live Births               Home Medications    Prior to Admission medications  Medication Sig Start Date End Date Taking? Authorizing Provider  albuterol  (VENTOLIN  HFA) 108 (90 Base) MCG/ACT inhaler Inhale 1-2 puffs into the lungs every 6 (six) hours as needed for wheezing or shortness of breath. 02/28/24  Yes Yoneko Talerico E, PA-C  HYDROcodone  bit-homatropine (HYCODAN) 5-1.5 MG/5ML syrup Take 5 mLs by mouth every 6 (six) hours as needed for cough. 02/28/24  Yes Stellarose Cerny E, PA-C  predniSONE  (DELTASONE ) 20 MG tablet Take 60mg  PO daily x 2 days, then40mg  PO daily x 2 days, then 20mg  PO daily x 3 days 02/28/24  Yes Keegan Ducey E, PA-C  Ascorbic Acid (VITAMIN C) POWD Take 1,000 mg by mouth daily.    [provider]  benzonatate  (TESSALON ) 200 MG capsule Take 1 capsule (200 mg total) by mouth 2 (two) times daily  as needed for cough. 02/24/24   Fleming, Zelda W, NP  Ferrous Sulfate Dried (SLOW RELEASE IRON ) 45 MG TBCR Take 1 capsule by mouth daily. 06/16/23   Lucius Krabbe, NP  lamoTRIgine  (LAMICTAL ) 150 MG tablet Take 1 tablet (150 mg total) by mouth 2 (two) times daily. 09/19/23   Gregg Lek, MD  OVER THE COUNTER MEDICATION magnesium    [provider]    Family History Family History  Problem Relation Age of Onset   Heart disease Mother    Hypertension Mother    Heart disease Father    Hypertension Father    Breast cancer Neg Hx    Migraines Neg Hx     Social History Social History[1]   Allergies   Sulfa antibiotics, Compazine  [prochlorperazine ], and Topiramate    Review of Systems Review of Systems  Constitutional:  Positive for chills, diaphoresis (night sweats) and fatigue. Negative for fever.   Respiratory:  Positive for cough, chest tightness and shortness of breath.   Musculoskeletal:  Positive for myalgias.  Neurological:  Positive for light-headedness.  Psychiatric/Behavioral:  Positive for sleep disturbance.      Physical Exam Triage Vital Signs ED Triage Vitals  Encounter Vitals Group     BP 02/28/24 0837 102/62     Girls Systolic BP Percentile --      Girls Diastolic BP Percentile --      Boys Systolic BP Percentile --      Boys Diastolic BP Percentile --      Pulse Rate 02/28/24 0837 79     Resp 02/28/24 0837 14     Temp 02/28/24 0837 98.3 F (36.8 C)     Temp Source 02/28/24 0837 Oral     SpO2 02/28/24 0837 98 %     Weight 02/28/24 0837 160 lb (72.6 kg)     Height --      Head Circumference --      Peak Flow --      Pain Score 02/28/24 0847 4     Pain Loc --      Pain Education --      Exclude from Growth Chart --    No data found.  Updated Vital Signs BP 102/62 (BP Location: Right Arm)   Pulse 79   Temp 98.3 F (36.8 C) (Oral)   Resp 14   Wt 160 lb (72.6 kg)   LMP 02/15/2024 (Within Weeks)   SpO2 98%   BMI 24.33 kg/m   Visual Acuity Right Eye Distance:   Left Eye Distance:   Bilateral Distance:    Right Eye Near:   Left Eye Near:    Bilateral Near:     Physical Exam Vitals reviewed.  Constitutional:      General: She is awake.     Appearance: Normal appearance. She is well-developed and well-groomed.  HENT:     Head: Normocephalic and atraumatic.     Right Ear: Hearing, tympanic membrane and ear canal normal.     Left Ear: Hearing, tympanic membrane and ear canal normal.     Mouth/Throat:     Lips: Pink.     Mouth: Mucous membranes are moist.     Pharynx: Oropharynx is clear. Uvula midline. No pharyngeal swelling, oropharyngeal exudate, posterior oropharyngeal erythema, uvula swelling or postnasal drip.  Cardiovascular:     Rate and Rhythm: Normal rate and regular rhythm.     Pulses: Normal pulses.          Radial pulses are  2+  on the right side and 2+ on the left side.     Heart sounds: Normal heart sounds. No murmur heard.    No friction rub. No gallop.  Pulmonary:     Effort: Pulmonary effort is normal. Prolonged expiration present. No tachypnea, bradypnea, respiratory distress or retractions.     Breath sounds: Normal breath sounds. Decreased air movement present. No decreased breath sounds, wheezing, rhonchi or rales.  Musculoskeletal:     Cervical back: Normal range of motion and neck supple.  Lymphadenopathy:     Head:     Right side of head: No submental, submandibular or preauricular adenopathy.     Left side of head: No submental, submandibular or preauricular adenopathy.     Cervical:     Right cervical: No superficial cervical adenopathy.    Left cervical: No superficial cervical adenopathy.     Upper Body:     Right upper body: No supraclavicular adenopathy.     Left upper body: No supraclavicular adenopathy.  Skin:    General: Skin is warm and dry.  Neurological:     General: No focal deficit present.     Mental Status: She is alert and oriented to person, place, and time.  Psychiatric:        Mood and Affect: Mood normal.        Behavior: Behavior normal. Behavior is cooperative.      UC Treatments / Results  Labs (all labs ordered are listed, but only abnormal results are displayed) Labs Reviewed - No data to display  EKG   Radiology No results found.   Procedures Procedures (including critical care time)  Medications Ordered in UC Medications - No data to display  Initial Impression / Assessment and Plan / UC Course  I have reviewed the triage vital signs and the nursing notes.  Pertinent labs & imaging results that were available during my care of the patient were reviewed by me and considered in my medical decision making (see chart for details).      Final Clinical Impressions(s) / UC Diagnoses   Final diagnoses:  Acute cough  SOB (shortness of breath)   Acute bronchitis, unspecified organism   Patient is here today with concerns for persistent cough that is been ongoing for the last few months.  Physical exam reveals slightly prolonged expiration and decreased air movement.  Vitals are largely within normal limits.  Chest x-ray did not show evidence of consolidation or pneumonia.  At this time I believe the patient likely has bronchitis.  Will start prolonged prednisone  taper as well as send patient home with albuterol  inhaler and cough syrup to assist with her symptoms.  Recommend that she completes the Z-Pak that she was given by previous provider to assist with antibiotic coverage.  Reviewed appropriate OTC medications to further provide symptomatic relief.  ED and return precautions reviewed and provided in AVS.  Follow-up as needed.    Discharge Instructions      You were seen today for concerns of a persistent cough that has been ongoing for the last few months.  We have completed a chest x-ray today in the clinic and radiology has reviewed it and did not find any evidence of pneumonia, consolidation or evidence of abnormality. Based on your symptoms as well as the testing completed today I suspect you likely do have bronchitis.  To help with managing your symptoms I am changing your steroid to a stronger, tapered regimen and sending you home with an albuterol   rescue inhaler to help with your shortness of breath and coughing particularly at night. I have sent in a script for Prednisone  taper to be taken in the morning with breakfast per the instructions on the container Remember that steroids can cause sleeplessness, irritability, increased hunger and elevated glucose levels so be mindful of these side effects. They should lessen as you progress to the lower doses of the taper. I would recommend completing the Z-Pak just to provide some antibiotic coverage in the case that you have an atypical bacteria. If you feel like your symptoms are not  improving or seem to be worsening I recommend following up with your primary care provider.  If you start to have any of the following symptoms please go to the ER: Fever that is not responding to Tylenol  and ibuprofen, significant difficulty breathing, severe chest pain, neck pain or stiffness, confusion, loss of consciousness.     ED Prescriptions     Medication Sig Dispense Auth. Provider   predniSONE  (DELTASONE ) 20 MG tablet Take 60mg  PO daily x 2 days, then40mg  PO daily x 2 days, then 20mg  PO daily x 3 days 13 tablet Brycelynn Stampley E, PA-C   albuterol  (VENTOLIN  HFA) 108 (90 Base) MCG/ACT inhaler Inhale 1-2 puffs into the lungs every 6 (six) hours as needed for wheezing or shortness of breath. 8 g Amairani Shuey E, PA-C   HYDROcodone  bit-homatropine (HYCODAN) 5-1.5 MG/5ML syrup Take 5 mLs by mouth every 6 (six) hours as needed for cough. 60 mL Caliah Kopke E, PA-C      PDMP not reviewed this encounter.      [1]  Social History Tobacco Use   Smoking status: Former    Current packs/day: 0.50    Average packs/day: 0.5 packs/day for 10.0 years (5.0 ttl pk-yrs)    Types: Cigarettes   Smokeless tobacco: Never   Tobacco comments:    Little vape   Vaping Use   Vaping status: Every Day   Substances: Flavoring  Substance Use Topics   Alcohol use: Yes    Alcohol/week: 4.0 standard drinks of alcohol    Types: 4 Glasses of wine per week    Comment: socia;   Drug use: No     Marylene Rocky BRAVO, PA-C 03/04/24 [id number]  "

## 2024-03-13 ENCOUNTER — Ambulatory Visit: Admitting: Family

## 2024-05-13 ENCOUNTER — Ambulatory Visit: Admitting: Neurology

## 2024-05-17 IMAGING — MG MM DIGITAL SCREENING BILAT W/ TOMO AND CAD
8 series · 8 of 24 positions shown · non-contrast
Comparison: Previous exam(s).

CLINICAL DATA: Screening.

EXAM:
DIGITAL SCREENING BILATERAL MAMMOGRAM WITH TOMOSYNTHESIS AND CAD
TECHNIQUE: Bilateral screening digital craniocaudal and mediolateral oblique
mammograms were obtained. Bilateral screening digital breast
tomosynthesis was performed. The images were evaluated with
computer-aided detection.

[L MLO synth-2D]
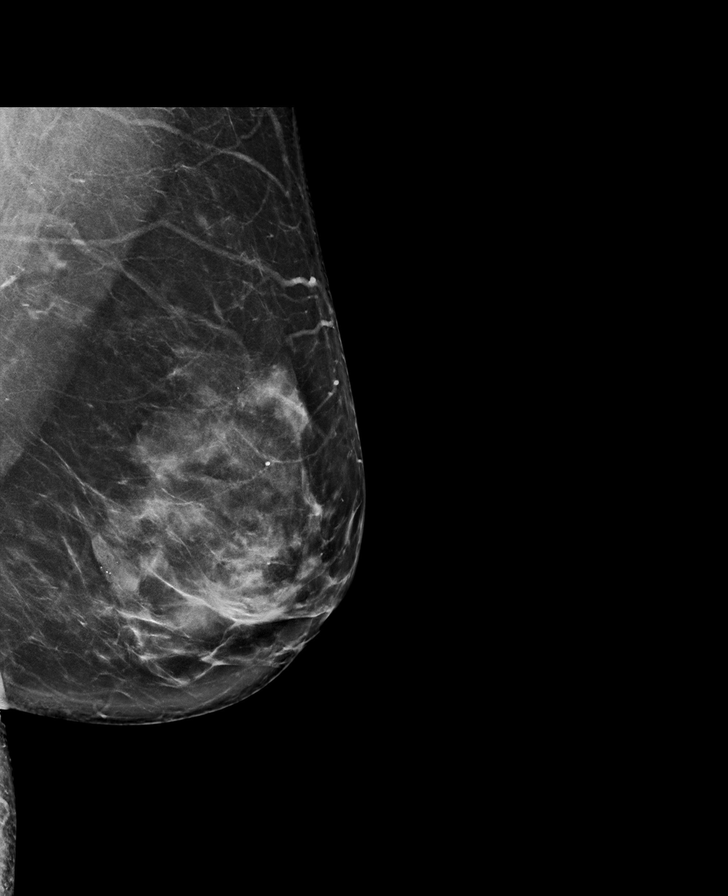

[R CC synth-2D]
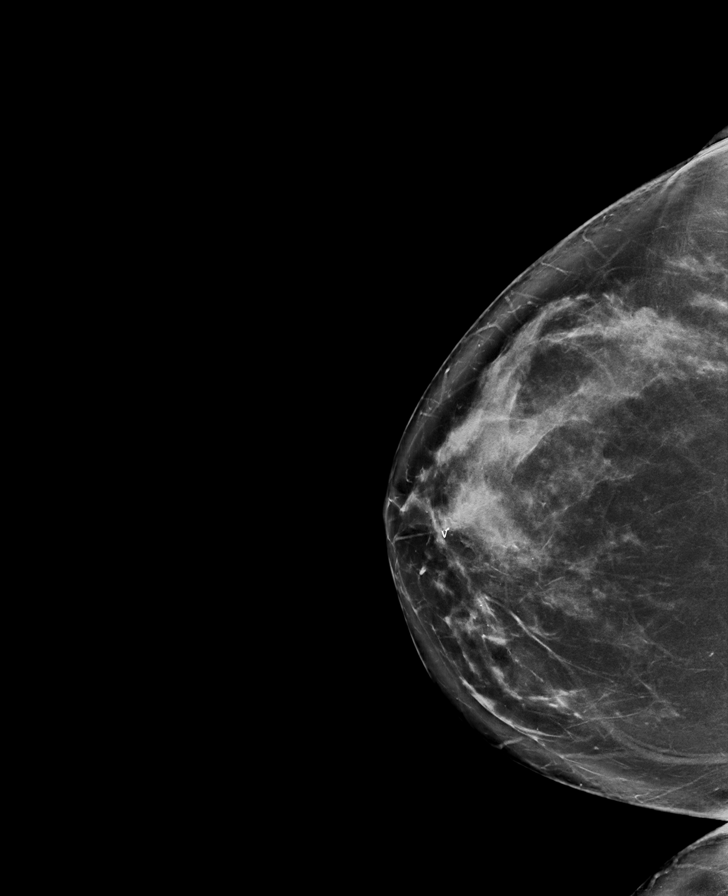

[L CC synth-2D]
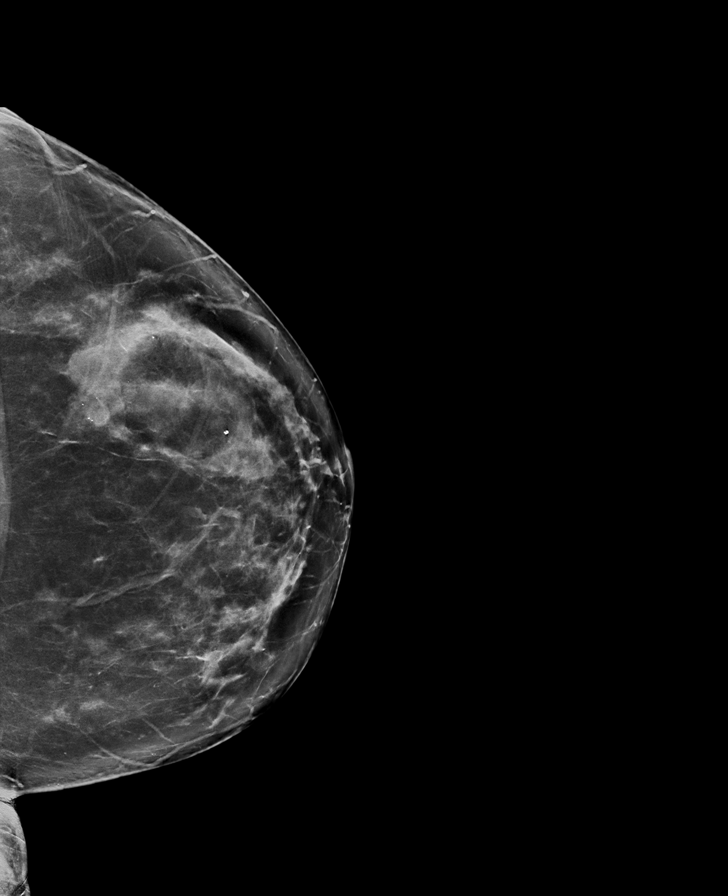

[R MLO synth-2D]
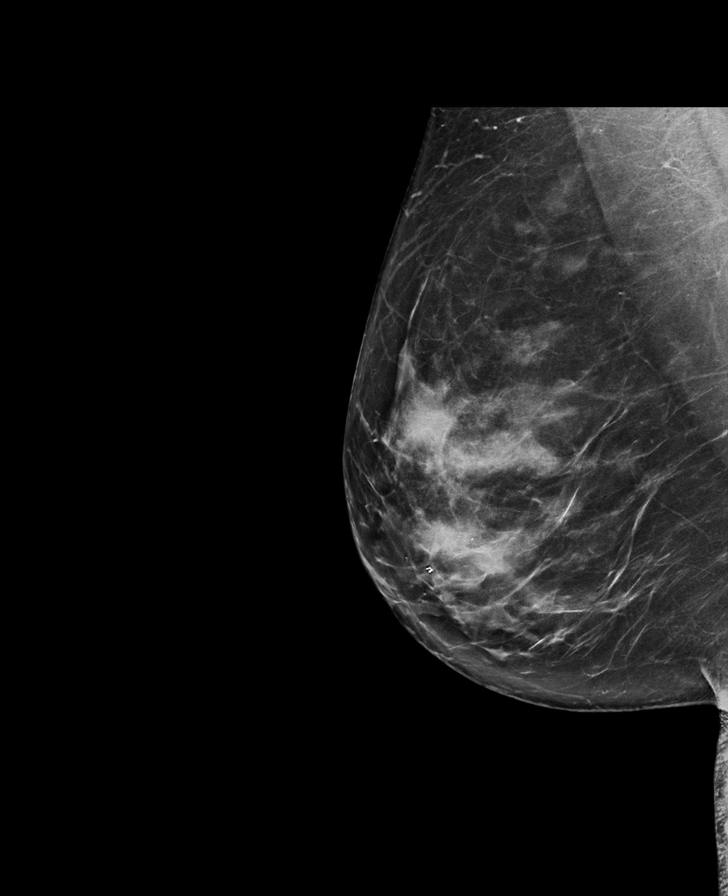

[R CC tomo · tomo slice 44/87.0]
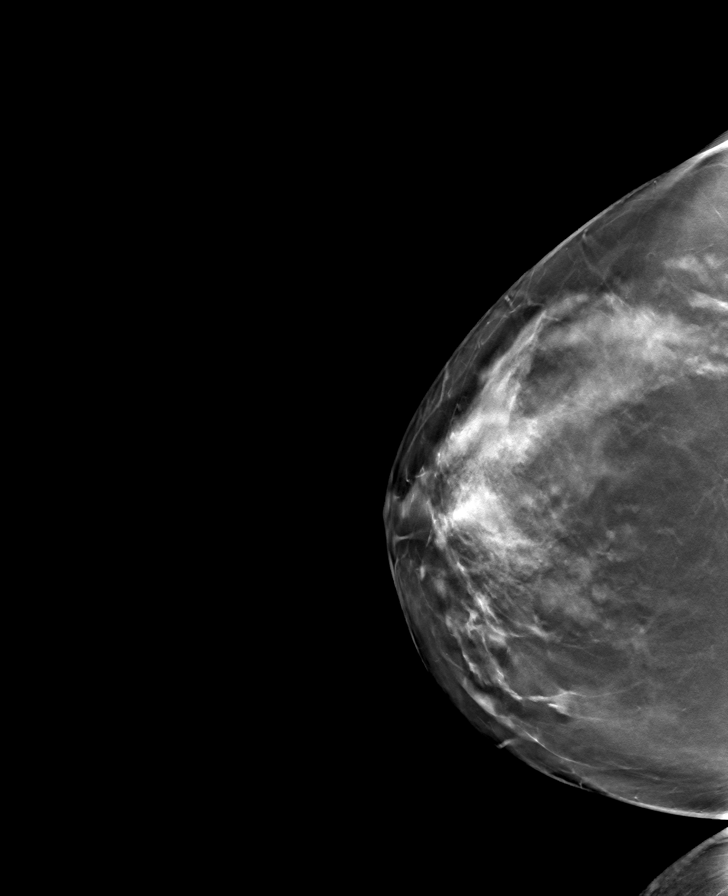

[L CC tomo · tomo slice 42/83.0]
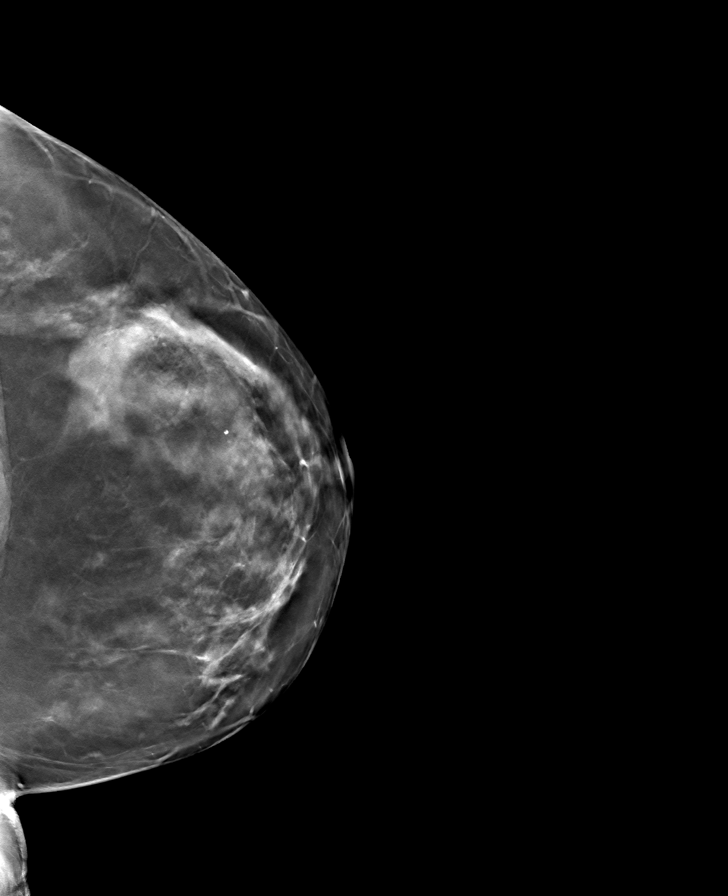

[L MLO tomo · tomo slice 45/90.0]
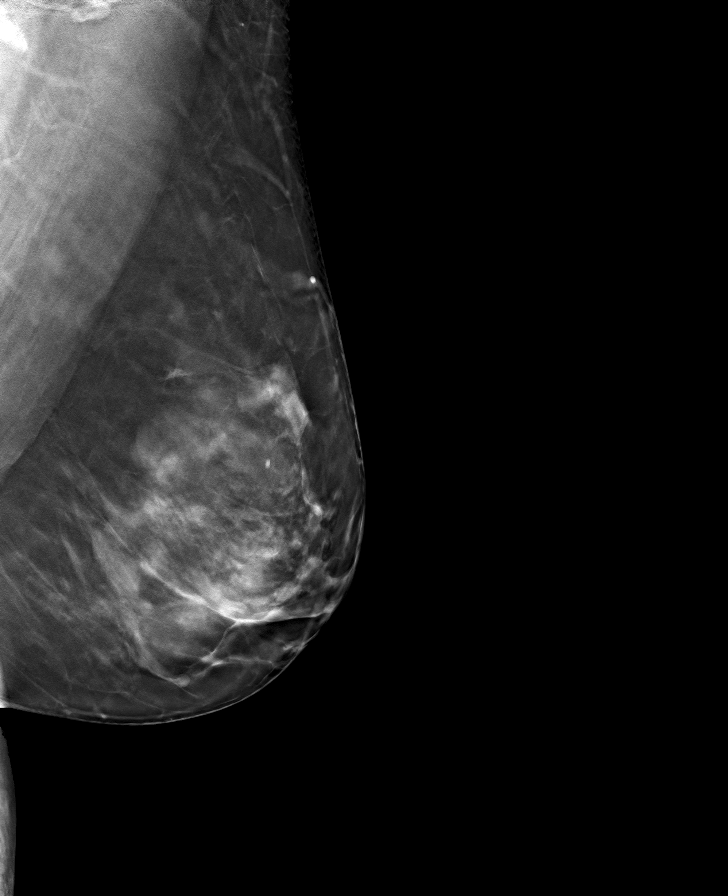

[R MLO tomo · tomo slice 45/90.0]
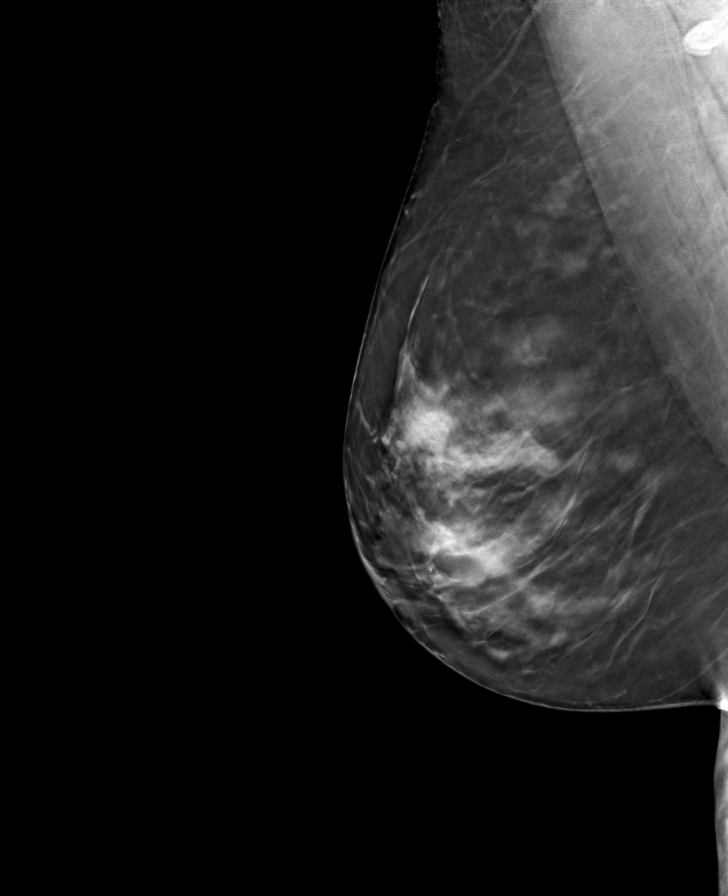

[8 of 24 positions shown; findings below may reference images not displayed]

ACR Breast Density Category c: The breast tissue is heterogeneously
dense, which may obscure small masses.
FINDINGS: In the left breast, a possible asymmetry warrants further
evaluation. In the right breast, no findings suspicious for
malignancy.
IMPRESSION: Further evaluation is suggested for possible asymmetry in the left
breast.

RECOMMENDATION:
Diagnostic mammogram and possibly ultrasound of the left breast.
(Code:[id number])

The patient will be contacted regarding the findings, and additional
imaging will be scheduled.

BI-RADS CATEGORY  0: Incomplete. Need additional imaging evaluation
and/or prior mammograms for comparison.
